# Patient Record
Sex: Female | Born: 1950 | Race: White | Hispanic: No | Marital: Married | Smoking: Never smoker
Health system: Southern US, Community
[De-identification: ages and names within clinical notes are randomized; demographics above are authoritative.]

## PROBLEM LIST (undated history)

## (undated) DIAGNOSIS — E119 Type 2 diabetes mellitus without complications: Secondary | ICD-10-CM

## (undated) DIAGNOSIS — J189 Pneumonia, unspecified organism: Secondary | ICD-10-CM

## (undated) DIAGNOSIS — R945 Abnormal results of liver function studies: Secondary | ICD-10-CM

## (undated) DIAGNOSIS — I10 Essential (primary) hypertension: Secondary | ICD-10-CM

## (undated) DIAGNOSIS — G459 Transient cerebral ischemic attack, unspecified: Secondary | ICD-10-CM

## (undated) DIAGNOSIS — R7989 Other specified abnormal findings of blood chemistry: Secondary | ICD-10-CM

## (undated) DIAGNOSIS — IMO0002 Reserved for concepts with insufficient information to code with codable children: Secondary | ICD-10-CM

## (undated) DIAGNOSIS — R51 Headache: Secondary | ICD-10-CM

## (undated) DIAGNOSIS — R4701 Aphasia: Secondary | ICD-10-CM

## (undated) DIAGNOSIS — R112 Nausea with vomiting, unspecified: Secondary | ICD-10-CM

## (undated) DIAGNOSIS — Z9889 Other specified postprocedural states: Secondary | ICD-10-CM

## (undated) DIAGNOSIS — M436 Torticollis: Secondary | ICD-10-CM

## (undated) DIAGNOSIS — R252 Cramp and spasm: Secondary | ICD-10-CM

## (undated) DIAGNOSIS — E6609 Other obesity due to excess calories: Secondary | ICD-10-CM

## (undated) DIAGNOSIS — E7800 Pure hypercholesterolemia, unspecified: Secondary | ICD-10-CM

## (undated) HISTORY — DX: Other specified abnormal findings of blood chemistry: R79.89

## (undated) HISTORY — DX: Other obesity due to excess calories: E66.09

## (undated) HISTORY — PX: ANKLE FRACTURE SURGERY: SHX122

## (undated) HISTORY — DX: Torticollis: M43.6

## (undated) HISTORY — PX: KNEE ARTHROSCOPY: SHX127

## (undated) HISTORY — DX: Reserved for concepts with insufficient information to code with codable children: IMO0002

## (undated) HISTORY — PX: BREAST BIOPSY: SHX20

## (undated) HISTORY — DX: Aphasia: R47.01

## (undated) HISTORY — PX: LAPAROSCOPY: SHX197

## (undated) HISTORY — DX: Essential (primary) hypertension: I10

## (undated) HISTORY — PX: TONSILLECTOMY: SUR1361

## (undated) HISTORY — PX: DILATION AND CURETTAGE OF UTERUS: SHX78

## (undated) HISTORY — DX: Cramp and spasm: R25.2

## (undated) HISTORY — PX: PILONIDAL CYST EXCISION: SHX744

## (undated) HISTORY — DX: Abnormal results of liver function studies: R94.5

## (undated) HISTORY — DX: Pure hypercholesterolemia, unspecified: E78.00

---

## 1998-04-08 ENCOUNTER — Other Ambulatory Visit: Admission: RE | Admit: 1998-04-08 | Discharge: 1998-04-08 | Payer: Self-pay | Admitting: Obstetrics and Gynecology

## 1999-02-06 HISTORY — PX: ANTERIOR CERVICAL DECOMP/DISCECTOMY FUSION: SHX1161

## 1999-02-23 ENCOUNTER — Encounter: Admission: RE | Admit: 1999-02-23 | Discharge: 1999-05-24 | Payer: Self-pay | Admitting: Gynecology

## 1999-04-10 ENCOUNTER — Other Ambulatory Visit: Admission: RE | Admit: 1999-04-10 | Discharge: 1999-04-10 | Payer: Self-pay | Admitting: Obstetrics and Gynecology

## 1999-10-25 ENCOUNTER — Encounter: Admission: RE | Admit: 1999-10-25 | Discharge: 1999-10-25 | Payer: Self-pay | Admitting: Orthopedic Surgery

## 1999-10-25 ENCOUNTER — Encounter: Payer: Self-pay | Admitting: Orthopedic Surgery

## 1999-11-17 ENCOUNTER — Encounter: Payer: Self-pay | Admitting: Neurosurgery

## 1999-11-17 ENCOUNTER — Ambulatory Visit (HOSPITAL_COMMUNITY): Admission: RE | Admit: 1999-11-17 | Discharge: 1999-11-18 | Payer: Self-pay | Admitting: Neurosurgery

## 1999-11-28 ENCOUNTER — Encounter: Payer: Self-pay | Admitting: Neurosurgery

## 1999-11-28 ENCOUNTER — Encounter: Admission: RE | Admit: 1999-11-28 | Discharge: 1999-11-28 | Payer: Self-pay | Admitting: Neurosurgery

## 2000-01-09 ENCOUNTER — Encounter: Admission: RE | Admit: 2000-01-09 | Discharge: 2000-01-09 | Payer: Self-pay | Admitting: Neurosurgery

## 2000-01-09 ENCOUNTER — Encounter: Payer: Self-pay | Admitting: Neurosurgery

## 2000-04-01 ENCOUNTER — Encounter: Admission: RE | Admit: 2000-04-01 | Discharge: 2000-04-01 | Payer: Self-pay | Admitting: Neurosurgery

## 2000-04-01 ENCOUNTER — Encounter: Payer: Self-pay | Admitting: Neurosurgery

## 2000-04-18 ENCOUNTER — Other Ambulatory Visit: Admission: RE | Admit: 2000-04-18 | Discharge: 2000-04-18 | Payer: Self-pay | Admitting: Obstetrics and Gynecology

## 2000-05-14 ENCOUNTER — Encounter: Payer: Self-pay | Admitting: Neurosurgery

## 2000-05-14 ENCOUNTER — Encounter: Admission: RE | Admit: 2000-05-14 | Discharge: 2000-05-14 | Payer: Self-pay | Admitting: Neurosurgery

## 2001-05-30 ENCOUNTER — Encounter: Payer: Self-pay | Admitting: Orthopedic Surgery

## 2001-05-30 ENCOUNTER — Encounter: Payer: Self-pay | Admitting: Emergency Medicine

## 2001-05-30 ENCOUNTER — Inpatient Hospital Stay (HOSPITAL_COMMUNITY): Admission: EM | Admit: 2001-05-30 | Discharge: 2001-05-31 | Payer: Self-pay | Admitting: Emergency Medicine

## 2001-12-22 ENCOUNTER — Other Ambulatory Visit: Admission: RE | Admit: 2001-12-22 | Discharge: 2001-12-22 | Payer: Self-pay | Admitting: *Deleted

## 2002-06-29 HISTORY — PX: CARDIOVASCULAR STRESS TEST: SHX262

## 2003-02-17 ENCOUNTER — Other Ambulatory Visit: Admission: RE | Admit: 2003-02-17 | Discharge: 2003-02-17 | Payer: Self-pay | Admitting: *Deleted

## 2004-03-02 ENCOUNTER — Other Ambulatory Visit: Admission: RE | Admit: 2004-03-02 | Discharge: 2004-03-02 | Payer: Self-pay | Admitting: *Deleted

## 2004-05-24 ENCOUNTER — Ambulatory Visit (HOSPITAL_COMMUNITY): Admission: RE | Admit: 2004-05-24 | Discharge: 2004-05-24 | Payer: Self-pay | Admitting: Gastroenterology

## 2004-08-28 ENCOUNTER — Encounter: Admission: RE | Admit: 2004-08-28 | Discharge: 2004-08-28 | Payer: Self-pay | Admitting: *Deleted

## 2005-02-05 HISTORY — PX: BREAST BIOPSY: SHX20

## 2005-04-24 ENCOUNTER — Other Ambulatory Visit: Admission: RE | Admit: 2005-04-24 | Discharge: 2005-04-24 | Payer: Self-pay | Admitting: Obstetrics & Gynecology

## 2005-07-17 ENCOUNTER — Encounter: Admission: RE | Admit: 2005-07-17 | Discharge: 2005-07-17 | Payer: Self-pay | Admitting: Obstetrics & Gynecology

## 2005-09-11 ENCOUNTER — Encounter: Admission: RE | Admit: 2005-09-11 | Discharge: 2005-09-11 | Payer: Self-pay | Admitting: Obstetrics & Gynecology

## 2005-09-21 ENCOUNTER — Encounter: Admission: RE | Admit: 2005-09-21 | Discharge: 2005-09-21 | Payer: Self-pay | Admitting: Obstetrics & Gynecology

## 2005-09-26 ENCOUNTER — Encounter: Admission: RE | Admit: 2005-09-26 | Discharge: 2005-09-26 | Payer: Self-pay | Admitting: Obstetrics & Gynecology

## 2005-09-26 ENCOUNTER — Encounter (INDEPENDENT_AMBULATORY_CARE_PROVIDER_SITE_OTHER): Payer: Self-pay | Admitting: Specialist

## 2006-04-01 ENCOUNTER — Encounter: Admission: RE | Admit: 2006-04-01 | Discharge: 2006-04-01 | Payer: Self-pay | Admitting: Obstetrics & Gynecology

## 2006-05-10 ENCOUNTER — Other Ambulatory Visit: Admission: RE | Admit: 2006-05-10 | Discharge: 2006-05-10 | Payer: Self-pay | Admitting: *Deleted

## 2006-11-07 ENCOUNTER — Encounter: Admission: RE | Admit: 2006-11-07 | Discharge: 2006-11-07 | Payer: Self-pay | Admitting: Obstetrics & Gynecology

## 2007-05-16 ENCOUNTER — Encounter: Admission: RE | Admit: 2007-05-16 | Discharge: 2007-05-16 | Payer: Self-pay | Admitting: Obstetrics & Gynecology

## 2007-05-26 ENCOUNTER — Other Ambulatory Visit: Admission: RE | Admit: 2007-05-26 | Discharge: 2007-05-26 | Payer: Self-pay | Admitting: Obstetrics and Gynecology

## 2007-12-18 ENCOUNTER — Encounter: Admission: RE | Admit: 2007-12-18 | Discharge: 2007-12-18 | Payer: Self-pay | Admitting: Obstetrics & Gynecology

## 2008-01-13 ENCOUNTER — Encounter: Admission: RE | Admit: 2008-01-13 | Discharge: 2008-01-13 | Payer: Self-pay | Admitting: Orthopedic Surgery

## 2008-06-08 ENCOUNTER — Other Ambulatory Visit: Admission: RE | Admit: 2008-06-08 | Discharge: 2008-06-08 | Payer: Self-pay | Admitting: Obstetrics & Gynecology

## 2008-12-22 ENCOUNTER — Encounter: Admission: RE | Admit: 2008-12-22 | Discharge: 2008-12-22 | Payer: Self-pay | Admitting: Obstetrics & Gynecology

## 2009-12-14 ENCOUNTER — Ambulatory Visit: Payer: Self-pay | Admitting: Cardiology

## 2009-12-16 ENCOUNTER — Ambulatory Visit: Payer: Self-pay | Admitting: Cardiology

## 2010-01-24 ENCOUNTER — Encounter
Admission: RE | Admit: 2010-01-24 | Discharge: 2010-01-24 | Payer: Self-pay | Source: Home / Self Care | Attending: Obstetrics & Gynecology | Admitting: Obstetrics & Gynecology

## 2010-02-26 ENCOUNTER — Encounter: Payer: Self-pay | Admitting: Obstetrics & Gynecology

## 2010-05-11 ENCOUNTER — Other Ambulatory Visit: Payer: Self-pay | Admitting: *Deleted

## 2010-05-11 DIAGNOSIS — G4700 Insomnia, unspecified: Secondary | ICD-10-CM

## 2010-05-11 MED ORDER — ZOLPIDEM TARTRATE 10 MG PO TABS: ORAL_TABLET | ORAL | Status: DC

## 2010-05-11 NOTE — Telephone Encounter (Signed)
Refilled meds per fax request. Faxed back 

## 2010-06-23 NOTE — H&P (Signed)
[place]  Patient:    Christina Wong, Christina Wong                       MRN: [id number] Adm. Date:  [id number] Disc. Date: [id number] Attending:  Coletta Memos                         History and Physical  ADMISSION DIAGNOSES:   Displaced disk, C6-7, left, spondylosis without myelopathy, left C7 radiculopathy.  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  She has had pain in the left upper extremity and neck for the last three to four months.  The pain did not subside since its onset.  It involved the entire left upper extremity. Numbness most noticeably in the index finger and also in the middle finger on the left side, none on the right side.  No problems on the right side of her neck.  She has had no bowel or bladder difficulties.  She has had physical therapy which included traction and ultrasound without pain relief.  She has taken Vicodin and Vioxx for this without relief.  PAST MEDICAL HISTORY:  Excellent.  She has had no previous operations.  ALLERGIES:  She does not have drug allergies.  MEDICATIONS:  Zyrtec, Nasonex, Vioxx and Vicodin.  FAMILY HISTORY:  Mother, age 40, is in good health and has heart problems. Father is deceased.  Diabetes, hypertension and cerebrovascular accidents are present in her family history.  SOCIAL HISTORY:  She does not smoke.  She does drink socially and does not use illicit drugs.  She is 5 feet 4 inches tall and weighs 190 pounds with a pulse of 92.  REVIEW OF SYSTEMS:  Positive for arm weakness.  She denies constitutional, eye, ears, nose, throat, mouth, cardiovascular, respiratory, gastrointestinal, genitourinary, skin, neurological, psychiatric, endocrine, hematologic and allergic problems.  PHYSICAL EXAMINATION:  NEUROLOGIC:  She is alert and oriented x4 and answers all questions appropriately.  Memory, language and attention span are normal.   She is well kempt and in no distress.  Cranial nerves II-XII are normal.  Normal strength in her right upper extremity and both lower extremities.  Weakness at the left triceps and wrist extensors at 4+/5.  Deep tendon reflexes are 3+ at the biceps, brachioradialis and right triceps, 1+ at the left triceps, 2+ at the knees and ankles.  No clonus and minimal Hoffman sign on the right side and none on the left.  Toes are downgoing in a plantar simulation.  She has a negative Romberg status.  She can tandem walk easily. Gait is normal.  Muscle tone, bulk and coordination are normal.  NECK:  No cervical masses or bruits are identified.  LUNGS:  Lung fields are clear bilaterally.  HEART:  Regular rate and rhythm, no murmurs or rubs.  Pulses are good at the wrists and feet bilaterally.  LABORATORY DATA:  MRI:  Shows a displaced disk on the left side of C6-7. She has some spondylitic changes associated with the disk.  It causes foraminal stenosis at the left C7 nerve root entrance.  The rest of the neck looks quite good.  No other herniated disks nor abnormalities in the spinal cord, nor is there any cord compression.  Christina Wong has tried conservative treatment without success.  She has given this problem also enough time without there being improvement.  My  recommendation therefore is for operative decompression via an anterior cervical discectomy and arthrodesis.  We discussed steroids and Christina Wong did try a steroid taper.  She did not gain significant improvement from that. She does have profound weakness and I think in that sense she should get an operation much sooner than later.  We discussed the risks and benefits of an anterior cervical discectomy and arthrodesis that included bleeding, infection, no pain relief, bowel or bladder dysfunction, paralysis, arm weakness, leg weakness, fusion failure, hardware failure, recurring laryngeal nerve injury which causes hoarseness.  She  understands and wishes to proceed. D:  11/17/99 TD:  11/17/99 Job: [id number]

## 2010-06-23 NOTE — Op Note (Signed)
[place]  Patient:    [name], [name]                       MRN: [id number] Adm. Date:  [id number] Disc. Date: [id number] Attending:  Coletta Memos                           Operative Report  PREOPERATIVE DIAGNOSIS:  Displaced disk, C6-7.  PREOPERATIVE DIAGNOSES: 1. Spondylosis with displaced disk at C6-7 on the left. 2. Left C7 radiculopathy.  SURGEON:  Kyle L. Franky Macho, M.D.  ASSISTANT:  Danae Orleans. Venetia Maxon, M.D.  PROCEDURES: 1. Anterior cervical diskectomy, C6-7. 2. Arthrodesis of C6-7 with 7 mm allograft and 18 mm CLSP Synthes plate with    three 4 x 14 mm self-drilling screws and one 4 x 4.5 mm 14 mm screw.  COMPLICATIONS:  None.  INDICATIONS:  Christina Wong is a ______ with left upper extremity pain.  An MRI showed a displaced disk at C6-7 on the left side.  She had already gone through cervical traction and physical therapy and conservative over the last three months.  It did not improve and therefore recommended and she agreed to undergo anterior cervical diskectomy and arthrodesis.  OPERATIVE NOTE:  Christina Wong was brought to the operating room, intubated, and placed under general anesthesia without difficulty.  Her neck was prepped and she was draped in a sterile fashion.  Infiltrated the proposed skin incision with 0.5% lidocaine and 1:200,000 strength epinephrine.  I made a skin incision was a #10 blade and took this down to the platysma.  I then dissected with Metzenbaum scissors both proximally and caudally above the platysma.  I opened the platysma in a horizontal fashion.  I then dissected underneath the platysma both rostrally and caudally.  I was able to retract the medial structures medially and the sternocleidomastoid laterally.  I felt the anterior cervical spine and then used hand-held retractors.  In addition to that, I removed soft tissue using a peanut sponge.  When it adequately exposed the anterior cervical spine, I  placed the needle in what I thought was C6-7. X-ray was quite difficult as she had a large neck and it was unclear where the needle was.  So I placed the needle at the level above.  When another x-ray was shown to be a C5-6, I therefore knew that this needed to go into where the original actually was placed.  I then opened the disk with a #15 blade and took out disk material at C6-7.  I initially grasped the fascia using pituitary rongeurs, curets, and Kerrison punches.  I placed a distracting pin at C6 and one at C7, distracted the disk space, brought the microscope into position, and continued with the procedure.  She had a significant amount of spondylitic disease present at C6-7 and I used the drill to remove bone spurs along with the Kerrison to remove bone spurs.  I removed a very thickened posterior longitudinal ligament after opening of the nerve with Kerrison punches.  I then decompressed the C7 nerve roots bilaterally in a generous fashion.  When I felt that there was adequate decompression, I then placed a 7 mm bone graft.  I placed a small Synthes plate initially using four 14 mm locking screws.  I took an x-ray, but there was a significant of oozing coming from the incision.  When I explored it again using hand-held  retractors, it appeared to be coming from the disk space.  At that point, I removed that plate and inspected the disk space.  There was no arterial bleeding, it was just venous ooze coming mainly from the bone.  I used a bit of bone wax and some Gelfoam.  In time, the bleeding stopped.  When I attempted to place the same plate back on, the inferior left-sided screw no longer had good purchase. I then had to use another plate.  I tried first a ______, but that also did not obtain good purchase.  I then used a different plate, the CLSP plate with self-drilling screws, 18 mm.  This plate was secured well.  I took an x-ray and it showed the plate to be in good position  as was the bone plug.  I irrigated the wound and then closed the wound in layered fashion using Vicryl sutures.  A sterile dressing was applied as were Steri-Strips for the skin. She tolerated the procedure well. DD:  11/17/99 TD:  11/19/99 Job: [id number]

## 2010-06-23 NOTE — Op Note (Signed)
[facility]  Patient:    Christina Wong, Christina Wong Visit Number: [id number] MRN: [id number]          Service Type: SUR Location: 4W [id number] 01 Attending Physician:  Cornell Barman Dictated by:   Lenard Galloway Chaney Malling, M.D. Proc. Date: 05/30/01 Admit Date:  05/30/2001 Discharge Date: 05/31/2001                             Operative Report  PREOPERATIVE DIAGNOSIS:  _______ medial malleolar fracture left ankle.  POSTOPERATIVE DIAGNOSIS:  _______ medial malleolar fracture left ankle.  OPERATION:  Complicated open reduction internal fixation primary medial malleolar fracture left ankle using a lateral six-hole sideplate; two cannulated medial malleolar screws and a large anteroposterior screw into the posterior tibial fragment.  SURGEON:  Lenard Galloway. Chaney Malling, M.D.  ANESTHESIA:  General.  DESCRIPTION OF PROCEDURE:  The patient was placed on the operating room table in supine position. _______ tourniquet about the left upper thigh. The entire left lower extremity was prepped with Duraprep and draped out in the usual manner. The leg was then wrapped up in Esmarch and tourniquet was elevated. Incision made on the medial malleolus and carried proximally. The medial malleolar fracture was seen and this was displaced. The ankle was extremely unstable. Multiple small fragments were seen about the medial malleolus and these were extracted. Bony fragments were seen in the ankle and these were removed. There was a huge posterior lip fracture which represented at least 30% to 40% of the joint. This was markedly displaced proximally. Posterior tibial tendon was identified, isolated, and stripped off the posterior malleolar fragment. A fragment could be elevated with traction on the ankle. The fragment could be brought down into an anatomic position. This was held in anatomic position and a guidepin was placed from anterior to posterior through the posterior lip  fragment. This was then drilled and appropriate length cannulated screw was passed over the fragment and tightened up. Once this was done, this stabilized the posterior fragment beautifully and there was an anatomic reduction of the roof of the distal talus. Again, the ankle was irrigated with copious amounts of antibiotic solution. Debris was removed. The medial malleolar fracture was then reduced anatomically. Two guidepins were placed across the fracture site and appropriate length cannulated screws were inserted. This was all done under radiographic control using the C-arm. The rent in the posterior tibial tendon sheath was then repaired. Attention was then turned to the lateral side of the ankle. _______ incision made starting above the fracture _______ the fibula and carried down to the tip of the fibula. Skin edges were retracted. Great care was taken to avoid injury to the superficial cutaneous nerves. The fracture was seen. This was reduced anatomically and held in position with a bone clamp. A six-hole sideplate was placed over the lateral side of the fibula. Drill hole was placed and appropriate length screws were applied. This gave excellent stabilization and immobilization to the fracture. This was also held in anatomic position. X-rays were taken once again and anatomic reduction of all three fractures was accomplished very nicely. The wound was irrigated with copious amounts of antibiotic solution again. Vicryl was used to close the subcutaneous tissue and stainless steel staples used to close the skin. Large bulky pressure dressing and a sugar tong splint was applied and the patient returned to recovery in excellent condition.  DRAINS:  None.  COMPLICATIONS:  None. Dictated by:  Rodney A. Chaney Malling, M.D. Attending Physician:  Cornell Barman DD:  05/30/01 TD:  05/31/01 Job: [id number]

## 2010-06-23 NOTE — Op Note (Signed)
NAME:  Christina Wong, Christina Wong                ACCOUNT NO.:  [id number]   MEDICAL RECORD NO.:  [id number]          PATIENT TYPE:  AMB   LOCATION:  ENDO                         FACILITY:  [facility]   PHYSICIAN:  Anselmo Rod, M.D.  DATE OF BIRTH:  05-09-1950   DATE OF PROCEDURE:  05/24/2004  DATE OF DISCHARGE:                                 OPERATIVE REPORT   PROCEDURE PERFORMED:  Screening colonoscopy.   ENDOSCOPIST:  Charna Elizabeth, M.D.   INSTRUMENT USED:  Olympus video colonoscope.   INDICATIONS FOR PROCEDURE:  A 60 year old white female undergoing screening  colonoscopy.  Rule out colonic polyps, masses, etc.   PREPROCEDURE PREPARATION:  Informed consent was procured from the patient.  The patient was fasted for eight hours prior to the procedure and prepped  with a bottle of magnesium citrate and a gallon of GoLYTELY the night prior  to the procedure.  The risks and benefits of the procedure including a 10%  miss rate of cancer and polyps was discussed with the patient as well.   PREPROCEDURE PHYSICAL:  The patient had stable vital signs.  Neck supple.  Chest clear to auscultation.  S1 and S2 regular.  Abdomen soft with normal  bowel sounds.   DESCRIPTION OF PROCEDURE:  The patient was placed in left lateral decubitus  position and sedated with 80 mg of Demerol and 7.5 mg of Versed in slow  incremental doses.  Once the patient was adequately sedated and maintained  on low flow oxygen and continuous cardiac monitoring, the Olympus video  colonoscope was advanced from the rectum to the cecum.  The appendicular  orifice and ileocecal valve were clearly visualized and photographed.  There  was some stool in the right colon.  Multiple washes were done. The terminal  ileum appeared healthy and without lesions.  Retroflexion in the rectum  showed small internal hemorrhoids.  No masses, polyps, erosions, ulcerations  or diverticula were seen.  The patient tolerated the procedure well without  complication.   IMPRESSION:  1.  Normal colonoscopy up to the terminal ileum except for small internal      hemorrhoids.  2.  No masses, polyps, or diverticula were seen.   RECOMMENDATIONS:  1.  Continue high fiber diet with liberal fluid intake.  2.  Repeat colonoscopy is recommended in the next five years unless the      patient develops any abnormal symptoms in the interim.  3.  Outpatient followup as need arises in the future.      JNM/MEDQ  D:  05/24/2004  T:  05/24/2004  Job:  [id number]   cc:   Andres Ege, M.D.  [address]  [place]  [place] [address]  Fax: [phone]   Elana Alm. Nicholos Johns, M.D.  [address]  [place]  [place] [address]  Fax: [phone]   Cassell Clement, M.D.  [address], [address]  [place]  [place] [address]  Fax: [phone]

## 2010-08-15 ENCOUNTER — Other Ambulatory Visit: Payer: Self-pay | Admitting: *Deleted

## 2010-08-15 ENCOUNTER — Encounter: Payer: Self-pay | Admitting: *Deleted

## 2010-08-15 DIAGNOSIS — E785 Hyperlipidemia, unspecified: Secondary | ICD-10-CM

## 2010-08-16 ENCOUNTER — Other Ambulatory Visit (INDEPENDENT_AMBULATORY_CARE_PROVIDER_SITE_OTHER): Payer: Self-pay | Admitting: *Deleted

## 2010-08-16 DIAGNOSIS — E785 Hyperlipidemia, unspecified: Secondary | ICD-10-CM

## 2010-08-16 LAB — BASIC METABOLIC PANEL
BUN: 17 mg/dL (ref 6–23)
CO2: 31 mEq/L (ref 19–32)
Calcium: 9.6 mg/dL (ref 8.4–10.5)
Chloride: 103 mEq/L (ref 96–112)
Creatinine, Ser: 0.7 mg/dL (ref 0.4–1.2)
GFR: 90.51 mL/min (ref >60.00)
Glucose, Bld: 115 mg/dL — ABNORMAL HIGH (ref 70–99)
Potassium: 3.5 mEq/L (ref 3.5–5.1)
Sodium: 140 mEq/L (ref 135–145)

## 2010-08-16 LAB — HEPATIC FUNCTION PANEL
ALT: 23 U/L (ref 0–35)
AST: 24 U/L (ref 0–37)
Albumin: 4.2 g/dL (ref 3.5–5.2)
Alkaline Phosphatase: 64 U/L (ref 39–117)
Bilirubin, Direct: 0.1 mg/dL (ref 0.0–0.3)
Total Bilirubin: 0.6 mg/dL (ref 0.3–1.2)
Total Protein: 6.5 g/dL (ref 6.0–8.3)

## 2010-08-16 LAB — LIPID PANEL
Cholesterol: 134 mg/dL (ref 0–200)
HDL: 50.6 mg/dL (ref >39.00)
LDL Cholesterol: 61 mg/dL (ref 0–99)
Total CHOL/HDL Ratio: 3
Triglycerides: 113 mg/dL (ref 0.0–149.0)
VLDL: 22.6 mg/dL (ref 0.0–40.0)

## 2010-08-17 ENCOUNTER — Encounter: Payer: Self-pay | Admitting: Cardiology

## 2010-08-21 ENCOUNTER — Other Ambulatory Visit: Payer: Self-pay | Admitting: *Deleted

## 2010-08-23 ENCOUNTER — Ambulatory Visit (INDEPENDENT_AMBULATORY_CARE_PROVIDER_SITE_OTHER): Payer: Commercial Managed Care - PPO | Admitting: Cardiology

## 2010-08-23 ENCOUNTER — Encounter: Payer: Self-pay | Admitting: Cardiology

## 2010-08-23 VITALS — BP 110/74 | HR 74 | Wt 211.0 lb

## 2010-08-23 DIAGNOSIS — E119 Type 2 diabetes mellitus without complications: Secondary | ICD-10-CM

## 2010-08-23 DIAGNOSIS — E785 Hyperlipidemia, unspecified: Secondary | ICD-10-CM

## 2010-08-23 DIAGNOSIS — IMO0001 Reserved for inherently not codable concepts without codable children: Secondary | ICD-10-CM | POA: Insufficient documentation

## 2010-08-23 DIAGNOSIS — I119 Hypertensive heart disease without heart failure: Secondary | ICD-10-CM | POA: Insufficient documentation

## 2010-08-23 DIAGNOSIS — E7800 Pure hypercholesterolemia, unspecified: Secondary | ICD-10-CM

## 2010-08-23 NOTE — Assessment & Plan Note (Signed)
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 Recently she's also been under a great deal of stress caring for her aged mother who has had open heart surgery in [facility]. The patient has had to spend the last several months in [place] basically staying at the hospital 24/7. The patient has not been having any chest pain or shortness of breath. She has not been able to get any regular exercise and her weight is up 10 pounds.

## 2010-08-23 NOTE — Assessment & Plan Note (Signed)
The patient has a long history of hypercholesterolemia. She has been on Crestor 10 mg daily. She's not having any problems with myalgias. Her lipids were reviewed today and are satisfactory

## 2010-08-23 NOTE — Assessment & Plan Note (Signed)
The patient has a history of adult-onset diabetes mellitus. Blood sugars run slightly above normal and her most recent hemoglobin A1c was 5.7. She is not having any episodes of hypoglycemia.

## 2010-08-23 NOTE — Progress Notes (Signed)
Christina Wong, Christina Wong [facility] / [facility] [address]   [address] [place]   Current Outpatient Prescriptions  Medication Sig Dispense Refill  . B Complex Vitamins (B COMPLEX PO) Take by mouth.        Marland Kitchen CALCIUM PO Take 600 mg by mouth 2 (two) times daily.        . Cholecalciferol (VITAMIN D PO) Take by mouth 2 (two) times daily.        . Ibuprofen (ADVIL PO) Take by mouth.        . rosuvastatin (CRESTOR) 10 MG tablet Take 10 mg by mouth daily.        . Saxagliptin-Metformin (KOMBIGLYZE XR) 06-998 MG TB24 Take by mouth daily.        . traMADol (ULTRAM) 50 MG tablet Take 50 mg by mouth every 6 (six) hours as needed.        . valsartan-hydrochlorothiazide (DIOVAN-HCT) 160-25 MG per tablet Take 1 tablet by mouth daily.        Marland Kitchen zolpidem (AMBIEN) 10 MG tablet 1/2 to 1 at bedtime as needed.  30 tablet  5  . Saxagliptin HCl (ONGLYZA PO) Take 1,000 mg by mouth.          Allergies  Allergen Reactions  . Sulfa Antibiotics     Patient Active Problem List  Diagnoses  . Diabetes mellitus  . Dyslipidemia  . Benign hypertensive heart disease without heart failure    History  Smoking status  . Never Smoker   Smokeless tobacco  . Not on file    History  Alcohol Use  . Yes    Wine Occ.    Family History  Problem Relation Age of Onset  . Diabetes Father     Review of Systems: The patient denies any heat or cold intolerance.  No weight gain or weight loss.  The patient denies headaches or blurry vision.  There is no cough or sputum production.  The patient denies dizziness.  There is no hematuria or hematochezia.  The patient denies any muscle aches or arthritis.  The patient denies any rash.  The patient denies frequent falling or instability.  There is no history of depression or anxiety.  All other systems were reviewed and are negative.   Physical Exam: Filed Vitals:   08/23/10 1552  BP: 110/74  Pulse: 74      Assessment /  Plan:

## 2010-08-23 NOTE — Progress Notes (Signed)
Christina Wong Date of Birth:  1950/07/04 [facility] Cardiology / [facility] [address]   [address] [place], [place]  [address] [phone]           Fax   [phone]  HPI: This pleasant 60 year old woman is seen for a scheduled six-month followup office visit. He has a history of essential hypertension, hypercholesterolemia, and mild diabetes. She has also had a long history of exogenous obesity. She does not have any history of ischemic heart disease. She had a normal nuclear stress test on 06/29/02 and her ejection fraction was 55%. She denies any history of chest pain or increased shortness of breath. She's not having any dizziness or syncope or palpitations. She has been under more recent stress for the past 3 months. Her aged mother has been a patient at [facility] and has had multiple complications following a redo aortic valve replacement. The patient is considering bringing her mother to [place] to a nursing home here after she stabilizes further there.  Current Outpatient Prescriptions  Medication Sig Dispense Refill  . B Complex Vitamins (B COMPLEX PO) Take by mouth.        Marland Kitchen CALCIUM PO Take 600 mg by mouth 2 (two) times daily.        . Cholecalciferol (VITAMIN D PO) Take by mouth 2 (two) times daily.        . Ibuprofen (ADVIL PO) Take by mouth.        . rosuvastatin (CRESTOR) 10 MG tablet Take 10 mg by mouth daily.        . Saxagliptin-Metformin (KOMBIGLYZE XR) 06-998 MG TB24 Take by mouth daily.        . traMADol (ULTRAM) 50 MG tablet Take 50 mg by mouth every 6 (six) hours as needed.        . valsartan-hydrochlorothiazide (DIOVAN-HCT) 160-25 MG per tablet Take 1 tablet by mouth daily.        Marland Kitchen zolpidem (AMBIEN) 10 MG tablet 1/2 to 1 at bedtime as needed.  30 tablet  5  . Saxagliptin HCl (ONGLYZA PO) Take 1,000 mg by mouth.          Allergies  Allergen Reactions  . Sulfa Antibiotics     Patient Active Problem List  Diagnoses  . Diabetes mellitus   . Dyslipidemia  . Benign hypertensive heart disease without heart failure    History  Smoking status  . Never Smoker   Smokeless tobacco  . Not on file    History  Alcohol Use  . Yes    Wine Occ.    Family History  Problem Relation Age of Onset  . Diabetes Father     Review of Systems: The patient denies any heat or cold intolerance.  No weight gain or weight loss.  The patient denies headaches or blurry vision.  There is no cough or sputum production.  The patient denies dizziness.  There is no hematuria or hematochezia.  The patient denies any muscle aches or arthritis.  The patient denies any rash.  The patient denies frequent falling or instability.  There is no history of depression or anxiety.  All other systems were reviewed and are negative.   Physical Exam: Filed Vitals:   08/23/10 1552  BP: 110/74  Pulse: 74   general appearance reveals a well-developed somewhat obese woman in no acute distress. Her weight is up 10 pounds since her last visit.Pupils equal and reactive.   Extraocular Movements are full.  There  is no scleral icterus.  The mouth and pharynx are normal.  The neck is supple.  The carotids reveal no bruits.  The jugular venous pressure is normal.  The thyroid is not enlarged.  There is no lymphadenopathy.  The chest is clear to percussion and auscultation. There are no rales or rhonchi. Expansion of the chest is symmetrical.  The precordium is quiet.  The first heart sound is normal.  The second heart sound is physiologically split.  There is no murmur gallop rub or click.  There is no abnormal lift or heave.  The abdomen is soft and nontender. Bowel sounds are normal. The liver and spleen are not enlarged. There Are no abdominal masses. There are no bruits.  The pedal pulses are good.  There is no phlebitis or edema.  There is no cyanosis or clubbing. Strength is normal and symmetrical in all extremities.  There is no lateralizing weakness.  There are  no sensory deficits.     the patient will continue on same medication and recheck in 6 months for office visit and fasting lab work

## 2010-08-25 ENCOUNTER — Other Ambulatory Visit: Payer: Self-pay | Admitting: Cardiology

## 2010-08-25 DIAGNOSIS — E785 Hyperlipidemia, unspecified: Secondary | ICD-10-CM

## 2010-08-25 DIAGNOSIS — I119 Hypertensive heart disease without heart failure: Secondary | ICD-10-CM

## 2010-08-25 NOTE — Telephone Encounter (Signed)
escribe request  

## 2011-03-09 ENCOUNTER — Other Ambulatory Visit: Payer: Self-pay | Admitting: Obstetrics & Gynecology

## 2011-03-09 DIAGNOSIS — Z1231 Encounter for screening mammogram for malignant neoplasm of breast: Secondary | ICD-10-CM

## 2011-03-26 ENCOUNTER — Other Ambulatory Visit (INDEPENDENT_AMBULATORY_CARE_PROVIDER_SITE_OTHER): Payer: Commercial Managed Care - PPO | Admitting: *Deleted

## 2011-03-26 DIAGNOSIS — E7800 Pure hypercholesterolemia, unspecified: Secondary | ICD-10-CM

## 2011-03-26 LAB — HEPATIC FUNCTION PANEL
ALT: 32 U/L (ref 0–35)
AST: 26 U/L (ref 0–37)
Albumin: 4.1 g/dL (ref 3.5–5.2)
Alkaline Phosphatase: 68 U/L (ref 39–117)
Bilirubin, Direct: 0.1 mg/dL (ref 0.0–0.3)
Total Bilirubin: 0.6 mg/dL (ref 0.3–1.2)
Total Protein: 6.6 g/dL (ref 6.0–8.3)

## 2011-03-26 LAB — BASIC METABOLIC PANEL
BUN: 14 mg/dL (ref 6–23)
CO2: 33 mEq/L — ABNORMAL HIGH (ref 19–32)
Calcium: 9.8 mg/dL (ref 8.4–10.5)
Chloride: 103 mEq/L (ref 96–112)
Creatinine, Ser: 0.7 mg/dL (ref 0.4–1.2)
GFR: 87.44 mL/min (ref >60.00)
Glucose, Bld: 121 mg/dL — ABNORMAL HIGH (ref 70–99)
Potassium: 3.1 mEq/L — ABNORMAL LOW (ref 3.5–5.1)
Sodium: 141 mEq/L (ref 135–145)

## 2011-03-26 LAB — LIPID PANEL
Cholesterol: 135 mg/dL (ref 0–200)
HDL: 55.1 mg/dL (ref >39.00)
LDL Cholesterol: 54 mg/dL (ref 0–99)
Total CHOL/HDL Ratio: 2
Triglycerides: 130 mg/dL (ref 0.0–149.0)
VLDL: 26 mg/dL (ref 0.0–40.0)

## 2011-03-26 NOTE — Progress Notes (Signed)
Quick Note:  Please make copy of labs for patient visit. ______ 

## 2011-03-29 ENCOUNTER — Ambulatory Visit: Payer: Commercial Managed Care - PPO | Admitting: Cardiology

## 2011-04-02 ENCOUNTER — Ambulatory Visit
Admission: RE | Admit: 2011-04-02 | Discharge: 2011-04-02 | Disposition: A | Discharge status: Home / Self Care | Payer: Commercial Managed Care - PPO | Source: Ambulatory Visit | Attending: Obstetrics & Gynecology | Admitting: Obstetrics & Gynecology

## 2011-04-02 DIAGNOSIS — Z1231 Encounter for screening mammogram for malignant neoplasm of breast: Secondary | ICD-10-CM

## 2011-04-08 ENCOUNTER — Other Ambulatory Visit: Payer: Self-pay | Admitting: Cardiology

## 2011-04-08 DIAGNOSIS — G4700 Insomnia, unspecified: Secondary | ICD-10-CM

## 2011-04-09 ENCOUNTER — Other Ambulatory Visit: Payer: Self-pay | Admitting: *Deleted

## 2011-04-13 ENCOUNTER — Encounter: Payer: Self-pay | Admitting: Cardiology

## 2011-04-13 ENCOUNTER — Ambulatory Visit (INDEPENDENT_AMBULATORY_CARE_PROVIDER_SITE_OTHER): Payer: Commercial Managed Care - PPO | Admitting: Cardiology

## 2011-04-13 VITALS — BP 120/78 | HR 78 | Ht 64.0 in | Wt 199.0 lb

## 2011-04-13 DIAGNOSIS — E119 Type 2 diabetes mellitus without complications: Secondary | ICD-10-CM

## 2011-04-13 DIAGNOSIS — E876 Hypokalemia: Secondary | ICD-10-CM

## 2011-04-13 DIAGNOSIS — E785 Hyperlipidemia, unspecified: Secondary | ICD-10-CM

## 2011-04-13 DIAGNOSIS — I119 Hypertensive heart disease without heart failure: Secondary | ICD-10-CM

## 2011-04-13 DIAGNOSIS — M545 Low back pain, unspecified: Secondary | ICD-10-CM

## 2011-04-13 DIAGNOSIS — E7800 Pure hypercholesterolemia, unspecified: Secondary | ICD-10-CM

## 2011-04-13 NOTE — Assessment & Plan Note (Signed)
The patient's recent fasting lab work is satisfactory in terms of her lipid levels.  However she has been having chronic lower back and upper leg aching discomfort which she thinks may be from the Crestor.  We will leave her Crestor off until next visit.  He may take several months to see definite improvement.  Her weight and have her return in a four-month visit we will plan to recheck her fasting lipid panel nasal metabolic panel and hepatic function panel.  The meantime she will continue to work hard with diet and exercise as a means to control lipids

## 2011-04-13 NOTE — Progress Notes (Signed)
[name] Date of Birth:  29-Sep-1950 [facility] [address] [place], [place]  [address] [phone]  Fax   [phone]  HPI: This pleasant 61 year old woman is seen for a scheduled followup office visit.  She missed her last appointment because she was very sick with a flulike illness.  She was sick for a full 14 days.  There was an upper respiratory infection with cough and severe myalgias and sounds like it could have been influenza even though she did take the flu shot.  She is finally feeling better.  She has a past history of hypercholesterolemia essential hypertension and diabetes.  Since last visit she has lost 12 pounds.  She is getting more exercise now.  She and her husband have been doing some ballroom dancing for exercise.  Current Outpatient Prescriptions  Medication Sig Dispense Refill  . B Complex Vitamins (B COMPLEX PO) Take by mouth.        Marland Kitchen CALCIUM PO Take 600 mg by mouth daily.       . Cholecalciferol (VITAMIN D PO) Take by mouth 2 (two) times daily.        Marland Kitchen DIOVAN HCT 160-25 MG per tablet TAKE 1 TABLET ONCE DAILY.  30 each  11  . Ibuprofen (ADVIL PO) Take by mouth.        . Saxagliptin-Metformin (KOMBIGLYZE XR) 06-998 MG TB24 Take by mouth daily.        . traMADol (ULTRAM) 50 MG tablet Take 50 mg by mouth every 6 (six) hours as needed.        . zolpidem (AMBIEN) 10 MG tablet Take one half tablet at bedtime as needed for insomnia  30 each  5    Allergies  Allergen Reactions  . Sulfa Antibiotics     Patient Active Problem List  Diagnoses  . Diabetes mellitus  . Dyslipidemia  . Benign hypertensive heart disease without heart failure    History  Smoking status  . Never Smoker   Smokeless tobacco  . Not on file    History  Alcohol Use  . Yes    Wine Occ.    Family History  Problem Relation Age of Onset  . Diabetes Father     Review of Systems: The patient denies any heat or cold intolerance.  No weight gain or  weight loss.  The patient denies headaches or blurry vision.  There is no cough or sputum production.  The patient denies dizziness.  There is no hematuria or hematochezia.  The patient denies any muscle aches or arthritis.  The patient denies any rash.  The patient denies frequent falling or instability.  There is no history of depression or anxiety.  All other systems were reviewed and are negative.   Physical Exam: Filed Vitals:   04/13/11 1026  BP: 120/78  Pulse: 78   the general appearance reveals a well-developed well-nourished woman in no distress.The head and neck exam reveals pupils equal and reactive.  Extraocular movements are full.  There is no scleral icterus.  The mouth and pharynx are normal.  The neck is supple.  The carotids reveal no bruits.  The jugular venous pressure is normal.  The  thyroid is not enlarged.  There is no lymphadenopathy.  The chest is clear to percussion and auscultation.  There are no rales or rhonchi.  Expansion of the chest is symmetrical.  The precordium is quiet.  The first heart sound is normal.  The second heart sound  is physiologically split.  There is no murmur gallop rub or click.  There is no abnormal lift or heave.  The abdomen is soft and nontender.  The bowel sounds are normal.  The liver and spleen are not enlarged.  There are no abdominal masses.  There are no abdominal bruits.  Extremities reveal good pedal pulses.  There is no phlebitis or edema.  There is no cyanosis or clubbing.  Strength is normal and symmetrical in all extremities.  There is no lateralizing weakness.  There are no sensory deficits.  The skin is warm and dry.  There is no rash.      Assessment / Plan: Continue same medication except hold Crestor and see if low back pain and leg pain improve.  Recheck in 4 months for followup office visit and fasting lipid panel.  Because of her hypokalemia we will have her return in 2 weeks for a followup basal metabolic panel.  Supplement her  diet with high potassium foods and vegetables.

## 2011-04-13 NOTE — Assessment & Plan Note (Signed)
Patient has a history of essential hypertension and is on Diovan HCT.  Her recent labs show significant hypokalemia with a potassium of 3.1.  She denies any GI blood losses.  The blood work was obtained just that she was starting to get sick with her influenza-like illness.  We will have her supplement her diet with extra fruit and plan to recheck a followup basal metabolic panel in 2 weeks.  If her potassium is still low she may need a supplemental potassium pill.

## 2011-04-13 NOTE — Patient Instructions (Addendum)
Will hold your Crestor for now Your physician wants you to follow-up in: 4 months You will receive a reminder letter in the mail two months in advance. If you don't receive a letter, please call our office to schedule the follow-up appointment. Will recheck labs on 04/27/11 anytime after 9:00 am

## 2011-04-13 NOTE — Assessment & Plan Note (Signed)
The patient has not been having any hypoglycemic episodes.  She reports that her most recent hemoglobin A1c was 5.6.  Her sugar is followed by her primary care provider

## 2011-04-27 ENCOUNTER — Other Ambulatory Visit: Payer: Commercial Managed Care - PPO

## 2011-08-14 ENCOUNTER — Encounter: Payer: Self-pay | Admitting: Cardiology

## 2011-08-28 ENCOUNTER — Ambulatory Visit (INDEPENDENT_AMBULATORY_CARE_PROVIDER_SITE_OTHER): Payer: Commercial Managed Care - PPO | Admitting: *Deleted

## 2011-08-28 DIAGNOSIS — E876 Hypokalemia: Secondary | ICD-10-CM

## 2011-08-28 DIAGNOSIS — I119 Hypertensive heart disease without heart failure: Secondary | ICD-10-CM

## 2011-08-28 LAB — HEPATIC FUNCTION PANEL
ALT: 23 U/L (ref 0–35)
AST: 20 U/L (ref 0–37)
Albumin: 4.1 g/dL (ref 3.5–5.2)
Alkaline Phosphatase: 53 U/L (ref 39–117)
Bilirubin, Direct: 0 mg/dL (ref 0.0–0.3)
Total Bilirubin: 0.8 mg/dL (ref 0.3–1.2)
Total Protein: 6.8 g/dL (ref 6.0–8.3)

## 2011-08-28 LAB — BASIC METABOLIC PANEL
BUN: 22 mg/dL (ref 6–23)
CO2: 30 mEq/L (ref 19–32)
Calcium: 9.8 mg/dL (ref 8.4–10.5)
Chloride: 102 mEq/L (ref 96–112)
Creatinine, Ser: 0.7 mg/dL (ref 0.4–1.2)
GFR: 93.27 mL/min (ref >60.00)
Glucose, Bld: 115 mg/dL — ABNORMAL HIGH (ref 70–99)
Potassium: 3.4 mEq/L — ABNORMAL LOW (ref 3.5–5.1)
Sodium: 139 mEq/L (ref 135–145)

## 2011-08-28 LAB — CBC WITH DIFFERENTIAL/PLATELET
Basophils Absolute: 0 10*3/uL (ref 0.0–0.1)
Basophils Relative: 0.5 % (ref 0.0–3.0)
Eosinophils Absolute: 0.2 10*3/uL (ref 0.0–0.7)
Eosinophils Relative: 3.3 % (ref 0.0–5.0)
HCT: 41 % (ref 36.0–46.0)
Hemoglobin: 13.7 g/dL (ref 12.0–15.0)
Lymphocytes Relative: 18.8 % (ref 12.0–46.0)
Lymphs Abs: 1.3 10*3/uL (ref 0.7–4.0)
MCHC: 33.5 g/dL (ref 30.0–36.0)
MCV: 88 fl (ref 78.0–100.0)
Monocytes Absolute: 0.5 10*3/uL (ref 0.1–1.0)
Monocytes Relative: 7.2 % (ref 3.0–12.0)
Neutro Abs: 4.7 10*3/uL (ref 1.4–7.7)
Neutrophils Relative %: 70.2 % (ref 43.0–77.0)
Platelets: 152 10*3/uL (ref 150.0–400.0)
RBC: 4.65 Mil/uL (ref 3.87–5.11)
RDW: 13.3 % (ref 11.5–14.6)
WBC: 6.7 10*3/uL (ref 4.5–10.5)

## 2011-08-28 LAB — LIPID PANEL
Cholesterol: 230 mg/dL — ABNORMAL HIGH (ref 0–200)
HDL: 55.1 mg/dL (ref >39.00)
Total CHOL/HDL Ratio: 4
Triglycerides: 115 mg/dL (ref 0.0–149.0)
VLDL: 23 mg/dL (ref 0.0–40.0)

## 2011-08-28 LAB — LDL CHOLESTEROL, DIRECT: Direct LDL: 158.8 mg/dL

## 2011-08-28 NOTE — Progress Notes (Signed)
Quick Note:  Please make copy of labs for patient visit. ______ 

## 2011-08-30 ENCOUNTER — Ambulatory Visit (INDEPENDENT_AMBULATORY_CARE_PROVIDER_SITE_OTHER): Payer: Commercial Managed Care - PPO | Admitting: Cardiology

## 2011-08-30 ENCOUNTER — Encounter: Payer: Self-pay | Admitting: Cardiology

## 2011-08-30 VITALS — BP 118/78 | HR 76 | Ht 64.0 in | Wt 197.0 lb

## 2011-08-30 DIAGNOSIS — I119 Hypertensive heart disease without heart failure: Secondary | ICD-10-CM

## 2011-08-30 DIAGNOSIS — E785 Hyperlipidemia, unspecified: Secondary | ICD-10-CM

## 2011-08-30 MED ORDER — POTASSIUM CHLORIDE CRYS ER 20 MEQ PO TBCR: 20.0000 meq | EXTENDED_RELEASE_TABLET | Freq: Every day | ORAL | Status: DC

## 2011-08-30 MED ORDER — ROSUVASTATIN CALCIUM 10 MG PO TABS: 10.0000 mg | ORAL_TABLET | Freq: Every day | ORAL | Status: DC

## 2011-08-30 MED ORDER — VALSARTAN-HYDROCHLOROTHIAZIDE 160-25 MG PO TABS: 1.0000 | ORAL_TABLET | Freq: Every day | ORAL | Status: DC

## 2011-08-30 NOTE — Patient Instructions (Signed)
Have sent your labs to Dr. Steffanie Dunn Crestor 10 mg daily  Start Kdur 20 meq daily  Your physician recommends that you schedule a follow-up appointment in: 4 months with fasting labs (lp/bmet/hfp)

## 2011-08-30 NOTE — Assessment & Plan Note (Signed)
At this point we will restart Crestor 10 mg 1 daily.  She had a good response last time to this low dosage.

## 2011-08-30 NOTE — Assessment & Plan Note (Signed)
Blood pressure has been well controlled on present medication.  She does have persistent mild hypokalemia from the thiazide component and we will add K Dur 20 mEq one daily to her regimen.

## 2011-08-30 NOTE — Progress Notes (Signed)
[name] Date of Birth:  1950/04/02 [facility] [address] [place], [place]  [address] [phone]  Fax   [phone]  HPI: This pleasant 61 year old woman is seen for a 4 month followup office visit.  He has a past history of hypercholesterolemia, and exogenous obesity, and essential hypertension.  She also has mild diabetes mellitus.  Since last visit she has been doing well.  She has not been experiencing any chest pain.  She's had no palpitations.  She has occasional brief waves of dizziness which last only one or 2 seconds and do not sound significant.  At her last visit she was complaining of some myalgias and we were concerned that it might be related to her Crestor.  We stopped her Crestor 4 months ago.  We rechecked her labs now and her LDL is unacceptably high.  In retrospect she does not think that her myalgias were related to the Crestor since they have not really changed over 4 months.  Current Outpatient Prescriptions  Medication Sig Dispense Refill  . B Complex Vitamins (B COMPLEX PO) Take by mouth.        Marland Kitchen CALCIUM PO Take 600 mg by mouth daily.       . Cholecalciferol (VITAMIN D PO) Take by mouth 2 (two) times daily.        . Ibuprofen (ADVIL PO) Take by mouth.        . Saxagliptin-Metformin (KOMBIGLYZE XR) 06-998 MG TB24 Take by mouth daily.        . traMADol (ULTRAM) 50 MG tablet Take 50 mg by mouth every 6 (six) hours as needed.        . valsartan-hydrochlorothiazide (DIOVAN HCT) 160-25 MG per tablet Take 1 tablet by mouth daily.  90 tablet  3  . zolpidem (AMBIEN) 10 MG tablet Take one half tablet at bedtime as needed for insomnia  30 each  5  . DISCONTD: DIOVAN HCT 160-25 MG per tablet TAKE 1 TABLET ONCE DAILY.  30 each  11  . BEPREVE 1.5 % SOLN as directed.      . clindamycin (CLEOCIN T) 1 % external solution as directed.      . nystatin-triamcinolone (MYCOLOG II) cream as directed.      . potassium chloride SA (K-DUR,KLOR-CON) 20 MEQ  tablet Take 1 tablet (20 mEq total) by mouth daily.  90 tablet  3  . rosuvastatin (CRESTOR) 10 MG tablet Take 1 tablet (10 mg total) by mouth daily.  90 tablet  3  . DISCONTD: Saxagliptin HCl (ONGLYZA PO) Take 1,000 mg by mouth.          Allergies  Allergen Reactions  . Sulfa Antibiotics     Patient Active Problem List  Diagnosis  . Diabetes mellitus  . Dyslipidemia  . Benign hypertensive heart disease without heart failure    History  Smoking status  . Never Smoker   Smokeless tobacco  . Not on file    History  Alcohol Use  . Yes    Wine Occ.    Family History  Problem Relation Age of Onset  . Diabetes Father     Review of Systems: The patient denies any heat or cold intolerance.  No weight gain or weight loss.  The patient denies headaches or blurry vision.  There is no cough or sputum production.  The patient denies dizziness.  There is no hematuria or hematochezia.  The patient denies any muscle aches or arthritis.  The patient  denies any rash.  The patient denies frequent falling or instability.  There is no history of depression or anxiety.  All other systems were reviewed and are negative.   Physical Exam: Filed Vitals:   08/30/11 1537  BP: 118/78  Pulse: 76   the general appearance reveals a well-developed well-nourished woman in no distress.The head and neck exam reveals pupils equal and reactive.  Extraocular movements are full.  There is no scleral icterus.  The mouth and pharynx are normal.  The neck is supple.  The carotids reveal no bruits.  The jugular venous pressure is normal.  The  thyroid is not enlarged.  There is no lymphadenopathy.  The chest is clear to percussion and auscultation.  There are no rales or rhonchi.  Expansion of the chest is symmetrical.  The precordium is quiet.  The first heart sound is normal.  The second heart sound is physiologically split.  There is no murmur gallop rub or click.  There is no abnormal lift or heave.  The abdomen  is soft and nontender.  The bowel sounds are normal.  The liver and spleen are not enlarged.  There are no abdominal masses.  There are no abdominal bruits.  Extremities reveal good pedal pulses.  There is no phlebitis or edema.  There is no cyanosis or clubbing.  Strength is normal and symmetrical in all extremities.  There is no lateralizing weakness.  There are no sensory deficits.  The skin is warm and dry.  There is no rash.     Assessment / Plan: We're restarting her Crestor.  She will continue Diovan HCT and we are adding potassium supplementation.  She needs to continue to lose weight.  Her weight is down 2 pounds since last visit.  He and her husband are taking dance lessons and are getting exercise through dance lessons she will recheck in 4 months for followup office visit lipid panel hepatic function panel and basal metabolic panel

## 2011-09-25 ENCOUNTER — Telehealth: Payer: Self-pay | Admitting: Cardiology

## 2011-09-25 DIAGNOSIS — E7800 Pure hypercholesterolemia, unspecified: Secondary | ICD-10-CM

## 2011-09-25 NOTE — Telephone Encounter (Signed)
Pt calling re being taken off crestor by dr Patty Sermons, didn't do well off of it and he put her back on, she had some left so was taking those, is out now but insurance needs auth before giving her ok on refill, pls call when done [phone] uses [place]

## 2011-09-26 NOTE — Telephone Encounter (Signed)
Left message working on PA samples at front  Waiting on call back to see if she has tried and failed any other medications for cholesterol

## 2011-09-27 NOTE — Telephone Encounter (Signed)
Insurance requires PA for Crestor and she does not think she has tried and failed anything else (insurance requires).  Will forward to  Dr. Patty Sermons for review

## 2011-10-01 NOTE — Telephone Encounter (Signed)
After she uses up samples of Crestor switch to generic lipitor 40 mg daily.

## 2011-10-04 MED ORDER — ATORVASTATIN CALCIUM 40 MG PO TABS: 40.0000 mg | ORAL_TABLET | Freq: Every day | ORAL | Status: DC

## 2011-10-04 NOTE — Telephone Encounter (Signed)
Left message to call back  

## 2011-10-04 NOTE — Telephone Encounter (Signed)
Patient returning nurse call she can be reached at [phone]

## 2011-10-04 NOTE — Telephone Encounter (Signed)
Advised patient of medication change.

## 2011-12-04 ENCOUNTER — Other Ambulatory Visit (INDEPENDENT_AMBULATORY_CARE_PROVIDER_SITE_OTHER): Payer: Commercial Managed Care - PPO

## 2011-12-04 DIAGNOSIS — E785 Hyperlipidemia, unspecified: Secondary | ICD-10-CM

## 2011-12-04 LAB — LIPID PANEL
Cholesterol: 150 mg/dL (ref 0–200)
HDL: 45.5 mg/dL (ref >39.00)
LDL Cholesterol: 78 mg/dL (ref 0–99)
Total CHOL/HDL Ratio: 3
Triglycerides: 132 mg/dL (ref 0.0–149.0)
VLDL: 26.4 mg/dL (ref 0.0–40.0)

## 2011-12-04 LAB — BASIC METABOLIC PANEL
BUN: 18 mg/dL (ref 6–23)
CO2: 30 mEq/L (ref 19–32)
Calcium: 9.5 mg/dL (ref 8.4–10.5)
Chloride: 102 mEq/L (ref 96–112)
Creatinine, Ser: 0.7 mg/dL (ref 0.4–1.2)
GFR: 90.12 mL/min (ref >60.00)
Glucose, Bld: 106 mg/dL — ABNORMAL HIGH (ref 70–99)
Potassium: 3.6 mEq/L (ref 3.5–5.1)
Sodium: 140 mEq/L (ref 135–145)

## 2011-12-04 LAB — HEPATIC FUNCTION PANEL
ALT: 21 U/L (ref 0–35)
AST: 16 U/L (ref 0–37)
Albumin: 3.7 g/dL (ref 3.5–5.2)
Alkaline Phosphatase: 70 U/L (ref 39–117)
Bilirubin, Direct: 0 mg/dL (ref 0.0–0.3)
Total Bilirubin: 0.4 mg/dL (ref 0.3–1.2)
Total Protein: 6.5 g/dL (ref 6.0–8.3)

## 2011-12-04 NOTE — Progress Notes (Signed)
Quick Note:  Please make copy of labs for patient visit. ______ 

## 2011-12-07 ENCOUNTER — Ambulatory Visit (INDEPENDENT_AMBULATORY_CARE_PROVIDER_SITE_OTHER): Payer: Commercial Managed Care - PPO | Admitting: Cardiology

## 2011-12-07 ENCOUNTER — Encounter: Payer: Self-pay | Admitting: Cardiology

## 2011-12-07 VITALS — BP 128/69 | HR 87 | Resp 18 | Ht 64.0 in | Wt 197.1 lb

## 2011-12-07 DIAGNOSIS — I119 Hypertensive heart disease without heart failure: Secondary | ICD-10-CM

## 2011-12-07 MED ORDER — VALSARTAN-HYDROCHLOROTHIAZIDE 160-12.5 MG PO TABS: 1.0000 | ORAL_TABLET | Freq: Every day | ORAL | Status: DC

## 2011-12-07 NOTE — Patient Instructions (Addendum)
Change your Diovan HCT to 160/12.5 mg daily, Rx sent to [facility]  Your physician recommends that you schedule a follow-up appointment in: 4 months with fasting labs (lp/bmet/hfp)

## 2011-12-07 NOTE — Progress Notes (Signed)
[name] Date of Birth:  1950-07-13 [facility] [address] [place], [place]  [address] [phone]  Fax   [phone]  HPI: This pleasant 61 year old woman is seen for a 4 month followup office visit. He has a past history of hypercholesterolemia, and exogenous obesity, and essential hypertension. She also has mild diabetes mellitus. Since last visit she has been doing well. She has not been experiencing any chest pain. She's had no palpitations. She has occasional brief waves of dizziness which last only one or 2 seconds and do not sound significant. At previous  visit she was complaining of some myalgias and we were concerned that it might be related to her Crestor. We stopped her Crestor 4 months .Marland Kitchen We rechecked her labs off Crestor and they were unacceptably high LDL.  She is now on Lipitor 40 mg daily and tolerating it without side effects.  Current Outpatient Prescriptions  Medication Sig Dispense Refill  . atorvastatin (LIPITOR) 40 MG tablet Take 1 tablet (40 mg total) by mouth daily.  30 tablet  5  . B Complex Vitamins (B COMPLEX PO) Take by mouth.        Marland Kitchen BEPREVE 1.5 % SOLN as directed.      Marland Kitchen CALCIUM PO Take 600 mg by mouth daily.       . Cholecalciferol (VITAMIN D PO) Take by mouth 2 (two) times daily.        . clindamycin (CLEOCIN T) 1 % external solution as directed.      . Ibuprofen (ADVIL PO) Take by mouth.        . nystatin-triamcinolone (MYCOLOG II) cream as directed.      . potassium chloride SA (K-DUR,KLOR-CON) 20 MEQ tablet Take 1 tablet (20 mEq total) by mouth daily.  90 tablet  3  . Saxagliptin-Metformin (KOMBIGLYZE XR) 06-998 MG TB24 Take by mouth daily.        . traMADol (ULTRAM) 50 MG tablet Take 50 mg by mouth every 6 (six) hours as needed.        . valsartan-hydrochlorothiazide (DIOVAN HCT) 160-25 MG per tablet Take 1 tablet by mouth daily.  90 tablet  3  . zolpidem (AMBIEN) 10 MG tablet Take one half tablet at bedtime as needed  for insomnia  30 each  5  . DISCONTD: Saxagliptin HCl (ONGLYZA PO) Take 1,000 mg by mouth.          Allergies  Allergen Reactions  . Sulfa Antibiotics     Patient Active Problem List  Diagnosis  . Diabetes mellitus  . Dyslipidemia  . Benign hypertensive heart disease without heart failure    History  Smoking status  . Never Smoker   Smokeless tobacco  . Not on file    History  Alcohol Use  . Yes    Wine Occ.    Family History  Problem Relation Age of Onset  . Diabetes Father     Review of Systems: The patient denies any heat or cold intolerance.  No weight gain or weight loss.  The patient denies headaches or blurry vision.  There is no cough or sputum production.  The patient denies dizziness.  There is no hematuria or hematochezia.  The patient denies any muscle aches or arthritis.  The patient denies any rash.  The patient denies frequent falling or instability.  There is no history of depression or anxiety.  All other systems were reviewed and are negative.   Physical Exam: The general appearance  reveals a well-developed well-nourished woman in no distress.The head and neck exam reveals pupils equal and reactive.  Extraocular movements are full.  There is no scleral icterus.  The mouth and pharynx are normal.  The neck is supple.  The carotids reveal no bruits.  The jugular venous pressure is normal.  The  thyroid is not enlarged.  There is no lymphadenopathy.  The chest is clear to percussion and auscultation.  There are no rales or rhonchi.  Expansion of the chest is symmetrical.  The precordium is quiet.  The first heart sound is normal.  The second heart sound is physiologically split.  There is no murmur gallop rub or click.  There is no abnormal lift or heave.  The abdomen is soft and nontender.  The bowel sounds are normal.  The liver and spleen are not enlarged.  There are no abdominal masses.  There are no abdominal bruits.  Extremities reveal good pedal pulses.   There is no phlebitis or edema.  There is no cyanosis or clubbing.  Strength is normal and symmetrical in all extremities.  There is no lateralizing weakness.  There are no sensory deficits.  The skin is warm and dry.  There is no rash.      Assessment / Plan:  Continue on same medication except decrease valsartan HCT to 160/12.5 one daily.  This will help her potassium to be higher.  For the time being she will need to continue with potassium 20 mEq daily.  I offered her the choice to take 2 of the smaller tablets but she prefers to continue with one larger tablet. Recheck in 4 months for followup office visit lipid panel hepatic function panel and basal metabolic panel. Continue to exercise with walking and with ballroom dancing and continue careful diet.  Her weight is the same today as last visit.

## 2012-03-08 HISTORY — PX: KNEE ARTHROSCOPY WITH MENISCAL REPAIR: SHX5653

## 2012-04-07 ENCOUNTER — Ambulatory Visit: Payer: Commercial Managed Care - PPO | Admitting: Cardiology

## 2012-04-07 ENCOUNTER — Other Ambulatory Visit: Payer: Commercial Managed Care - PPO

## 2012-04-10 ENCOUNTER — Other Ambulatory Visit (INDEPENDENT_AMBULATORY_CARE_PROVIDER_SITE_OTHER): Payer: Commercial Managed Care - PPO

## 2012-04-10 DIAGNOSIS — I119 Hypertensive heart disease without heart failure: Secondary | ICD-10-CM

## 2012-04-10 LAB — LIPID PANEL
Cholesterol: 124 mg/dL (ref 0–200)
HDL: 49.7 mg/dL (ref >39.00)
LDL Cholesterol: 57 mg/dL (ref 0–99)
Total CHOL/HDL Ratio: 2
Triglycerides: 89 mg/dL (ref 0.0–149.0)
VLDL: 17.8 mg/dL (ref 0.0–40.0)

## 2012-04-10 LAB — HEPATIC FUNCTION PANEL
ALT: 28 U/L (ref 0–35)
AST: 20 U/L (ref 0–37)
Albumin: 3.6 g/dL (ref 3.5–5.2)
Alkaline Phosphatase: 66 U/L (ref 39–117)
Bilirubin, Direct: 0.1 mg/dL (ref 0.0–0.3)
Total Bilirubin: 0.6 mg/dL (ref 0.3–1.2)
Total Protein: 6.2 g/dL (ref 6.0–8.3)

## 2012-04-10 LAB — BASIC METABOLIC PANEL
BUN: 11 mg/dL (ref 6–23)
CO2: 28 mEq/L (ref 19–32)
Calcium: 9.3 mg/dL (ref 8.4–10.5)
Chloride: 102 mEq/L (ref 96–112)
Creatinine, Ser: 0.7 mg/dL (ref 0.4–1.2)
GFR: 88.56 mL/min (ref >60.00)
Glucose, Bld: 97 mg/dL (ref 70–99)
Potassium: 3.4 mEq/L — ABNORMAL LOW (ref 3.5–5.1)
Sodium: 139 mEq/L (ref 135–145)

## 2012-04-10 NOTE — Progress Notes (Signed)
Quick Note:  Please make copy of labs for patient visit. ______ 

## 2012-04-11 ENCOUNTER — Ambulatory Visit: Payer: Commercial Managed Care - PPO | Admitting: Cardiology

## 2012-04-29 ENCOUNTER — Ambulatory Visit (INDEPENDENT_AMBULATORY_CARE_PROVIDER_SITE_OTHER): Payer: Commercial Managed Care - PPO | Admitting: Cardiology

## 2012-04-29 ENCOUNTER — Encounter: Payer: Self-pay | Admitting: Cardiology

## 2012-04-29 VITALS — BP 132/72 | HR 73 | Ht 64.0 in | Wt 196.0 lb

## 2012-04-29 DIAGNOSIS — E7800 Pure hypercholesterolemia, unspecified: Secondary | ICD-10-CM

## 2012-04-29 DIAGNOSIS — I119 Hypertensive heart disease without heart failure: Secondary | ICD-10-CM

## 2012-04-29 DIAGNOSIS — E785 Hyperlipidemia, unspecified: Secondary | ICD-10-CM

## 2012-04-29 DIAGNOSIS — E876 Hypokalemia: Secondary | ICD-10-CM

## 2012-04-29 MED ORDER — POTASSIUM CHLORIDE CRYS ER 20 MEQ PO TBCR: 20.0000 meq | EXTENDED_RELEASE_TABLET | Freq: Two times a day (BID) | ORAL | Status: DC

## 2012-04-29 NOTE — Progress Notes (Signed)
[name] Date of Birth:  01-04-1951 [facility] [address] [place], [place]  [address] [phone]  Fax   [phone]  HPI: This pleasant 62 year old woman is seen for a 4 month followup office visit. She has a past history of hypercholesterolemia, and exogenous obesity, and essential hypertension. She also has mild diabetes mellitus. Since last visit she has been doing well. She has not been experiencing any chest pain. She's had no palpitations. She has occasional brief waves of dizziness which last only one or 2 seconds and do not sound significant. At previous visit she was complaining of some myalgias and we were concerned that it might be related to her Crestor. We stopped her Crestor 4 months .Marland Kitchen We rechecked her labs off Crestor and they were unacceptably high LDL. She is now on Lipitor 40 mg daily and tolerating it without side effects.  Her blood work on Lipitor is satisfactory.  Since last visit she had successful knee surgery by Dr. Terrilee Croak for a torn meniscus tear.  Current Outpatient Prescriptions  Medication Sig Dispense Refill  . atorvastatin (LIPITOR) 40 MG tablet Take 1 tablet (40 mg total) by mouth daily.  30 tablet  5  . B Complex Vitamins (B COMPLEX PO) Take by mouth.        Marland Kitchen BEPREVE 1.5 % SOLN as needed.       Marland Kitchen CALCIUM PO Take 600 mg by mouth daily.       . Cholecalciferol (VITAMIN D PO) Take by mouth 2 (two) times daily.        . clindamycin (CLEOCIN T) 1 % external solution as directed.      . Ibuprofen (ADVIL PO) Take by mouth.        . nystatin-triamcinolone (MYCOLOG II) cream as directed.      . potassium chloride SA (K-DUR,KLOR-CON) 20 MEQ tablet Take 1 tablet (20 mEq total) by mouth 2 (two) times daily.  60 tablet  11  . Saxagliptin-Metformin (KOMBIGLYZE XR) 06-998 MG TB24 Take by mouth daily.        . traMADol (ULTRAM) 50 MG tablet Take 50 mg by mouth every 6 (six) hours as needed.        . valsartan-hydrochlorothiazide (DIOVAN  HCT) 160-12.5 MG per tablet Take 1 tablet by mouth daily.  30 tablet  11  . zolpidem (AMBIEN) 10 MG tablet Take one half tablet at bedtime as needed for insomnia  30 each  5  . [DISCONTINUED] Saxagliptin HCl (ONGLYZA PO) Take 1,000 mg by mouth.         No current facility-administered medications for this visit.    Allergies  Allergen Reactions  . Sulfa Antibiotics     Patient Active Problem List  Diagnosis  . Diabetes mellitus  . Dyslipidemia  . Benign hypertensive heart disease without heart failure    History  Smoking status  . Never Smoker   Smokeless tobacco  . Not on file    History  Alcohol Use  . Yes    Comment: Wine Occ.    Family History  Problem Relation Age of Onset  . Diabetes Father     Review of Systems: The patient denies any heat or cold intolerance.  No weight gain or weight loss.  The patient denies headaches or blurry vision.  There is no cough or sputum production.  The patient denies dizziness.  There is no hematuria or hematochezia.  The patient denies any muscle aches or arthritis.  The  patient denies any rash.  The patient denies frequent falling or instability.  There is no history of depression or anxiety.  All other systems were reviewed and are negative.   Physical Exam: Filed Vitals:   04/29/12 0839  BP: 132/72  Pulse: 73   the general appearance reveals a slightly obese woman in no acute distress.The head and neck exam reveals pupils equal and reactive.  Extraocular movements are full.  There is no scleral icterus.  The mouth and pharynx are normal.  The neck is supple.  The carotids reveal no bruits.  The jugular venous pressure is normal.  The  thyroid is not enlarged.  There is no lymphadenopathy.  The chest is clear to percussion and auscultation.  There are no rales or rhonchi.  Expansion of the chest is symmetrical.  The precordium is quiet.  The first heart sound is normal.  The second heart sound is physiologically split.  There is  no murmur gallop rub or click.  There is no abnormal lift or heave.  The abdomen is soft and nontender.  The bowel sounds are normal.  The liver and spleen are not enlarged.  There are no abdominal masses.  There are no abdominal bruits.  Extremities reveal good pedal pulses.  There is no phlebitis or edema.  There is no cyanosis or clubbing.  Strength is normal and symmetrical in all extremities.  There is no lateralizing weakness.  There are no sensory deficits.  The skin is warm and dry.  There is no rash.      Assessment / Plan: Continue same medication except increase potassium up to 20 mEq twice a day. Recheck in 4 months for followup office visit lipid panel hepatic function panel and basal metabolic panel.

## 2012-04-29 NOTE — Assessment & Plan Note (Signed)
We reviewed the patient's lipids which are excellent.  Continue same dose of Lipitor.  She is not having any problems with myalgias at this point.  He is getting more exercise and she had her knee surgery and she was doing some ballroom dancing as well.

## 2012-04-29 NOTE — Assessment & Plan Note (Signed)
The blood pressure is remaining stable on current therapy.  At her last visit we reduced the dose of her HCTZ because of leg cramps from 25 mg down to 12.5 mg daily.  She is still having leg cramps in her most recent serum potassium is still low at 3.4.  She has been taking one 20 mEq potassium daily and we will increase this to twice a day to help with leg cramps and hypokalemia

## 2012-04-29 NOTE — Assessment & Plan Note (Signed)
The patient has not been having any hypoglycemic symptoms 

## 2012-04-29 NOTE — Patient Instructions (Addendum)
INCREASE YOUR KDUR (POTASSIUM) TO 20 MEQ TWICE A DAY  Your physician wants you to follow-up in: 4 months with fasting labs (lp/bmet/hfp)  You will receive a reminder letter in the mail two months in advance. If you don't receive a letter, please call our office to schedule the follow-up appointment.

## 2012-05-14 ENCOUNTER — Other Ambulatory Visit: Payer: Self-pay | Admitting: *Deleted

## 2012-05-14 ENCOUNTER — Telehealth: Payer: Self-pay | Admitting: Obstetrics & Gynecology

## 2012-05-14 ENCOUNTER — Other Ambulatory Visit: Payer: Self-pay | Admitting: Orthopedic Surgery

## 2012-05-14 DIAGNOSIS — N951 Menopausal and female climacteric states: Secondary | ICD-10-CM

## 2012-05-14 DIAGNOSIS — E7800 Pure hypercholesterolemia, unspecified: Secondary | ICD-10-CM

## 2012-05-14 MED ORDER — ATORVASTATIN CALCIUM 40 MG PO TABS: 40.0000 mg | ORAL_TABLET | Freq: Every day | ORAL | Status: DC

## 2012-05-14 NOTE — Telephone Encounter (Signed)
Yes. Okay to send order.  Dx: PMP 627.2

## 2012-05-14 NOTE — Telephone Encounter (Signed)
Pt [facility] fax: [phone]

## 2012-05-14 NOTE — Telephone Encounter (Signed)
Dr. Hyacinth Meeker,   Pt's last BMD 2007. OK to send order?  AA

## 2012-06-04 ENCOUNTER — Telehealth: Payer: Self-pay | Admitting: Cardiology

## 2012-06-04 DIAGNOSIS — E876 Hypokalemia: Secondary | ICD-10-CM

## 2012-06-04 MED ORDER — POTASSIUM CHLORIDE CRYS ER 20 MEQ PO TBCR: 20.0000 meq | EXTENDED_RELEASE_TABLET | Freq: Two times a day (BID) | ORAL | Status: DC

## 2012-06-04 MED ORDER — LOSARTAN POTASSIUM 50 MG PO TABS: 50.0000 mg | ORAL_TABLET | Freq: Every day | ORAL | Status: DC

## 2012-06-04 NOTE — Telephone Encounter (Signed)
Patient having cramps in her legs, feet,  and now her hands. Cramps so bad in her legs that it keeps her up at night. This all started after she started taking her Diovan/HCT. At last labs her K+ was increased to twice a day and no help with cramping. She is out of town on a [employer] trip and needs to be able to function and is having a hard time secondary to lack of sleep. Will forward to  Dr. Patty Sermons for review

## 2012-06-04 NOTE — Telephone Encounter (Signed)
New Prob     Pt is experiencing cramping her leg that is keeping her up at night. Very concerned and would like to speak to nurse.

## 2012-06-04 NOTE — Telephone Encounter (Signed)
Stop diovan HCT and just switch to losartan 50 mg daily.  Increase KDur to 20 meq TID until cramps subside then go back to BID.  Check BMET in several weeks.

## 2012-06-04 NOTE — Telephone Encounter (Signed)
Advised patient. She will call next week to let me know how she is doing and will schedule follow up BMET

## 2012-06-11 ENCOUNTER — Other Ambulatory Visit: Payer: Self-pay | Admitting: Obstetrics & Gynecology

## 2012-06-11 DIAGNOSIS — Z1231 Encounter for screening mammogram for malignant neoplasm of breast: Secondary | ICD-10-CM

## 2012-06-16 ENCOUNTER — Telehealth: Payer: Self-pay | Admitting: Cardiology

## 2012-06-16 DIAGNOSIS — E876 Hypokalemia: Secondary | ICD-10-CM

## 2012-06-16 NOTE — Telephone Encounter (Signed)
New problem    Was told  To call the office back when she return out of town.    Offer an appt on  7/2 .patient stated that too far off.

## 2012-06-16 NOTE — Telephone Encounter (Signed)
Left message to call back  

## 2012-06-16 NOTE — Telephone Encounter (Signed)
Patient having tightness in her ankles and hands, swelling since stopping HCT (was in her Diovan). Discussed with  Dr. Patty Sermons and patient needs bmet tomorrow and then will decide what to do with diuretics

## 2012-06-16 NOTE — Telephone Encounter (Signed)
Follow up ° ° °Pt returning your call °

## 2012-06-17 ENCOUNTER — Other Ambulatory Visit (INDEPENDENT_AMBULATORY_CARE_PROVIDER_SITE_OTHER): Payer: Commercial Managed Care - PPO

## 2012-06-17 DIAGNOSIS — E876 Hypokalemia: Secondary | ICD-10-CM

## 2012-06-18 LAB — BASIC METABOLIC PANEL
BUN: 15 mg/dL (ref 6–23)
CO2: 26 mEq/L (ref 19–32)
Calcium: 9.6 mg/dL (ref 8.4–10.5)
Chloride: 106 mEq/L (ref 96–112)
Creatinine, Ser: 0.8 mg/dL (ref 0.4–1.2)
GFR: 81.82 mL/min (ref >60.00)
Glucose, Bld: 137 mg/dL — ABNORMAL HIGH (ref 70–99)
Potassium: 3.9 mEq/L (ref 3.5–5.1)
Sodium: 138 mEq/L (ref 135–145)

## 2012-06-19 NOTE — Telephone Encounter (Signed)
Advised patient, she will call back with update  Notes Recorded by Cassell Clement, MD on 06/18/2012 at 12:19 PM Her electrolytes are fine and should not be causing leg cramps. Potassium is adequate at 3.9. Her leg cramps may be coming from her Lipitor. I would like her to stop Lipitor for 2 weeks and then restart it at a much lower dose of just 10 mg daily and see if this helps the cramps

## 2012-06-25 ENCOUNTER — Other Ambulatory Visit: Payer: Commercial Managed Care - PPO

## 2012-07-01 ENCOUNTER — Telehealth: Payer: Self-pay | Admitting: Cardiology

## 2012-07-01 MED ORDER — ROSUVASTATIN CALCIUM 20 MG PO TABS: 20.0000 mg | ORAL_TABLET | Freq: Every day | ORAL | Status: DC

## 2012-07-01 MED ORDER — FUROSEMIDE 20 MG PO TABS: ORAL_TABLET | ORAL | Status: DC

## 2012-07-01 MED ORDER — ROSUVASTATIN CALCIUM 5 MG PO TABS: 5.0000 mg | ORAL_TABLET | Freq: Every day | ORAL | Status: DC

## 2012-07-01 NOTE — Telephone Encounter (Signed)
1) Patient states cramps in her legs are much better since stopping Lipitor, knows she should be taking something.   2) Is without diuretic now and has some swelling from time to time. States she is concerned she may need it.   Will forward to  Dr. Patty Sermons for review

## 2012-07-01 NOTE — Telephone Encounter (Signed)
New problem   Pt was told to check in 2wk to speak to nurse. Pt is leaving tomorrow morning 7am for 2wks. Please call pt

## 2012-07-01 NOTE — Telephone Encounter (Signed)
For her cholesterol we will use Crestor 5 mg 1 daily. for intermittent peripheral edema she may use furosemide 20 mg every other day when necessary

## 2012-07-01 NOTE — Telephone Encounter (Signed)
Advised patient  Sent Crestor 20 mg to pharmacy initially, resent correct dose of Crestor 5 mg daily. Called and spoke with Herbert Seta at [facility] that 5 mg correct, d/c 20 mg Rx.

## 2012-07-21 ENCOUNTER — Ambulatory Visit
Admission: RE | Admit: 2012-07-21 | Discharge: 2012-07-21 | Disposition: A | Discharge status: Home / Self Care | Payer: Commercial Managed Care - PPO | Source: Ambulatory Visit | Attending: Obstetrics & Gynecology | Admitting: Obstetrics & Gynecology

## 2012-07-21 DIAGNOSIS — Z1231 Encounter for screening mammogram for malignant neoplasm of breast: Secondary | ICD-10-CM

## 2012-07-21 DIAGNOSIS — N951 Menopausal and female climacteric states: Secondary | ICD-10-CM

## 2012-08-25 ENCOUNTER — Other Ambulatory Visit: Payer: Commercial Managed Care - PPO

## 2012-08-28 ENCOUNTER — Other Ambulatory Visit (INDEPENDENT_AMBULATORY_CARE_PROVIDER_SITE_OTHER): Payer: Commercial Managed Care - PPO

## 2012-08-28 ENCOUNTER — Ambulatory Visit (INDEPENDENT_AMBULATORY_CARE_PROVIDER_SITE_OTHER): Payer: Commercial Managed Care - PPO | Admitting: Cardiology

## 2012-08-28 ENCOUNTER — Encounter: Payer: Self-pay | Admitting: Cardiology

## 2012-08-28 VITALS — BP 122/74 | HR 76 | Ht 63.0 in | Wt 201.8 lb

## 2012-08-28 DIAGNOSIS — IMO0001 Reserved for inherently not codable concepts without codable children: Secondary | ICD-10-CM

## 2012-08-28 DIAGNOSIS — E7800 Pure hypercholesterolemia, unspecified: Secondary | ICD-10-CM

## 2012-08-28 DIAGNOSIS — E785 Hyperlipidemia, unspecified: Secondary | ICD-10-CM

## 2012-08-28 DIAGNOSIS — I119 Hypertensive heart disease without heart failure: Secondary | ICD-10-CM

## 2012-08-28 DIAGNOSIS — G4700 Insomnia, unspecified: Secondary | ICD-10-CM

## 2012-08-28 LAB — BASIC METABOLIC PANEL
BUN: 12 mg/dL (ref 6–23)
CO2: 27 mEq/L (ref 19–32)
Calcium: 9.8 mg/dL (ref 8.4–10.5)
Chloride: 105 mEq/L (ref 96–112)
Creatinine, Ser: 0.7 mg/dL (ref 0.4–1.2)
GFR: 84.32 mL/min (ref >60.00)
Glucose, Bld: 102 mg/dL — ABNORMAL HIGH (ref 70–99)
Potassium: 3.7 mEq/L (ref 3.5–5.1)
Sodium: 139 mEq/L (ref 135–145)

## 2012-08-28 LAB — HEPATIC FUNCTION PANEL
ALT: 20 U/L (ref 0–35)
AST: 16 U/L (ref 0–37)
Albumin: 4 g/dL (ref 3.5–5.2)
Alkaline Phosphatase: 65 U/L (ref 39–117)
Bilirubin, Direct: 0 mg/dL (ref 0.0–0.3)
Total Bilirubin: 0.6 mg/dL (ref 0.3–1.2)
Total Protein: 6.4 g/dL (ref 6.0–8.3)

## 2012-08-28 LAB — LIPID PANEL
Cholesterol: 161 mg/dL (ref 0–200)
HDL: 55.9 mg/dL (ref >39.00)
LDL Cholesterol: 79 mg/dL (ref 0–99)
Total CHOL/HDL Ratio: 3
Triglycerides: 133 mg/dL (ref 0.0–149.0)
VLDL: 26.6 mg/dL (ref 0.0–40.0)

## 2012-08-28 MED ORDER — ZOLPIDEM TARTRATE 10 MG PO TABS: ORAL_TABLET | ORAL | Status: DC

## 2012-08-28 NOTE — Patient Instructions (Addendum)
Your physician recommends that you continue on your current medications as directed. Please refer to the Current Medication list given to you today.  Your physician wants you to follow-up in: 4 months with fasting labs (lp/bmet/hfp) You will receive a reminder letter in the mail two months in advance. If you don't receive a letter, please call our office to schedule the follow-up appointment.  

## 2012-08-28 NOTE — Assessment & Plan Note (Signed)
Her diabetes is followed closely by Dr. [name].  Most recent A1c was 5.6.

## 2012-08-28 NOTE — Assessment & Plan Note (Signed)
Patient has a history of dyslipidemia.  She is trying to watch her diet carefully.  He is diabetic and she states that her recent A1c was excellent at 5.6.  She is avoiding carbohydrates.  He has been exercising at [employer] with a trainer.  She does water aerobics as well as working out with a Psychologist, educational on other machines.  Regarding her weight we discussed whether she would like to go back to see a nutritionist again but she did not think that that was worthwhile last time.  The patient sleeps poorly.  She does not go to bed until after midnight and she frequently has trouble falling asleep.  She does not think that she has a problem with sleep apnea or snoring.

## 2012-08-28 NOTE — Assessment & Plan Note (Signed)
Blood pressures remaining stable on current medication 

## 2012-08-28 NOTE — Progress Notes (Signed)
Christina Wong Date of Birth:  02-03-1951 [facility] [address] [place], [place]  [address] [phone]  Fax   [phone]  HPI: HPI:  This pleasant 62 year old woman is seen for a 4 month followup office visit. She has a past history of hypercholesterolemia, and exogenous obesity, and essential hypertension. She also has mild diabetes mellitus. Since last visit she has been doing well. She has not been experiencing any chest pain. She's had no palpitations.  She is discouraged about her weight.  Her weight is up 5 pounds since last visit.  Current Outpatient Prescriptions  Medication Sig Dispense Refill  . B Complex Vitamins (B COMPLEX PO) Take by mouth.        Marland Kitchen BEPREVE 1.5 % SOLN as needed.       Marland Kitchen CALCIUM PO Take 600 mg by mouth daily.       . Cholecalciferol (VITAMIN D PO) Take by mouth 2 (two) times daily.        . clindamycin (CLEOCIN T) 1 % external solution as directed.      . furosemide (LASIX) 20 MG tablet Every other day as needed  30 tablet  3  . Ibuprofen (ADVIL PO) Take by mouth.        . losartan (COZAAR) 50 MG tablet Take 1 tablet (50 mg total) by mouth daily.  30 tablet  5  . nystatin-triamcinolone (MYCOLOG II) cream as directed.      . potassium chloride SA (K-DUR,KLOR-CON) 20 MEQ tablet Take 1 tablet (20 mEq total) by mouth 2 (two) times daily.  60 tablet  11  . rosuvastatin (CRESTOR) 5 MG tablet Take 1 tablet (5 mg total) by mouth daily.  30 tablet  5  . Saxagliptin-Metformin (KOMBIGLYZE XR) 06-998 MG TB24 Take by mouth daily.        . traMADol (ULTRAM) 50 MG tablet Take 50 mg by mouth every 6 (six) hours as needed.        . zolpidem (AMBIEN) 10 MG tablet Take one half tablet at bedtime as needed for insomnia  30 each  5  . [DISCONTINUED] Saxagliptin HCl (ONGLYZA PO) Take 1,000 mg by mouth.         No current facility-administered medications for this visit.    Allergies  Allergen Reactions  . Lipitor (Atorvastatin)     Leg cramps    . Sulfa Antibiotics     Patient Active Problem List   Diagnosis Date Noted  . Diabetes mellitus 08/23/2010  . Dyslipidemia 08/23/2010  . Benign hypertensive heart disease without heart failure 08/23/2010    History  Smoking status  . Never Smoker   Smokeless tobacco  . Not on file    History  Alcohol Use  . Yes    Comment: Wine Occ.    Family History  Problem Relation Age of Onset  . Diabetes Father     Review of Systems: The patient denies any heat or cold intolerance.  No weight gain or weight loss.  The patient denies headaches or blurry vision.  There is no cough or sputum production.  The patient denies dizziness.  There is no hematuria or hematochezia.  The patient denies any muscle aches or arthritis.  The patient denies any rash.  The patient denies frequent falling or instability.  There is no history of depression or anxiety.  All other systems were reviewed and are negative.   Physical Exam: Filed Vitals:   08/28/12 0913  BP: 122/74  Pulse: 76   the general appearance reveals a overweight woman in no distress.  Weight up 5 pounds since last visit.The head and neck exam reveals pupils equal and reactive.  Extraocular movements are full.  There is no scleral icterus.  The mouth and pharynx are normal.  The neck is supple.  The carotids reveal no bruits.  The jugular venous pressure is normal.  The  thyroid is not enlarged.  There is no lymphadenopathy.  The chest is clear to percussion and auscultation.  There are no rales or rhonchi.  Expansion of the chest is symmetrical.  The precordium is quiet.  The first heart sound is normal.  The second heart sound is physiologically split.  There is no murmur gallop rub or click.  There is no abnormal lift or heave.  The abdomen is soft and nontender.  The bowel sounds are normal.  The liver and spleen are not enlarged.  There are no abdominal masses.  There are no abdominal bruits.  Extremities reveal good pedal pulses.   There is no phlebitis or edema.  There is no cyanosis or clubbing.  Strength is normal and symmetrical in all extremities.  There is no lateralizing weakness.  There are no sensory deficits.  The skin is warm and dry.  There is no rash.      Assessment / Plan: Continue same medication.  She will speak with her PCP about her sleep problems.  She might benefit from referral to a sleep center.  Continue to work harder on calorie restriction and weight loss.  Stay on same medication.  Blood work today pending.  Recheck in 4 months for office visit and fasting lab work.

## 2012-08-28 NOTE — Progress Notes (Signed)
Quick Note:  Please report to patient. The recent labs are stable. Continue same medication and careful diet. ______ 

## 2012-09-16 ENCOUNTER — Telehealth: Payer: Self-pay

## 2012-09-16 NOTE — Telephone Encounter (Signed)
Lmtcb//kn 

## 2012-09-16 NOTE — Telephone Encounter (Signed)
Returning a call to Kelly °

## 2012-09-18 NOTE — Telephone Encounter (Signed)
lmtcb re:BMD results.

## 2012-09-30 NOTE — Telephone Encounter (Signed)
Patient notified of BMD results. Will continue her calcium and vitamin D.

## 2012-10-02 ENCOUNTER — Ambulatory Visit: Payer: Self-pay | Admitting: Obstetrics & Gynecology

## 2012-10-16 ENCOUNTER — Ambulatory Visit (INDEPENDENT_AMBULATORY_CARE_PROVIDER_SITE_OTHER): Payer: Commercial Managed Care - PPO | Admitting: Obstetrics & Gynecology

## 2012-10-16 ENCOUNTER — Encounter: Payer: Self-pay | Admitting: Obstetrics & Gynecology

## 2012-10-16 VITALS — BP 124/72 | HR 68 | Resp 12 | Ht 63.5 in | Wt 205.0 lb

## 2012-10-16 DIAGNOSIS — Z01419 Encounter for gynecological examination (general) (routine) without abnormal findings: Secondary | ICD-10-CM

## 2012-10-16 DIAGNOSIS — Z0000 Encounter for general adult medical examination without abnormal findings: Secondary | ICD-10-CM

## 2012-10-16 DIAGNOSIS — R21 Rash and other nonspecific skin eruption: Secondary | ICD-10-CM

## 2012-10-16 LAB — POCT URINALYSIS DIPSTICK
Bilirubin, UA: NEGATIVE
Blood, UA: NEGATIVE
Glucose, UA: NEGATIVE
Ketones, UA: NEGATIVE
Leukocytes, UA: NEGATIVE
Nitrite, UA: NEGATIVE
Protein, UA: NEGATIVE
Urobilinogen, UA: NEGATIVE
pH, UA: 5

## 2012-10-16 NOTE — Patient Instructions (Signed)

## 2012-10-16 NOTE — Progress Notes (Signed)
62 y.o. G1P1 MarriedCaucasianF here for annual exam.  Doing well.  Son and daughter in law moved to [place].  She is expecting in the end of November.   Last Hb A1c was 5.7.  Feeling like she is "under attack" spiritually.  Joined [employer] and starting working out.  Broke her foot wearing a "rediculous" pair of shoes. Having a skin issue--rash.  Seeing Dr. Fatima Blank, dermatology, in [place].  Has been on steroids several times.  Last issue is her back pain.  She is seeing an ortho in [place].  Wants somebody local.    Patient's last menstrual period was 02/05/2006.          Sexually active: yes  The current method of family planning is none.    Exercising: yes  variety Smoker:  no  Health Maintenance: Pap:  08/23/11 WNL/negative HR HPV History of abnormal Pap:  no MMG:  07/21/12 normal Colonoscopy:  2006 BMD:   07/21/12, normal TDaP:  5/10 Screening Labs: PCP, Hb today: PCP, Urine today: negative   reports that she has never smoked. She has never used smokeless tobacco. She reports that she drinks about 2.5 ounces of alcohol per week. She reports that she does not use illicit drugs.  Past Medical History  Diagnosis Date  . Hypertension     hx of essential- on meds  . Hypercholesterolemia   . Diabetes mellitus     mild  . Exogenous obesity     She has had a problem with  . Elevated liver function tests     One of her studies are slightly elevated which may represent fatty liver from her Diabetes and her obesity or may represent an adverse effect from the Crestor.  . Infertility   . Leg cramps     in last 6 months    Past Surgical History  Procedure Laterality Date  . Cardiovascular stress test  06-29-2002    EF is normal at 55%  . Ankle surgery    . Cervical disc surgery      6&7  . Dilation and curettage of uterus    . Laparoscopy      multiple s/p infertility  . Pilonidal cyst excision    . Knee arthroscopy with meniscal repair      right knee    Current Outpatient  Prescriptions  Medication Sig Dispense Refill  . B Complex Vitamins (B COMPLEX PO) Take by mouth.        Marland Kitchen CALCIUM PO Take 600 mg by mouth daily.       . Cholecalciferol (VITAMIN D PO) Take by mouth 2 (two) times daily.        . Ibuprofen (ADVIL PO) Take by mouth.        . losartan (COZAAR) 50 MG tablet Take 1 tablet (50 mg total) by mouth daily.  30 tablet  5  . potassium chloride SA (K-DUR,KLOR-CON) 20 MEQ tablet Take 1 tablet (20 mEq total) by mouth 2 (two) times daily.  60 tablet  11  . PREDNISONE, PAK, PO Take by mouth.      Marland Kitchen PRESCRIPTION MEDICATION Doxycycline for acne-strength unknown      . rosuvastatin (CRESTOR) 5 MG tablet Take 1 tablet (5 mg total) by mouth daily.  30 tablet  5  . Saxagliptin-Metformin (KOMBIGLYZE XR) 06-998 MG TB24 Take by mouth daily.        . traMADol (ULTRAM) 50 MG tablet Take 50 mg by mouth every 6 (six) hours as needed.        Marland Kitchen  zolpidem (AMBIEN) 10 MG tablet Take one half tablet at bedtime as needed for insomnia  30 tablet  5  . BEPREVE 1.5 % SOLN as needed.       . clindamycin (CLEOCIN T) 1 % external solution as directed.      . nystatin-triamcinolone (MYCOLOG II) cream as directed.      . [DISCONTINUED] Saxagliptin HCl (ONGLYZA PO) Take 1,000 mg by mouth.         No current facility-administered medications for this visit.    Family History  Problem Relation Age of Onset  . Diabetes Father   . Hypertension Father   . Hypertension Paternal Grandmother   . Heart Problems Mother     aortic valve replacement  . CVA Father   . CVA Paternal Grandmother     ROS:  Pertinent items are noted in HPI.  Otherwise, a comprehensive ROS was negative.  Exam:   BP 124/72  Pulse 68  Resp 12  Ht 5' 3.5" (1.613 m)  Wt 205 lb (92.987 kg)  BMI 35.74 kg/m2  LMP 02/05/2006  Weight change: @WEIGHTCHANGE @ Height:   Height: 5' 3.5" (161.3 cm)  Ht Readings from Last 3 Encounters:  10/16/12 5' 3.5" (1.613 m)  08/28/12 5\' 3"  (1.6 m)  04/29/12 5\' 4"  (1.626 m)     General appearance: alert, cooperative and appears stated age Head: Normocephalic, without obvious abnormality, atraumatic Neck: no adenopathy, supple, symmetrical, trachea midline and thyroid normal to inspection and palpation Lungs: clear to auscultation bilaterally Breasts: normal appearance, no masses or tenderness Heart: regular rate and rhythm Abdomen: soft, non-tender; bowel sounds normal; no masses,  no organomegaly Extremities: extremities normal, atraumatic, no cyanosis or edema Skin: Skin color, texture, turgor normal. No rashes or lesions Lymph nodes: Cervical, supraclavicular, and axillary nodes normal. No abnormal inguinal nodes palpated Neurologic: Grossly normal   Pelvic: External genitalia:  no lesions              Urethra:  normal appearing urethra with no masses, tenderness or lesions              Bartholins and Skenes: normal                 Vagina: normal appearing vagina with normal color and discharge, no lesions              Cervix: no lesions              Pap taken: no Bimanual Exam:  Uterus:  normal size, contour, position, consistency, mobility, non-tender              Adnexa: normal adnexa and no mass, fullness, tenderness               Rectovaginal: Confirms               Anus:  normal sphincter tone, no lesions  A:  Well Woman with normal exam PMP, no HRT DM, hypertension, elevated lipids  P:   Mammogram yearly. pap smear with neg HR HPV 7/13.  No pap today. Labs all with Dr. Nicholos Johns and Dr. Patty Sermons. return annually or prn  An After Visit Summary was printed and given to the patient.

## 2012-12-15 ENCOUNTER — Other Ambulatory Visit: Payer: Self-pay | Admitting: Cardiology

## 2013-01-12 ENCOUNTER — Other Ambulatory Visit: Payer: Self-pay | Admitting: Cardiology

## 2013-02-25 ENCOUNTER — Other Ambulatory Visit: Payer: Self-pay | Admitting: Orthopedic Surgery

## 2013-02-25 DIAGNOSIS — M25562 Pain in left knee: Secondary | ICD-10-CM

## 2013-03-09 ENCOUNTER — Ambulatory Visit
Admission: RE | Admit: 2013-03-09 | Discharge: 2013-03-09 | Disposition: A | Discharge status: Home / Self Care | Payer: Commercial Managed Care - PPO | Source: Ambulatory Visit | Attending: Orthopedic Surgery | Admitting: Orthopedic Surgery

## 2013-03-09 DIAGNOSIS — M25562 Pain in left knee: Secondary | ICD-10-CM

## 2013-06-25 ENCOUNTER — Other Ambulatory Visit: Payer: Self-pay | Admitting: *Deleted

## 2013-06-25 DIAGNOSIS — E876 Hypokalemia: Secondary | ICD-10-CM

## 2013-06-25 MED ORDER — POTASSIUM CHLORIDE CRYS ER 20 MEQ PO TBCR: 20.0000 meq | EXTENDED_RELEASE_TABLET | Freq: Two times a day (BID) | ORAL | Status: DC

## 2013-07-11 DIAGNOSIS — R4701 Aphasia: Secondary | ICD-10-CM

## 2013-07-11 HISTORY — DX: Aphasia: R47.01

## 2013-08-04 ENCOUNTER — Other Ambulatory Visit: Payer: Self-pay

## 2013-08-04 DIAGNOSIS — Z1231 Encounter for screening mammogram for malignant neoplasm of breast: Secondary | ICD-10-CM

## 2013-08-06 ENCOUNTER — Other Ambulatory Visit: Payer: Self-pay | Admitting: Cardiology

## 2013-08-18 ENCOUNTER — Ambulatory Visit
Admission: RE | Admit: 2013-08-18 | Discharge: 2013-08-18 | Disposition: A | Discharge status: Home / Self Care | Payer: Commercial Managed Care - PPO | Source: Ambulatory Visit

## 2013-08-18 DIAGNOSIS — Z1231 Encounter for screening mammogram for malignant neoplasm of breast: Secondary | ICD-10-CM

## 2013-09-01 ENCOUNTER — Encounter (HOSPITAL_COMMUNITY): Payer: Self-pay | Admitting: Emergency Medicine

## 2013-09-01 ENCOUNTER — Emergency Department (HOSPITAL_COMMUNITY): Payer: Commercial Managed Care - PPO

## 2013-09-01 ENCOUNTER — Observation Stay (HOSPITAL_COMMUNITY)
Admission: EM | Admit: 2013-09-01 | Discharge: 2013-09-02 | Disposition: A | Discharge status: Home / Self Care | Payer: Commercial Managed Care - PPO | Attending: Internal Medicine | Admitting: Internal Medicine

## 2013-09-01 DIAGNOSIS — G453 Amaurosis fugax: Secondary | ICD-10-CM

## 2013-09-01 DIAGNOSIS — G459 Transient cerebral ischemic attack, unspecified: Secondary | ICD-10-CM | POA: Diagnosis present

## 2013-09-01 DIAGNOSIS — IMO0001 Reserved for inherently not codable concepts without codable children: Secondary | ICD-10-CM | POA: Diagnosis present

## 2013-09-01 DIAGNOSIS — R519 Headache, unspecified: Secondary | ICD-10-CM

## 2013-09-01 DIAGNOSIS — Z79899 Other long term (current) drug therapy: Secondary | ICD-10-CM | POA: Diagnosis not present

## 2013-09-01 DIAGNOSIS — E669 Obesity, unspecified: Secondary | ICD-10-CM | POA: Diagnosis not present

## 2013-09-01 DIAGNOSIS — E785 Hyperlipidemia, unspecified: Secondary | ICD-10-CM | POA: Diagnosis present

## 2013-09-01 DIAGNOSIS — H3400 Transient retinal artery occlusion, unspecified eye: Secondary | ICD-10-CM | POA: Diagnosis not present

## 2013-09-01 DIAGNOSIS — E1165 Type 2 diabetes mellitus with hyperglycemia: Secondary | ICD-10-CM

## 2013-09-01 DIAGNOSIS — I119 Hypertensive heart disease without heart failure: Secondary | ICD-10-CM | POA: Diagnosis present

## 2013-09-01 DIAGNOSIS — E119 Type 2 diabetes mellitus without complications: Secondary | ICD-10-CM | POA: Insufficient documentation

## 2013-09-01 DIAGNOSIS — I10 Essential (primary) hypertension: Secondary | ICD-10-CM | POA: Diagnosis not present

## 2013-09-01 DIAGNOSIS — R51 Headache: Secondary | ICD-10-CM | POA: Insufficient documentation

## 2013-09-01 DIAGNOSIS — E7800 Pure hypercholesterolemia, unspecified: Secondary | ICD-10-CM | POA: Diagnosis not present

## 2013-09-01 DIAGNOSIS — N979 Female infertility, unspecified: Secondary | ICD-10-CM | POA: Insufficient documentation

## 2013-09-01 DIAGNOSIS — R4701 Aphasia: Secondary | ICD-10-CM

## 2013-09-01 HISTORY — DX: Headache: R51

## 2013-09-01 HISTORY — DX: Transient cerebral ischemic attack, unspecified: G45.9

## 2013-09-01 HISTORY — DX: Type 2 diabetes mellitus without complications: E11.9

## 2013-09-01 HISTORY — DX: Pneumonia, unspecified organism: J18.9

## 2013-09-01 LAB — COMPREHENSIVE METABOLIC PANEL
ALT: 19 U/L (ref 0–35)
AST: 18 U/L (ref 0–37)
Albumin: 4 g/dL (ref 3.5–5.2)
Alkaline Phosphatase: 78 U/L (ref 39–117)
Anion gap: 16 — ABNORMAL HIGH (ref 5–15)
BUN: 12 mg/dL (ref 6–23)
CO2: 22 mEq/L (ref 19–32)
Calcium: 9.5 mg/dL (ref 8.4–10.5)
Chloride: 103 mEq/L (ref 96–112)
Creatinine, Ser: 0.65 mg/dL (ref 0.50–1.10)
GFR calc Af Amer: >90 mL/min (ref >90)
GFR calc non Af Amer: >90 mL/min (ref >90)
Glucose, Bld: 172 mg/dL — ABNORMAL HIGH (ref 70–99)
Potassium: 3.7 mEq/L (ref 3.7–5.3)
Sodium: 141 mEq/L (ref 137–147)
Total Bilirubin: 0.3 mg/dL (ref 0.3–1.2)
Total Protein: 6.8 g/dL (ref 6.0–8.3)

## 2013-09-01 LAB — I-STAT CHEM 8, ED
BUN: 11 mg/dL (ref 6–23)
Calcium, Ion: 1.2 mmol/L (ref 1.13–1.30)
Chloride: 109 mEq/L (ref 96–112)
Creatinine, Ser: 0.8 mg/dL (ref 0.50–1.10)
Glucose, Bld: 181 mg/dL — ABNORMAL HIGH (ref 70–99)
HCT: 40 % (ref 36.0–46.0)
Hemoglobin: 13.6 g/dL (ref 12.0–15.0)
Potassium: 3.5 mEq/L — ABNORMAL LOW (ref 3.7–5.3)
Sodium: 139 mEq/L (ref 137–147)
TCO2: 21 mmol/L (ref 0–100)

## 2013-09-01 LAB — URINALYSIS, ROUTINE W REFLEX MICROSCOPIC
Bilirubin Urine: NEGATIVE
Glucose, UA: NEGATIVE mg/dL
Hgb urine dipstick: NEGATIVE
Ketones, ur: NEGATIVE mg/dL
Nitrite: NEGATIVE
Protein, ur: NEGATIVE mg/dL
Specific Gravity, Urine: 1.013 (ref 1.005–1.030)
Urobilinogen, UA: 0.2 mg/dL (ref 0.0–1.0)
pH: 6.5 (ref 5.0–8.0)

## 2013-09-01 LAB — APTT: aPTT: 29 seconds (ref 24–37)

## 2013-09-01 LAB — DIFFERENTIAL
Basophils Absolute: 0 10*3/uL (ref 0.0–0.1)
Basophils Relative: 0 % (ref 0–1)
Eosinophils Absolute: 0.3 10*3/uL (ref 0.0–0.7)
Eosinophils Relative: 5 % (ref 0–5)
Lymphocytes Relative: 25 % (ref 12–46)
Lymphs Abs: 1.7 10*3/uL (ref 0.7–4.0)
Monocytes Absolute: 0.4 10*3/uL (ref 0.1–1.0)
Monocytes Relative: 5 % (ref 3–12)
Neutro Abs: 4.3 10*3/uL (ref 1.7–7.7)
Neutrophils Relative %: 65 % (ref 43–77)

## 2013-09-01 LAB — URINE MICROSCOPIC-ADD ON

## 2013-09-01 LAB — CBC
HCT: 40 % (ref 36.0–46.0)
Hemoglobin: 13.4 g/dL (ref 12.0–15.0)
MCH: 29.3 pg (ref 26.0–34.0)
MCHC: 33.5 g/dL (ref 30.0–36.0)
MCV: 87.3 fL (ref 78.0–100.0)
Platelets: 172 10*3/uL (ref 150–400)
RBC: 4.58 MIL/uL (ref 3.87–5.11)
RDW: 13 % (ref 11.5–15.5)
WBC: 6.7 10*3/uL (ref 4.0–10.5)

## 2013-09-01 LAB — I-STAT TROPONIN, ED: Troponin i, poc: 0 ng/mL (ref 0.00–0.08)

## 2013-09-01 LAB — PROTIME-INR
INR: 1.01 (ref 0.00–1.49)
Prothrombin Time: 13.3 seconds (ref 11.6–15.2)

## 2013-09-01 LAB — CBG MONITORING, ED: Glucose-Capillary: 165 mg/dL — ABNORMAL HIGH (ref 70–99)

## 2013-09-01 MED ORDER — METOCLOPRAMIDE HCL 5 MG/ML IJ SOLN
10.0000 mg | Freq: Once | INTRAMUSCULAR | Status: AC
  Administered 2013-09-01: 10 mg via INTRAVENOUS
  Filled 2013-09-01: qty 2

## 2013-09-01 MED ORDER — SODIUM CHLORIDE 0.9 % IV BOLUS (SEPSIS)
1000.0000 mL | Freq: Once | INTRAVENOUS | Status: AC
  Administered 2013-09-01: 1000 mL via INTRAVENOUS

## 2013-09-01 MED ORDER — DIPHENHYDRAMINE HCL 50 MG/ML IJ SOLN
25.0000 mg | Freq: Once | INTRAMUSCULAR | Status: AC
  Administered 2013-09-01: 25 mg via INTRAVENOUS
  Filled 2013-09-01: qty 1

## 2013-09-01 NOTE — ED Notes (Signed)
Pt. reports sudden onset headache , brief confusion and expressive aphasia while eating dinner this evening ( 7 pm ) . Alert and oriented at arrival , ambulatory , speech clear/no facial asymmetry , no arm drift /respirations unlabored.

## 2013-09-01 NOTE — ED Provider Notes (Signed)
CSN: [id number]     Arrival date & time 09/01/13  1953 History   First MD Initiated Contact with Patient 09/01/13 2050     Chief Complaint  Patient presents with  . Headache  . Aphasia     (Consider location/radiation/quality/duration/timing/severity/associated sxs/prior Treatment) HPI Patient reports she has never had headaches however the past 6 months she's been getting frequent headaches. She has seen her optometrist who has evaluated her eyes and states she does not have glaucoma. She's been having headaches off and on the past 4 days. She reports tonight they went to dinner and she noted that her right lateral visual field was blurred and then seemed to be gone. That lasted about 5-10 minutes. She then felt hot and felt like something was wrong. She felt like she couldn't think of how she wanted to express herself. Her husband states her speech was clear and made sense. He states it was not slurred however she felt like she could not communicate. They then walked a block to their vehicle and he states she did not stumble. She relates she's had a headache all afternoon. It is in the frontal and behind her eyes. It also seems to go into her neck. She states if she presses on a spot in the back of her neck or over her forehead the headache improves. It also improves if she takes Advil. She states the headache is not throbbing. She describes it as dull. She has minimal photophobia and denies sound sensitivity. She states she has numbness and tingling of bilateral index fingers. She states her current pain as a 5-6/10, at its worst it was a 5-6/10. She states at the restaurant she had some pressure in her upper chest which she felt was from anxiety.  Family history father died at age 76 from a stroke, paternal grandmother died of a stroke in her 64s.  PCP Dr Nicholos Johns  Past Medical History  Diagnosis Date  . Hypertension     hx of essential- on meds  . Hypercholesterolemia   . Diabetes mellitus      mild  . Exogenous obesity     She has had a problem with  . Elevated liver function tests     One of her studies are slightly elevated which may represent fatty liver from her Diabetes and her obesity or may represent an adverse effect from the Crestor.  . Infertility   . Leg cramps     in last 6 months   Past Surgical History  Procedure Laterality Date  . Cardiovascular stress test  06-29-2002    EF is normal at 55%  . Ankle surgery    . Cervical disc surgery  2001    c spine fusion  . Dilation and curettage of uterus    . Laparoscopy      multiple s/p infertility  . Pilonidal cyst excision    . Knee arthroscopy with meniscal repair  2/14    right knee   Family History  Problem Relation Age of Onset  . Diabetes Father   . Hypertension Father   . Hypertension Paternal Grandmother   . Heart Problems Mother     aortic valve replacement  . CVA Father   . CVA Paternal Grandmother    History  Substance Use Topics  . Smoking status: Never Smoker   . Smokeless tobacco: Never Used  . Alcohol Use: 2.5 oz/week    5 drink(s) per week     Comment: Wine  Lives with spouse employed   OB History   Grav Para Term Preterm Abortions TAB SAB Ect Mult Living   1 1        1      Obstetric Comments   And one adopted child     Review of Systems  All other systems reviewed and are negative.     Allergies  Lipitor; Onion; Bactrim; and Sulfa antibiotics  Home Medications   Prior to Admission medications   Medication Sig Start Date End Date Taking? Authorizing Provider  B Complex Vitamins (B COMPLEX PO) Take 1 tablet by mouth daily.    Yes Historical Provider, MD  BIOTIN PO Take 1 tablet by mouth daily.   Yes Historical Provider, MD  CALCIUM PO Take 1,000 mg by mouth daily.    Yes Historical Provider, MD  cetirizine (ZYRTEC) 10 MG tablet Take 10 mg by mouth daily.   Yes Historical Provider, MD  Cholecalciferol (VITAMIN D PO) Take 1 tablet by mouth daily.    Yes  Historical Provider, MD  ibuprofen (ADVIL,MOTRIN) 200 MG tablet Take 400 mg by mouth every 6 (six) hours as needed for moderate pain.   Yes Historical Provider, MD  losartan (COZAAR) 50 MG tablet Take 50 mg by mouth daily.   Yes Historical Provider, MD  Multiple Vitamins-Minerals (AIRBORNE) CHEW Chew 3 each by mouth daily as needed (for cold).   Yes Historical Provider, MD  OVER THE COUNTER MEDICATION Apply 1 application topically daily as needed (for ear itching).   Yes Historical Provider, MD  potassium chloride SA (K-DUR,KLOR-CON) 20 MEQ tablet Take 20 mEq by mouth 2 (two) times daily.   Yes Historical Provider, MD  rosuvastatin (CRESTOR) 5 MG tablet Take 5 mg by mouth every morning.   Yes Historical Provider, MD  Saxagliptin-Metformin (KOMBIGLYZE XR) 06-998 MG TB24 Take 1 tablet by mouth daily.    Yes Historical Provider, MD  zolpidem (AMBIEN) 10 MG tablet Take 10 mg by mouth at bedtime.   Yes Historical Provider, MD   BP 131/70  Pulse 87  Temp(Src) 98.1 F (36.7 C) (Oral)  Resp 15  Wt 207 lb 5 oz (94.036 kg)  SpO2 99%  LMP 02/05/2006  Vital signs normal   Physical Exam  Nursing note and vitals reviewed. Constitutional: She is oriented to person, place, and time. She appears well-developed and well-nourished.  Non-toxic appearance. She does not appear ill. No distress.  HENT:  Head: Normocephalic and atraumatic.  Right Ear: External ear normal.  Left Ear: External ear normal.  Nose: Nose normal. No mucosal edema or rhinorrhea.  Mouth/Throat: Oropharynx is clear and moist and mucous membranes are normal. No dental abscesses or uvula swelling.  Eyes: Conjunctivae and EOM are normal. Pupils are equal, round, and reactive to light.  Neck: Normal range of motion and full passive range of motion without pain. Neck supple.  Cardiovascular: Normal rate, regular rhythm and normal heart sounds.  Exam reveals no gallop and no friction rub.   No murmur heard. Pulmonary/Chest: Effort normal  and breath sounds normal. No respiratory distress. She has no wheezes. She has no rhonchi. She has no rales. She exhibits no tenderness and no crepitus.  Abdominal: Soft. Normal appearance and bowel sounds are normal. She exhibits no distension. There is no tenderness. There is no rebound and no guarding.  Musculoskeletal: Normal range of motion. She exhibits no edema and no tenderness.  Moves all extremities well.   Neurological: She is alert and oriented to person, place,  and time. She has normal strength. No cranial nerve deficit.  Cranial nerves II through XII intact, there is no facial asymmetry, there is no pronator drift, there is no motor weakness noted.  Skin: Skin is warm, dry and intact. No rash noted. No erythema. No pallor.  Psychiatric: She has a normal mood and affect. Her speech is normal and behavior is normal. Her mood appears not anxious.    ED Course  Procedures (including critical care time)  Medications  sodium chloride 0.9 % bolus 1,000 mL (1,000 mLs Intravenous New Bag/Given 09/01/13 2258)  metoCLOPramide (REGLAN) injection 10 mg (10 mg Intravenous Given 09/01/13 2250)  diphenhydrAMINE (BENADRYL) injection 25 mg (25 mg Intravenous Given 09/01/13 2251)   Patient given Reglan and Benadryl for her complaint of headache. We discussed her symptoms were suggestive of a TIA. Patient is agreeable for admission for further evaluation.  21:52 Dr Pincus LargeKrikpatrick, neurology, will see patient.   22:10 Dr Amada JupiterKirkpatrick has seen patient, agrees with admission for TIA evaluation   23:46 Dr Mort SawyersH Jenkins, admit to obs, tele, team 10  Labs Review Results for orders placed during the hospital encounter of 09/01/13  [facility]      Result Value Ref Range   Prothrombin Time 13.3  11.6 - 15.2 seconds   INR 1.01  0.00 - 1.49  APTT      Result Value Ref Range   aPTT 29  24 - 37 seconds  CBC      Result Value Ref Range   WBC 6.7  4.0 - 10.5 K/uL   RBC 4.58  3.87 - 5.11 MIL/uL   Hemoglobin  13.4  12.0 - 15.0 g/dL   HCT 09.840.0  11.936.0 - 14.746.0 %   MCV 87.3  78.0 - 100.0 fL   MCH 29.3  26.0 - 34.0 pg   MCHC 33.5  30.0 - 36.0 g/dL   RDW 82.913.0  56.211.5 - 13.015.5 %   Platelets 172  150 - 400 K/uL  DIFFERENTIAL      Result Value Ref Range   Neutrophils Relative % 65  43 - 77 %   Neutro Abs 4.3  1.7 - 7.7 K/uL   Lymphocytes Relative 25  12 - 46 %   Lymphs Abs 1.7  0.7 - 4.0 K/uL   Monocytes Relative 5  3 - 12 %   Monocytes Absolute 0.4  0.1 - 1.0 K/uL   Eosinophils Relative 5  0 - 5 %   Eosinophils Absolute 0.3  0.0 - 0.7 K/uL   Basophils Relative 0  0 - 1 %   Basophils Absolute 0.0  0.0 - 0.1 K/uL  COMPREHENSIVE METABOLIC PANEL      Result Value Ref Range   Sodium 141  137 - 147 mEq/L   Potassium 3.7  3.7 - 5.3 mEq/L   Chloride 103  96 - 112 mEq/L   CO2 22  19 - 32 mEq/L   Glucose, Bld 172 (*) 70 - 99 mg/dL   BUN 12  6 - 23 mg/dL   Creatinine, Ser 8.650.65  0.50 - 1.10 mg/dL   Calcium 9.5  8.4 - 78.410.5 mg/dL   Total Protein 6.8  6.0 - 8.3 g/dL   Albumin 4.0  3.5 - 5.2 g/dL   AST 18  0 - 37 U/L   ALT 19  0 - 35 U/L   Alkaline Phosphatase 78  39 - 117 U/L   Total Bilirubin 0.3  0.3 - 1.2 mg/dL   GFR calc  non Af Amer >90  >90 mL/min   GFR calc Af Amer >90  >90 mL/min   Anion gap 16 (*) 5 - 15  URINALYSIS, ROUTINE W REFLEX MICROSCOPIC      Result Value Ref Range   Color, Urine YELLOW  YELLOW   APPearance CLEAR  CLEAR   Specific Gravity, Urine 1.013  1.005 - 1.030   pH 6.5  5.0 - 8.0   Glucose, UA NEGATIVE  NEGATIVE mg/dL   Hgb urine dipstick NEGATIVE  NEGATIVE   Bilirubin Urine NEGATIVE  NEGATIVE   Ketones, ur NEGATIVE  NEGATIVE mg/dL   Protein, ur NEGATIVE  NEGATIVE mg/dL   Urobilinogen, UA 0.2  0.0 - 1.0 mg/dL   Nitrite NEGATIVE  NEGATIVE   Leukocytes, UA SMALL (*) NEGATIVE  URINE MICROSCOPIC-ADD ON      Result Value Ref Range   Squamous Epithelial / LPF RARE  RARE   WBC, UA 3-6  <3 WBC/hpf   Bacteria, UA RARE  RARE  CBG MONITORING, ED      Result Value Ref Range    Glucose-Capillary 165 (*) 70 - 99 mg/dL   Comment 1 Notify RN    I-STAT CHEM 8, ED      Result Value Ref Range   Sodium 139  137 - 147 mEq/L   Potassium 3.5 (*) 3.7 - 5.3 mEq/L   Chloride 109  96 - 112 mEq/L   BUN 11  6 - 23 mg/dL   Creatinine, Ser 1.61  0.50 - 1.10 mg/dL   Glucose, Bld 096 (*) 70 - 99 mg/dL   Calcium, Ion 0.45  4.09 - 1.30 mmol/L   TCO2 21  0 - 100 mmol/L   Hemoglobin 13.6  12.0 - 15.0 g/dL   HCT 81.1  91.4 - 78.2 %  I-STAT TROPOININ, ED      Result Value Ref Range   Troponin i, poc 0.00  0.00 - 0.08 ng/mL   Comment 3            Laboratory interpretation all normal except mild hypokalemia, hyperglycemia    Imaging Review Ct Head (brain) Wo Contrast  09/01/2013   CLINICAL DATA:  Headache, aphasia.  EXAM: CT HEAD WITHOUT CONTRAST  TECHNIQUE: Contiguous axial images were obtained from the base of the skull through the vertex without intravenous contrast.  COMPARISON:  None.  FINDINGS: There is no acute intracranial hemorrhage or infarct. No mass lesion or midline shift. Gray-white matter differentiation is well maintained. Ventricles are normal in size without evidence of hydrocephalus. CSF containing spaces are within normal limits. No extra-axial fluid collection.  The calvarium is intact.  Orbital soft tissues are within normal limits.  The paranasal sinuses and mastoid air cells are well pneumatized and free of fluid.  Scalp soft tissues are unremarkable.  IMPRESSION: No acute intracranial abnormality.   Electronically Signed   By: Rise Mu M.D.   On: 09/01/2013 20:44     EKG Interpretation   Date/Time:  Tuesday September 01 2013 23:09:51 EDT Ventricular Rate:  87 PR Interval:  147 QRS Duration: 96 QT Interval:  382 QTC Calculation: 459 R Axis:   47 Text Interpretation:  Sinus rhythm Normal ECG No significant change since  last tracing  25 Apri 2003 Confirmed by Manisha Cancel  MD-I, Belle Charlie ([id number]) on  09/01/2013 11:19:35 PM      MDM   Final diagnoses:   Amaurosis fugax  Expressive aphasia  Headache, unspecified headache type  Transient cerebral ischemia, unspecified transient cerebral  ischemia type     Plan admission  Devoria Albe, MD, Franz Dell, MD 09/01/13 [phone]

## 2013-09-02 ENCOUNTER — Encounter (HOSPITAL_COMMUNITY): Payer: Self-pay | Admitting: General Practice

## 2013-09-02 ENCOUNTER — Observation Stay (HOSPITAL_COMMUNITY): Payer: Commercial Managed Care - PPO

## 2013-09-02 DIAGNOSIS — E785 Hyperlipidemia, unspecified: Secondary | ICD-10-CM

## 2013-09-02 DIAGNOSIS — I519 Heart disease, unspecified: Secondary | ICD-10-CM

## 2013-09-02 DIAGNOSIS — I119 Hypertensive heart disease without heart failure: Secondary | ICD-10-CM

## 2013-09-02 DIAGNOSIS — E1165 Type 2 diabetes mellitus with hyperglycemia: Secondary | ICD-10-CM

## 2013-09-02 DIAGNOSIS — G459 Transient cerebral ischemic attack, unspecified: Secondary | ICD-10-CM

## 2013-09-02 DIAGNOSIS — IMO0001 Reserved for inherently not codable concepts without codable children: Secondary | ICD-10-CM

## 2013-09-02 DIAGNOSIS — H3400 Transient retinal artery occlusion, unspecified eye: Secondary | ICD-10-CM

## 2013-09-02 DIAGNOSIS — R51 Headache: Secondary | ICD-10-CM

## 2013-09-02 LAB — RAPID URINE DRUG SCREEN, HOSP PERFORMED
Amphetamines: NOT DETECTED
Barbiturates: NOT DETECTED
Benzodiazepines: NOT DETECTED
Cocaine: NOT DETECTED
Opiates: NOT DETECTED
Tetrahydrocannabinol: NOT DETECTED

## 2013-09-02 LAB — LIPID PANEL
Cholesterol: 145 mg/dL (ref 0–200)
HDL: 55 mg/dL (ref >39)
LDL Cholesterol: 74 mg/dL (ref 0–99)
Total CHOL/HDL Ratio: 2.6 RATIO
Triglycerides: 81 mg/dL (ref <150)
VLDL: 16 mg/dL (ref 0–40)

## 2013-09-02 LAB — HEMOGLOBIN A1C
Hgb A1c MFr Bld: 6.1 % — ABNORMAL HIGH (ref <5.7)
Mean Plasma Glucose: 128 mg/dL — ABNORMAL HIGH (ref <117)

## 2013-09-02 LAB — GLUCOSE, CAPILLARY
Glucose-Capillary: 120 mg/dL — ABNORMAL HIGH (ref 70–99)
Glucose-Capillary: 182 mg/dL — ABNORMAL HIGH (ref 70–99)

## 2013-09-02 LAB — SEDIMENTATION RATE: Sed Rate: 4 mm/hr (ref 0–22)

## 2013-09-02 MED ORDER — ACETAMINOPHEN 325 MG PO TABS: 650.0000 mg | ORAL_TABLET | ORAL | Status: DC | PRN

## 2013-09-02 MED ORDER — STUDY - INVESTIGATIONAL DRUG SIMPLE RECORD: 90.0000 mg | Freq: Two times a day (BID) | Status: DC

## 2013-09-02 MED ORDER — ENOXAPARIN SODIUM 40 MG/0.4ML ~~LOC~~ SOLN
40.0000 mg | Freq: Every day | SUBCUTANEOUS | Status: DC
  Administered 2013-09-02: 40 mg via SUBCUTANEOUS
  Filled 2013-09-02: qty 0.4

## 2013-09-02 MED ORDER — STUDY - INVESTIGATIONAL DRUG SIMPLE RECORD: 100.0000 mg | Freq: Every day | Status: DC

## 2013-09-02 MED ORDER — LOSARTAN POTASSIUM 50 MG PO TABS
50.0000 mg | ORAL_TABLET | Freq: Every day | ORAL | Status: DC
Start: 2013-09-02 — End: 2013-09-02
  Administered 2013-09-02: 50 mg via ORAL
  Filled 2013-09-02: qty 1

## 2013-09-02 MED ORDER — ASPIRIN 325 MG PO TABS
325.0000 mg | ORAL_TABLET | Freq: Every day | ORAL | Status: DC
  Administered 2013-09-02: 325 mg via ORAL
  Filled 2013-09-02: qty 1

## 2013-09-02 MED ORDER — STROKE: EARLY STAGES OF RECOVERY BOOK
Freq: Once | Status: DC
  Filled 2013-09-02: qty 1

## 2013-09-02 MED ORDER — TRAMADOL HCL 50 MG PO TABS: 50.0000 mg | ORAL_TABLET | Freq: Three times a day (TID) | ORAL | Status: DC | PRN

## 2013-09-02 MED ORDER — ZOLPIDEM TARTRATE 5 MG PO TABS
5.0000 mg | ORAL_TABLET | Freq: Every day | ORAL | Status: DC
  Administered 2013-09-02: 5 mg via ORAL
  Filled 2013-09-02: qty 1

## 2013-09-02 MED ORDER — INSULIN ASPART 100 UNIT/ML ~~LOC~~ SOLN: 0.0000 [IU] | Freq: Every day | SUBCUTANEOUS | Status: DC

## 2013-09-02 MED ORDER — POTASSIUM CHLORIDE CRYS ER 20 MEQ PO TBCR
20.0000 meq | EXTENDED_RELEASE_TABLET | Freq: Two times a day (BID) | ORAL | Status: DC
  Administered 2013-09-02 (×2): 20 meq via ORAL
  Filled 2013-09-02 (×3): qty 1

## 2013-09-02 MED ORDER — LORAZEPAM 2 MG/ML IJ SOLN
1.0000 mg | Freq: Once | INTRAMUSCULAR | Status: AC
  Administered 2013-09-02: 1 mg via INTRAVENOUS
  Filled 2013-09-02: qty 1

## 2013-09-02 MED ORDER — INSULIN ASPART 100 UNIT/ML ~~LOC~~ SOLN: 0.0000 [IU] | Freq: Three times a day (TID) | SUBCUTANEOUS | Status: DC

## 2013-09-02 MED ORDER — ROSUVASTATIN CALCIUM 5 MG PO TABS
5.0000 mg | ORAL_TABLET | Freq: Every day | ORAL | Status: DC
  Filled 2013-09-02 (×2): qty 1

## 2013-09-02 MED ORDER — VITAMIN D3 25 MCG (1000 UNIT) PO TABS
1000.0000 [IU] | ORAL_TABLET | Freq: Every day | ORAL | Status: DC
  Administered 2013-09-02: 1000 [IU] via ORAL
  Filled 2013-09-02: qty 1

## 2013-09-02 MED ORDER — STUDY - INVESTIGATIONAL DRUG SIMPLE RECORD
180.0000 mg | Freq: Once | Status: DC
  Filled 2013-09-02: qty 180

## 2013-09-02 MED ORDER — ASPIRIN EC 325 MG PO TBEC: 325.0000 mg | DELAYED_RELEASE_TABLET | Freq: Every day | ORAL | Status: DC

## 2013-09-02 MED ORDER — STUDY - INVESTIGATIONAL DRUG SIMPLE RECORD
300.0000 mg | Freq: Once | Status: DC
  Filled 2013-09-02: qty 300

## 2013-09-02 NOTE — Progress Notes (Signed)
Utilization Review Completed.Christina Wong T7/29/2015  

## 2013-09-02 NOTE — Progress Notes (Signed)
CSW (Clinical Child psychotherapistocial Worker) informed by MD that pt will need letter for airline stating pt cannot make flight on Saturday. CSW provided pt with letter. At this time, pt has no further hospital social work needs. Please consult if new needs arise.  Jilliana Burkes, LCSWA [phone]

## 2013-09-02 NOTE — Consult Note (Signed)
Neurology Consultation Reason for Consult: Transient right vision difficulty and difficulty speaking Referring Physician: Tomi Bamberger, I  CC: Transient right visual difficulty and difficulty speaking  History is obtained from: Patient  HPI: Christina Wong is a 63 y.o. female with a history of hypertension, high cholesterol, diabetes who presents with transient difficulty seeing started while she was at dinner. She states that she was looking at words and could only see half of the word, nothing in the see the right half. She then noticed that she was having trouble speaking. This lasted for somewhere around 10-20 minutes. It is now completely resolved.  Of note, she has been having headaches for the past few months the been getting more frequent, over the past week has had a headache everyday. It is left periorbital, not associated with photophobia or phonophobia, not associated with nausea and vomiting, non-throbbing in character. She denies jaw claudication.   LKW: 7 PM tpa given?: no, resolve symptoms    ROS: A 14 point ROS was performed and is negative except as noted in the HPI.   Past Medical History  Diagnosis Date  . Hypertension     hx of essential- on meds  . Hypercholesterolemia   . Diabetes mellitus     mild  . Exogenous obesity     She has had a problem with  . Elevated liver function tests     One of her studies are slightly elevated which may represent fatty liver from her Diabetes and her obesity or may represent an adverse effect from the Crestor.  . Infertility   . Leg cramps     in last 6 months    Family History: No history of migraine  Social History: Tob: Denies  Exam: Current vital signs: BP 115/64  Pulse 88  Temp(Src) 97.9 F (36.6 C) (Oral)  Resp 15  Wt 94.036 kg (207 lb 5 oz)  SpO2 98%  LMP 02/05/2006 Vital signs in last 24 hours: Temp:  [97.9 F (36.6 C)-98.1 F (36.7 C)] 97.9 F (36.6 C) (07/28 2211) Pulse Rate:  [86-102] 88 (07/28  2330) Resp:  [12-18] 15 (07/28 2330) BP: (115-158)/(64-86) 115/64 mmHg (07/28 2330) SpO2:  [97 %-99 %] 98 % (07/28 2330) Weight:  [94.036 kg (207 lb 5 oz)] 94.036 kg (207 lb 5 oz) (07/28 2004)  General: In bed, NAD CV: Regular rate and rhythm Mental Status: Patient is awake, alert, oriented to person, place, month, year, and situation. Immediate and remote memory are intact. Patient is able to give a clear and coherent history. No signs of aphasia or neglect Cranial Nerves: II: Visual Fields are full. Pupils are equal, round, and reactive to light.  Discs are difficult to visualize. III,IV, VI: EOMI without ptosis or diploplia.  V: Facial sensation is symmetric to temperature VII: Facial movement is symmetric.  VIII: hearing is intact to voice X: Uvula elevates symmetrically XI: Shoulder shrug is symmetric. XII: tongue is midline without atrophy or fasciculations.  Motor: Tone is normal. Bulk is normal. 5/5 strength was present in all four extremities.  Sensory: Sensation is symmetric to light touch and temperature in the arms and legs. Deep Tendon Reflexes: 2+ and symmetric in the biceps and patellae.  Cerebellar: FNF intact bilaterally Gait: Not tested secondary to multiple monitors in ED setting.        I have reviewed labs in epic and the results pertinent to this consultation are: Mildly low K.  I have reviewed the images obtained: CT head-unremarkable  Impression: 63 year old female with transient right vision difficulty as well as difficulty speaking concerning for TIA in a patient with multiple stroke risk factors. I would favor admission for risk stratification and modification. Without a history of migraines and with new headaches in addition to a TIA, I do think that checking a sedimentation rate to evaluate for possible temporal arteritis would be prudent. If elevated, would start prednisone.  Recommendations: 1. HgbA1c, fasting lipid panel 2. MRI, MRA  of  the brain without contrast 3. Frequent neuro checks 4. Echocardiogram 5. Carotid dopplers 6. Prophylactic therapy-Antiplatelet med: Aspirin - dose 345m PO or 3024mPR 7. Risk factor modification 8. Telemetry monitoring 9. PT consult, OT consult, Speech consult 10. ESR   McRoland RackMD [facility] [phone] 7pm- 7am, please page neurology [place] call [place]

## 2013-09-02 NOTE — Progress Notes (Signed)
  Echocardiogram 2D Echocardiogram has been performed.  [name], Christina Wong 09/02/2013, 11:39 AM

## 2013-09-02 NOTE — Progress Notes (Signed)
Bilateral carotid artery duplex completed:  1-39% ICA stenosis.  Vertebral artery flow is antegrade.     

## 2013-09-02 NOTE — H&P (Signed)
[facility] Admission History and Physical       Christina Wong:[id number] DOB: Feb 08, 1950 DOA: 09/01/2013  Referring physician:  EDP PCP: Christina Patella, MD  Specialists:   Chief Complaint:  Headache, Confusion, Loss of Vision  HPI: Christina Wong is a 63 y.o. female with a history of HTN, Hyperlipidemia and DM2 who presents to the ED with complaints of sudden onset of confusion and difficulty with word finding.   She reports also having headaches off and on for the past few days, but this afternoon the headaches was worse.    She reports not being able to see out of her right eye.   Overall, her symptoms lasted 45 minutes.  She denied having any weakness or fatigue associated with her symptoms.     Review of Systems:  Constitutional: No Weight Loss, No Weight Gain, Night Sweats, Fevers, Chills, Dizziness, Fatigue, or Generalized Weakness HEENT: +Headaches, Difficulty Swallowing,Tooth/Dental Problems,Sore Throat,  No Sneezing, Rhinitis, Ear Ache, Nasal Congestion, or Post Nasal Drip,  Cardio-vascular:  No Chest pain, Orthopnea, PND, Edema in Lower Extremities, Anasarca, Dizziness, Palpitations  Resp: No Dyspnea, No DOE, No Productive Cough, No Non-Productive Cough, No Hemoptysis, No Wheezing.    GI: No Heartburn, Indigestion, Abdominal Pain, Nausea, Vomiting, Diarrhea, Hematemesis, Hematochezia, Melena, Change in Bowel Habits,  Loss of Appetite  GU: No Dysuria, Change in Color of Urine, No Urgency or Frequency, No Flank pain.  Musculoskeletal: No Joint Pain or Swelling, No Decreased Range of Motion, No Back Pain.  Neurologic: No Syncope, No Seizures, Muscle Weakness, Paresthesia, +Vision Disturbance or Loss, No Diplopia, No Vertigo, +Difficulty Speaking, No Difficulty Walking,  Skin: No Rash or Lesions. Psych: No Change in Mood or Affect, No Depression or Anxiety, No Memory loss, +Confusion, or Hallucinations   Past Medical History  Diagnosis Date  . Hypertension      hx of essential- on meds  . Hypercholesterolemia   . Diabetes mellitus     mild  . Exogenous obesity     She has had a problem with  . Elevated liver function tests     One of her studies are slightly elevated which may represent fatty liver from her Diabetes and her obesity or may represent an adverse effect from the Crestor.  . Infertility   . Leg cramps     in last 6 months      Past Surgical History  Procedure Laterality Date  . Cardiovascular stress test  06-29-2002    EF is normal at 55%  . Ankle surgery    . Cervical disc surgery  2001    c spine fusion  . Dilation and curettage of uterus    . Laparoscopy      multiple s/p infertility  . Pilonidal cyst excision    . Knee arthroscopy with meniscal repair  2/14    right knee       Prior to Admission medications   Medication Sig Start Date End Date Taking? Authorizing Provider  B Complex Vitamins (B COMPLEX PO) Take 1 tablet by mouth daily.    Yes Historical Provider, MD  BIOTIN PO Take 1 tablet by mouth daily.   Yes Historical Provider, MD  CALCIUM PO Take 1,000 mg by mouth daily.    Yes Historical Provider, MD  cetirizine (ZYRTEC) 10 MG tablet Take 10 mg by mouth daily.   Yes Historical Provider, MD  Cholecalciferol (VITAMIN D PO) Take 1 tablet by mouth daily.    Yes Historical Provider,  MD  ibuprofen (ADVIL,MOTRIN) 200 MG tablet Take 400 mg by mouth every 6 (six) hours as needed for moderate pain.   Yes Historical Provider, MD  losartan (COZAAR) 50 MG tablet Take 50 mg by mouth daily.   Yes Historical Provider, MD  Multiple Vitamins-Minerals (AIRBORNE) CHEW Chew 3 each by mouth daily as needed (for cold).   Yes Historical Provider, MD  OVER THE COUNTER MEDICATION Apply 1 application topically daily as needed (for ear itching).   Yes Historical Provider, MD  potassium chloride SA (K-DUR,KLOR-CON) 20 MEQ tablet Take 20 mEq by mouth 2 (two) times daily.   Yes Historical Provider, MD  rosuvastatin (CRESTOR) 5 MG  tablet Take 5 mg by mouth every morning.   Yes Historical Provider, MD  Saxagliptin-Metformin (KOMBIGLYZE XR) 06-998 MG TB24 Take 1 tablet by mouth daily.    Yes Historical Provider, MD  zolpidem (AMBIEN) 10 MG tablet Take 10 mg by mouth at bedtime.   Yes Historical Provider, MD    Allergies  Allergen Reactions  . Lipitor [Atorvastatin] Other (See Comments)    Leg cramps  . Onion Nausea And Vomiting  . Bactrim [Sulfamethoxazole-Trimethoprim] Other (See Comments)    Muscle issues  . Sulfa Antibiotics Other (See Comments)    unknown   Social History:  reports that she has never smoked. She has never used smokeless tobacco. She reports that she drinks about 2.5 ounces of alcohol per week. She reports that she does not use illicit drugs.     Family History  Problem Relation Age of Onset  . Diabetes Father   . Hypertension Father   . Hypertension Paternal Grandmother   . Heart Problems Mother     aortic valve replacement  . CVA Father   . CVA Paternal Grandmother        Physical Exam:  GEN:  Pleasant Obese 63 y.o. Caucasian female examined and in no acute distress; cooperative with exam Filed Vitals:   09/01/13 2300 09/01/13 2330 09/02/13 0000 09/02/13 0015  BP: 139/67 115/64 126/67   Pulse: 86 88 84   Temp:      TempSrc:      Resp: 17 15 23 20   Weight:      SpO2: 98% 98% 97%    Blood pressure 126/67, pulse 84, temperature 97.9 F (36.6 C), temperature source Oral, resp. rate 20, weight 94.036 kg (207 lb 5 oz), last menstrual period 02/05/2006, SpO2 97.00%. PSYCH: SHe is alert and oriented x4; does not appear anxious does not appear depressed; affect is normal HEENT: Normocephalic and Atraumatic, Mucous membranes pink; PERRLA; EOM intact; Fundi:  Benign;  No scleral icterus, Nares: Patent, Oropharynx: Clear, Fair Dentition, Neck:  FROM, No Cervical Lymphadenopathy nor Thyromegaly or Carotid Bruit; No JVD; Breasts:: Not examined CHEST WALL: No tenderness CHEST: Normal  respiration, clear to auscultation bilaterally HEART: Regular rate and rhythm; no murmurs rubs or gallops BACK: No kyphosis or scoliosis; No CVA tenderness ABDOMEN: Positive Bowel Sounds, Obese, Soft Non-Tender; No Masses, No Organomegaly, No Pannus; No Intertriginous candida. Rectal Exam: Not done EXTREMITIES: No Cyanosis, Clubbing, or Edema; No Ulcerations. Genitalia: not examined PULSES: 2+ and symmetric SKIN: Normal hydration no rash or ulceration CNS:  Mental Status:  Alert, Oriented, Thought Content Appropriate. Speech Fluent without evidence of Aphasia. Able to follow 3 step commands without difficulty.  In No obvious pain.   Cranial Nerves:  II: Discs flat bilaterally; Visual fields Intact, Pupils equal and reactive.    III,IV, VI: Extra-ocular motions intact bilaterally  V,VII: smile symmetric, facial light touch sensation normal bilaterally    VIII: hearing intact bilaterally    IX,X: gag reflex present    XI: bilateral shoulder shrug    XII: midline tongue extension   Motor:  Right:  Upper extremity 5/5     Left:  Upper extremity 5/5     Right:  Lower extremity 5/5    Left:  Lower extremity 5/5     Tone and Bulk:  normal tone throughout; no atrophy noted   Sensory:  Pinprick and light touch intact throughout, bilaterally   Deep Tendon Reflexes: 2+ and symmetric throughout   Plantars/ Babinski:  Right:  normal Left:  normal    Cerebellar:  Finger to nose with or without difficulty.   Gait: deferred   Vascular: pulses palpable throughout    Labs on Admission:  Basic Metabolic Panel:  Recent Labs Lab 09/01/13 2014 09/01/13 2033  NA 141 139  K 3.7 3.5*  CL 103 109  CO2 22  --   GLUCOSE 172* 181*  BUN 12 11  CREATININE 0.65 0.80  CALCIUM 9.5  --    Liver Function Tests:  Recent Labs Lab 09/01/13 2014  AST 18  ALT 19  ALKPHOS 78  BILITOT 0.3  PROT 6.8  ALBUMIN 4.0   No results found for this basename: LIPASE, AMYLASE,  in the last 168 hours No  results found for this basename: AMMONIA,  in the last 168 hours CBC:  Recent Labs Lab 09/01/13 2014 09/01/13 2033  WBC 6.7  --   NEUTROABS 4.3  --   HGB 13.4 13.6  HCT 40.0 40.0  MCV 87.3  --   PLT 172  --    Cardiac Enzymes: No results found for this basename: CKTOTAL, CKMB, CKMBINDEX, TROPONINI,  in the last 168 hours  BNP (last 3 results) No results found for this basename: PROBNP,  in the last 8760 hours CBG:  Recent Labs Lab 09/01/13 2021  GLUCAP 165*    Radiological Exams on Admission: Ct Head (brain) Wo Contrast  09/01/2013   CLINICAL DATA:  Headache, aphasia.  EXAM: CT HEAD WITHOUT CONTRAST  TECHNIQUE: Contiguous axial images were obtained from the base of the skull through the vertex without intravenous contrast.  COMPARISON:  None.  FINDINGS: There is no acute intracranial hemorrhage or infarct. No mass lesion or midline shift. Gray-white matter differentiation is well maintained. Ventricles are normal in size without evidence of hydrocephalus. CSF containing spaces are within normal limits. No extra-axial fluid collection.  The calvarium is intact.  Orbital soft tissues are within normal limits.  The paranasal sinuses and mastoid air cells are well pneumatized and free of fluid.  Scalp soft tissues are unremarkable.  IMPRESSION: No acute intracranial abnormality.   Electronically Signed   By: Rise Mu M.D.   On: 09/01/2013 20:44     EKG: Independently reviewed.  Normal sinus Rhythm Rate  87, No Acute S-T Cahnges   Assessment/Plan:  63 y.o. female with  Principal Problem:   1.   TIA (transient ischemic attack)   TIA Workup ordered, MRI/MRA of Brain, Carotid US, and 2D Echo ,    Neuro Checks, and Neuro Consulted and following.   Active Problems:   2.   Type II or unspecified type diabetes mellitus without mention of complication, uncontrolled   On Kombiglyze, which is being held due to Metformin content and IV contrast    Studies which have been  ordered  3.    Dyslipidemia    Continue Crestor Rx, check Fasting Lipids.        4.    Benign hypertensive heart disease without heart failure   Continue Losartan RX, Monitor BPs, and For S/Sxs of fluid overload.        5.    DVT Prophylaxis   Lovenox ordered.          Code Status:  FULL CODE  Family Communication:  Husband at Bedside   Disposition Plan:       Time spent:  60 M[place]utes  Ron Parker [facility] Pager [phone]   If 7AM -7PM Please Contact the Day Rounding Team MD for [facility]  If 7PM-7AM, Please Contact night-coverage  [email or web address] Password [facility] 09/02/2013, 12:52 AM

## 2013-09-02 NOTE — Progress Notes (Signed)
Stroke Team Progress Note  HISTORY Christina Wong is a 63 y.o. female with a history of hypertension, high cholesterol, diabetes who presents with transient difficulty seeing started while she was at dinner 09/01/2013 at 7p. She states that she was looking at words and could only see half of the word, noting she could not see the right half. She then noticed that she was having trouble speaking. This lasted for somewhere around 10-20 minutes. It is now completely resolved. Of note, she has been having headaches for the past few months the been getting more frequent, over the past week has had a headache everyday. It is left periorbital, not associated with photophobia or phonophobia, not associated with nausea and vomiting, non-throbbing in character. She denies jaw claudication.  Patient was not administered TPA secondary to  resolved symptoms. She was admitted for further evaluation and treatment.  SUBJECTIVE Her husband Christina Wong and son Christina Wong are at the bedside.  Overall she feels her condition is completely resolved. She is concerned about her headaches prior to admission. On further history taking the headaches seem like muscle tension headaches as these seem  to be reproducible and relieved by local pressure at the back of the head. She denies any history of scalp tenderness, jaw claudication or loss of vision except for the episode yesterday. She had 2 episodes of TIA yesterday. The first one she lost peripheral vision in the right eye for a few minutes and 20 minutes later she had episode of speech difficulty with word finding as well as confusion and disorientation. This lasted for 45 minutes and improved by the time she reached the hospital  .  OBJECTIVE Most recent Vital Signs: Filed Vitals:   09/02/13 0200 09/02/13 0400 09/02/13 0739 09/02/13 1207  BP: 140/74 117/69 124/67 137/86  Pulse: 87 69 66 70  Temp:  98.1 F (36.7 C) 98.2 F (36.8 C) 97.8 F (36.6 C)  TempSrc:  Oral Oral Oral  Resp:  _0 Weight:      SpO2: 98% 97% 97% 98%   CBG (last 3)   Recent Labs  09/01/13 2021 09/02/13 1206  GLUCAP 165* 120*    IV Fluid Intake:     MEDICATIONS  .  stroke: mapping our early stages of recovery book   Does not apply Once  . aspirin  325 mg Oral Daily  . cholecalciferol  1,000 Units Oral Daily  . enoxaparin (LOVENOX) injection  40 mg Subcutaneous Daily  . insulin aspart  0-5 Units Subcutaneous QHS  . insulin aspart  0-9 Units Subcutaneous TID WC  . losartan  50 mg Oral Daily  . potassium chloride SA  20 mEq Oral BID  . rosuvastatin  5 mg Oral QHS  . zolpidem  5 mg Oral QHS   PRN:  acetaminophen  Diet:  Carb Control thin liquids Activity:  As tolerated DVT Prophylaxis:  Lovenox 40 mg sq daily   CLINICALLY SIGNIFICANT STUDIES Basic Metabolic Panel:   Recent Labs Lab 09/01/13 2014 09/01/13 2033  NA 141 139  K 3.7 3.5*  CL 103 109  CO2 22  --   GLUCOSE 172* 181*  BUN 12 11  CREATININE 0.65 0.80  CALCIUM 9.5  --    Liver Function Tests:   Recent Labs Lab 09/01/13 2014  AST 18  ALT 19  ALKPHOS 78  BILITOT 0.3  PROT 6.8  ALBUMIN 4.0   CBC:   Recent Labs Lab 09/01/13 2014 09/01/13 2033  WBC  6.7  --   NEUTROABS 4.3  --   HGB 13.4 13.6  HCT 40.0 40.0  MCV 87.3  --   PLT 172  --    Coagulation:   Recent Labs Lab 09/01/13 2014  LABPROT 13.3  INR 1.01   Cardiac Enzymes: No results found for this basename: CKTOTAL, CKMB, CKMBINDEX, TROPONINI,  in the last 168 hours Urinalysis:   Recent Labs Lab 09/01/13 2028  COLORURINE YELLOW  LABSPEC 1.013  PHURINE 6.5  GLUCOSEU NEGATIVE  HGBUR NEGATIVE  BILIRUBINUR NEGATIVE  KETONESUR NEGATIVE  PROTEINUR NEGATIVE  UROBILINOGEN 0.2  NITRITE NEGATIVE  LEUKOCYTESUR SMALL*   Lipid Panel    Component Value Date/Time   CHOL 145 09/02/2013 0448   TRIG 81 09/02/2013 0448   HDL 55 09/02/2013 0448   CHOLHDL 2.6 09/02/2013 0448   VLDL 16 09/02/2013 0448   LDLCALC 74 09/02/2013 0448    HgbA1C  No results found for this basename: HGBA1C    Urine Drug Screen:     Component Value Date/Time   LABOPIA NONE DETECTED 09/02/2013 0808   COCAINSCRNUR NONE DETECTED 09/02/2013 0808   LABBENZ NONE DETECTED 09/02/2013 0808   AMPHETMU NONE DETECTED 09/02/2013 0808   THCU NONE DETECTED 09/02/2013 0808   LABBARB NONE DETECTED 09/02/2013 0808    Alcohol Level: No results found for this basename: ETH,  in the last 168 hours  CT of the brain  09/01/2013    No acute intracranial abnormality.    MRI of the brain  09/02/2013    No acute intracranial abnormality  MRA of the brain  09/02/2013   Mild chronic microvascular ischemic changes in the white matter, typical for age.  Negative MRA head    Carotid Doppler  No evidence of hemodynamically significant internal carotid artery stenosis. Vertebral artery flow is antegrade.   2D Echocardiogram  EF 45-50% with no source of embolus.   EKG  normal sinus rhythm. For complete results please see formal report.   Therapy Recommendations    Physical Exam  ABCD score 4   ASSESSMENT [name] is a 63 y.o. female presenting with Transient right vision difficulty and difficulty speaking > 10 mings. Did not receive IV t-PA secondary to resolved symptoms. Imaging negative for acute infarct. Dx: left brain TIA. On no anticoagulants prior to admission. Now on aspirin 325 mg orally every day for secondary stroke prevention. Patient with no resultant neuro deficits. Stroke work up underway.  Hypertension, cozaar continued in hospital, BP 115-137/64-86 past 24h Hyperlipidemia, LDL 76,  crestor 5 mg daily continued in hospital, goal < 70 for diabetics Diabetes, HgbA1c pending, goal < 7.0 Hx migraines after the birth of her child years ago. Has not had migraine since, however with ongoing headaches the past few months that sounds like muscle tension. ESR normal  Given new onset stroke symptoms that occurred yesterday, the patient is at risk for  neurologic progression to stroke. The 90-day risk of stroke after a transient ischemic attack has been estimated to be approximately 10 percent, with one half of strokes occurring within the first two days of the attack.  Hospital day # 1  TREATMENT/PLAN  Continue aspirin 325 mg orally every day for secondary stroke prevention. Patient interested in considering SOCRATES research study for secondary stroke prevention. Dr. Tilden Dome spoke with patient and family. [facility] will follow up. Socrates is a comparison of 90-day treatment with ticagrelor vs aspirin for the prevention of major vascular events in patients  with acute ischaemic stroke or TIA.  F/u HgbA1c No therapy needs   Burnetta Sabin, MSN, RN, ANVP-BC, ANP-BC, GNP-BC [facility] Pager: [phone] 09/02/2013 1:31 PM I have personally examined this patient, reviewed notes, independently viewed imaging studies, participated in medical decision making and plan of care. I have made any additions or clarifications directly to the above note. Agree with note above. I think the patient had a posterior dislocation TIA and has a high ABCD 2 score of 4 which puts her at high risk for recurrent stroke and TIA. This risk is 10-15% over the next one year with most of the risk being up front. I spent 35 minutes time with the patient and her husband discussing her stroke risk, personally reviewed imaging studies and lab results, discussed stroke risk stratification and answered questions. The patient may consider possible participation in the Socrates trial for stroke prevention if interested. She plans to go to [place] state coming Saturday for a week and I counseled her against doing so due to risk for recurrent stroke and TIAs  Antony Contras, MD Medical Director [place] Pager: [phone] 09/02/2013 5:04 PM  SIGNED    To contact Stroke Continuity provider, please refer to [email or web address] After  hours, contact General Neurology

## 2013-09-02 NOTE — Discharge Summary (Signed)
Physician Discharge Summary  Patient ID: Christina Wong MRN: [id number] DOB/AGE: [birth year]Noel Wong 63 Wong.o.  Admit date: 09/01/2013 Discharge date: 09/02/2013  Primary Care Physician:  Lolita PatellaEADE,ROBERT ALEXANDER, MD  Discharge Diagnoses:    . TIA (transient ischemic attack) . Type II or unspecified type diabetes mellitus without mention of complication, uncontrolled . Dyslipidemia . Benign hypertensive heart disease without heart failure  Consults: Neurology, Dr. Pearlean BrownieSethi   Recommendations for Outpatient Follow-up:  Please note that patient is currently enrolled in the Socrates trial for TIA  Her HbA1c is 6.1. Please follow CBGs and adjust her medications.  Allergies:   Allergies  Allergen Reactions  . Lipitor [Atorvastatin] Other (See Comments)    Leg cramps  . Onion Nausea And Vomiting  . Bactrim [Sulfamethoxazole-Trimethoprim] Other (See Comments)    Muscle issues  . Sulfa Antibiotics Other (See Comments)    unknown     Discharge Medications:   Medication List         AIRBORNE Chew  Chew 3 each by mouth daily as needed (for cold).     B COMPLEX PO  Take 1 tablet by mouth daily.     BIOTIN PO  Take 1 tablet by mouth daily.     CALCIUM PO  Take 1,000 mg by mouth daily.     cetirizine 10 MG tablet  Commonly known as:  ZYRTEC  Take 10 mg by mouth daily.     ibuprofen 200 MG tablet  Commonly known as:  ADVIL,MOTRIN  Take 400 mg by mouth every 6 (six) hours as needed for moderate pain.     KOMBIGLYZE XR 06-998 MG Tb24  Generic drug:  Saxagliptin-Metformin  Take 1 tablet by mouth daily.     losartan 50 MG tablet  Commonly known as:  COZAAR  Take 50 mg by mouth daily.     OVER THE COUNTER MEDICATION  Apply 1 application topically daily as needed (for ear itching).     potassium chloride SA 20 MEQ tablet  Commonly known as:  K-DUR,KLOR-CON  Take 20 mEq by mouth 2 (two) times daily.     research study medication  Take 100 mg by mouth daily.  Start taking on:   09/03/2013     research study medication  Take 90 mg by mouth 2 (two) times daily.  Start taking on:  09/03/2013     rosuvastatin 5 MG tablet  Commonly known as:  CRESTOR  Take 5 mg by mouth every morning.     VITAMIN D PO  Take 1 tablet by mouth daily.     zolpidem 10 MG tablet  Commonly known as:  AMBIEN  Take 10 mg by mouth at bedtime.         Brief H and P: For complete details please refer to admission H and P, but in brief Christina Wong is a 63 Wong.o. female with a history of HTN, Hyperlipidemia and DM2 who presented to the ED with complaints of sudden onset of confusion and difficulty with word finding. She reported also having headaches off and on for the past few days, but this afternoon the headaches was worse. She reports not being able to see out of her right eye. Overall, her symptoms lasted 45 minutes. She denied having any weakness or fatigue associated with her symptoms   Hospital Course:     TIA (transient ischemic attack); The patient was admitted for TIA workup. Her symptoms had improved. Neurology was consulted. Her status being hypertension, hyperlipidemia and diabetes,  on aspirin 81 mg prior to admission. MRI of the brain showed no acute infarct, hemorrhage or any mass effect. MRA was negative for any stenosis, occlusion or thrombus. 2-D echo showed EF of 45-50% with grade 1 diastolic dysfunction, no regional wall motion abnormality. Carotid Dopplers showed 1-39% ICA stenosis. Lipid panel showed cholesterol 145, LDL 74. Patient was seen by neurology/stroke service and recommended Socrates trial. Patient agreed to be enrolled in the trial.     Type II or unspecified type diabetes mellitus without mention of complication, uncontrolled - Hemoglobin A1c is 6.1. Her glycemic control inpatient was somewhat uncontrolled and was placed on sliding scale insulin.    Dyslipidemia -Continue Crestor     Benign hypertensive heart disease without heart failure - Currently  stable, EF 45-50%, continue Cozaar.  Day of Discharge BP 133/65  Pulse 75  Temp(Src) 97.5 F (36.4 C) (Oral)  Resp 17  Wt 92.851 kg (204 lb 11.2 oz)  SpO2 97%  LMP 02/05/2006  Physical Exam: General: Alert and awake oriented x3 not in any acute distress. HEENT: anicteric sclera, pupils reactive to light and accommodation CVS: S1-S2 clear no murmur rubs or gallops Chest: clear to auscultation bilaterally, no wheezing rales or rhonchi Abdomen: soft nontender, nondistended, normal bowel sounds Extremities: no cyanosis, clubbing or edema noted bilaterally Neuro: Cranial nerves II-XII intact, no focal neurological deficits   The results of significant diagnostics from this hospitalization (including imaging, microbiology, ancillary and laboratory) are listed below for reference.    LAB RESULTS: Basic Metabolic Panel:  Recent Labs Lab 09/01/13 2014 09/01/13 2033  NA 141 139  K 3.7 3.5*  CL 103 109  CO2 22  --   GLUCOSE 172* 181*  BUN 12 11  CREATININE 0.65 0.80  CALCIUM 9.5  --    Liver Function Tests:  Recent Labs Lab 09/01/13 2014  AST 18  ALT 19  ALKPHOS 78  BILITOT 0.3  PROT 6.8  ALBUMIN 4.0   No results found for this basename: LIPASE, AMYLASE,  in the last 168 hours No results found for this basename: AMMONIA,  in the last 168 hours CBC:  Recent Labs Lab 09/01/13 2014 09/01/13 2033  WBC 6.7  --   NEUTROABS 4.3  --   HGB 13.4 13.6  HCT 40.0 40.0  MCV 87.3  --   PLT 172  --    Cardiac Enzymes: No results found for this basename: CKTOTAL, CKMB, CKMBINDEX, TROPONINI,  in the last 168 hours BNP: No components found with this basename: POCBNP,  CBG:  Recent Labs Lab 09/01/13 2021 09/02/13 1206  GLUCAP 165* 120*    Significant Diagnostic Studies:  Ct Head (brain) Wo Contrast  09/01/2013   CLINICAL DATA:  Headache, aphasia.  EXAM: CT HEAD WITHOUT CONTRAST  TECHNIQUE: Contiguous axial images were obtained from the base of the skull through the  vertex without intravenous contrast.  COMPARISON:  None.  FINDINGS: There is no acute intracranial hemorrhage or infarct. No mass lesion or midline shift. Gray-white matter differentiation is well maintained. Ventricles are normal in size without evidence of hydrocephalus. CSF containing spaces are within normal limits. No extra-axial fluid collection.  The calvarium is intact.  Orbital soft tissues are within normal limits.  The paranasal sinuses and mastoid air cells are well pneumatized and free of fluid.  Scalp soft tissues are unremarkable.  IMPRESSION: No acute intracranial abnormality.   Electronically Signed   By: Rise Mu M.D.   On: 09/01/2013 20:44   Mri  Brain Without Contrast  09/02/2013   CLINICAL DATA:  TIA  EXAM: MRI HEAD WITHOUT CONTRAST  MRA HEAD WITHOUT CONTRAST  TECHNIQUE: Multiplanar, multiecho pulse sequences of the brain and surrounding structures were obtained without intravenous contrast. Angiographic images of the head were obtained using MRA technique without contrast.  COMPARISON:  CT head 09/01/2013  FINDINGS: MRI HEAD FINDINGS  Ventricle size is normal. Cerebral volume is normal. Pituitary normal in size.  Minimal chronic white matter changes are present consistent with microvascular ischemia. Basal ganglia and brainstem are normal.  Negative for acute infarct.  Negative for hemorrhage or mass.  Paranasal sinuses are clear.  MRA HEAD FINDINGS  Left vertebral artery dominant. Small right vertebral artery contributes to the basilar. Right PICA patent. Left PICA not visualized. AICA, superior cerebellar, and posterior cerebral arteries are patent bilaterally and appear normal. Left posterior communicating artery is patent.  Cervical internal carotid artery and cavernous carotid artery widely patent bilaterally. Anterior and middle cerebral arteries are patent and appear normal.  IMPRESSION: No acute intracranial abnormality. Mild chronic microvascular ischemic changes in the  white matter, typical for age.  Negative MRA head   Electronically Signed   By: Marlan Palau M.D.   On: 09/02/2013 11:06   Mr Maxine Glenn Head/brain Wo Cm  09/02/2013   CLINICAL DATA:  TIA  EXAM: MRI HEAD WITHOUT CONTRAST  MRA HEAD WITHOUT CONTRAST  TECHNIQUE: Multiplanar, multiecho pulse sequences of the brain and surrounding structures were obtained without intravenous contrast. Angiographic images of the head were obtained using MRA technique without contrast.  COMPARISON:  CT head 09/01/2013  FINDINGS: MRI HEAD FINDINGS  Ventricle size is normal. Cerebral volume is normal. Pituitary normal in size.  Minimal chronic white matter changes are present consistent with microvascular ischemia. Basal ganglia and brainstem are normal.  Negative for acute infarct.  Negative for hemorrhage or mass.  Paranasal sinuses are clear.  MRA HEAD FINDINGS  Left vertebral artery dominant. Small right vertebral artery contributes to the basilar. Right PICA patent. Left PICA not visualized. AICA, superior cerebellar, and posterior cerebral arteries are patent bilaterally and appear normal. Left posterior communicating artery is patent.  Cervical internal carotid artery and cavernous carotid artery widely patent bilaterally. Anterior and middle cerebral arteries are patent and appear normal.  IMPRESSION: No acute intracranial abnormality. Mild chronic microvascular ischemic changes in the white matter, typical for age.  Negative MRA head   Electronically Signed   By: Marlan Palau M.D.   On: 09/02/2013 11:06    2D ECHO: Study Conclusions  - Left ventricle: The cavity size was normal. Systolic function was mildly reduced. The estimated ejection fraction was in the range of 45% to 50%. Wall motion was normal; there were no regional wall motion abnormalities. Doppler parameters are consistent with abnormal left ventricular relaxation (grade 1 diastolic dysfunction). - Atrial septum: No defect or patent foramen ovale was  identified.  Impressions:  - No cardiac source of emboli was indentified.    Disposition and Follow-up: Discharge Instructions   Diet Carb Modified    Complete by:  As directed      Increase activity slowly    Complete by:  As directed             DISPOSITION: Home  DIET: Heart healthy diet     DISCHARGE FOLLOW-UP Follow-up Information   Follow up with Lolita Patella, MD. Schedule an appointment as soon as possible for a visit in 2 weeks. (for hospital follow-up)  Specialty:  Family Medicine   Contact information:   [phone] [address] [place] [address] [phone]       Follow up with SETHI,PRAMOD, MD. Schedule an appointment as soon as possible for a visit in 10 days. (for hospital follow-up, TIA follow-up)    Specialties:  Neurology, Radiology   Contact information:   [address] [place] [address] [phone]       Time spent on Discharge: 40 mins  Signed:   RAI,RIPUDEEP M.D. [facility] 09/02/2013, 4:37 PM Pager: [phone]   **Disclaimer: This note was dictated with voice recognition software. Similar sounding words can inadvertently be transcribed and this note may contain transcription errors which may not have been corrected upon publication of note.**

## 2013-11-05 ENCOUNTER — Ambulatory Visit: Payer: Commercial Managed Care - PPO | Admitting: Obstetrics & Gynecology

## 2013-11-18 ENCOUNTER — Ambulatory Visit (INDEPENDENT_AMBULATORY_CARE_PROVIDER_SITE_OTHER): Payer: Commercial Managed Care - PPO | Admitting: Obstetrics & Gynecology

## 2013-11-18 ENCOUNTER — Encounter: Payer: Self-pay | Admitting: Obstetrics & Gynecology

## 2013-11-18 VITALS — BP 102/62 | HR 64 | Resp 16 | Ht 63.25 in | Wt 195.4 lb

## 2013-11-18 DIAGNOSIS — Z124 Encounter for screening for malignant neoplasm of cervix: Secondary | ICD-10-CM

## 2013-11-18 DIAGNOSIS — Z01419 Encounter for gynecological examination (general) (routine) without abnormal findings: Secondary | ICD-10-CM

## 2013-11-18 MED ORDER — HYDROCORTISONE ACETATE 25 MG RE SUPP: 25.0000 mg | Freq: Two times a day (BID) | RECTAL | Status: DC

## 2013-11-18 MED ORDER — HYDROCORTISONE 2.5 % RE CREA: 1.0000 "application " | TOPICAL_CREAM | Freq: Two times a day (BID) | RECTAL | Status: DC

## 2013-11-18 NOTE — Progress Notes (Signed)
63 y.o. G1P1 MarriedCaucasianF here for annual exam.  Has episode of visual field defect while eating at a restaurant.  Then had some aphasia during the meal.  On a clinical trial of new anti-platelet medication vs aspirin.  Will find out 11/30/13.  Just got her flu shot.  No vaginal bleeding.    Pt has two other questions for me.  Has worse odor on right side--axilla--with sweating.  Also when has hemorrhoid issues, she really has intense itching.  Not having any issues now but would like some suggestions.  Saw Dr. Nicholos Johnseade today.  Lipids done.  Hb A1c was 735.217.    5610 month old grandson, Kinnie Scalesitus, now in [place] with her son and duaghter-in-law  Patient's last menstrual period was 02/05/2006.          Sexually active: Yes.    The current method of family planning is post menopausal status.    Exercising: Yes.    walking, water walk, and weights Smoker:  no  Health Maintenance: Pap:  08/23/11 WNL/negative HR HPV History of abnormal Pap:  no MMG:  08/18/13 3D-normal Colonoscopy:  2006-repeat in 10 years BMD:   07/21/12-normal, 0.2/-0.2 TDaP:  5/10 Screening Labs: PCP, Hb today: PCP, Urine today: PCP   reports that she has never smoked. She has never used smokeless tobacco. She reports that she drinks about 1.8 ounces of alcohol per week. She reports that she does not use illicit drugs.  Past Medical History  Diagnosis Date  . Hypertension     hx of essential- on meds  . Hypercholesterolemia   . Exogenous obesity     She has had a problem with  . Elevated liver function tests     One of her studies are slightly elevated which may represent fatty liver from her Diabetes and her obesity or may represent an adverse effect from the Crestor.  . Infertility   . Leg cramps     in last 6 months  . TIA (transient ischemic attack) 09/01/2013    "that's what they think"  . Walking pneumonia ~ 1972  . Type II diabetes mellitus     mild  . Headache(784.0)     "daily to weekly lately" (09/02/2013)     Past Surgical History  Procedure Laterality Date  . Cardiovascular stress test  06-29-2002    EF is normal at 55%  . Ankle fracture surgery Left ~ 2007  . Anterior cervical decomp/discectomy fusion  2001  . Dilation and curettage of uterus    . Laparoscopy      multiple s/p infertility  . Pilonidal cyst excision    . Knee arthroscopy with meniscal repair Right 2/14  . Tonsillectomy  1960's  . Knee arthroscopy Left 1980's    Current Outpatient Prescriptions  Medication Sig Dispense Refill  . B Complex Vitamins (B COMPLEX PO) Take 1 tablet by mouth daily.       Marland Kitchen. BIOTIN PO Take 1 tablet by mouth daily.      Marland Kitchen. CALCIUM PO Take 1,000 mg by mouth daily.       . cetirizine (ZYRTEC) 10 MG tablet Take 10 mg by mouth daily.      . Cholecalciferol (VITAMIN D PO) Take 1 tablet by mouth daily.       Marland Kitchen. ibuprofen (ADVIL,MOTRIN) 200 MG tablet Take 400 mg by mouth every 6 (six) hours as needed for moderate pain.      Marland Kitchen. losartan (COZAAR) 50 MG tablet Take 50 mg by mouth  daily.      Marland Kitchen OVER THE COUNTER MEDICATION Apply 1 application topically daily as needed (for ear itching).      . potassium chloride SA (K-DUR,KLOR-CON) 20 MEQ tablet Take 20 mEq by mouth 2 (two) times daily.      . research study medication Take 100 mg by mouth daily.      . research study medication Take 90 mg by mouth 2 (two) times daily.      . rosuvastatin (CRESTOR) 5 MG tablet Take 5 mg by mouth every morning.      . Saxagliptin-Metformin (KOMBIGLYZE XR) 06-998 MG TB24 Take 1 tablet by mouth daily.       . traMADol (ULTRAM) 50 MG tablet Take 1 tablet (50 mg total) by mouth every 8 (eight) hours as needed.  30 tablet  0  . zolpidem (AMBIEN) 10 MG tablet Take 10 mg by mouth at bedtime.      . Multiple Vitamins-Minerals (AIRBORNE) CHEW Chew 3 each by mouth daily as needed (for cold).      . [DISCONTINUED] Saxagliptin HCl (ONGLYZA PO) Take 1,000 mg by mouth.         No current facility-administered medications for this visit.     Family History  Problem Relation Age of Onset  . Diabetes Father   . Hypertension Father   . Hypertension Paternal Grandmother   . Heart Problems Mother     aortic valve replacement  . CVA Father   . CVA Paternal Grandmother     ROS:  Pertinent items are noted in HPI.  Otherwise, a comprehensive ROS was negative.  Exam:   BP 102/62  Pulse 64  Resp 16  Ht 5' 3.25" (1.607 m)  Wt 195 lb 6.4 oz (88.633 kg)  BMI 34.32 kg/m2  LMP 02/05/2006  Weight change:  -10#   Height: 5' 3.25" (160.7 cm)  Ht Readings from Last 3 Encounters:  11/18/13 5' 3.25" (1.607 m)  10/16/12 5' 3.5" (1.613 m)  08/28/12 5\' 3"  (1.6 m)    General appearance: alert, cooperative and appears stated age Head: Normocephalic, without obvious abnormality, atraumatic Neck: no adenopathy, supple, symmetrical, trachea midline and thyroid normal to inspection and palpation Lungs: clear to auscultation bilaterally Breasts: normal appearance, no masses or tenderness Heart: regular rate and rhythm Abdomen: soft, non-tender; bowel sounds normal; no masses,  no organomegaly Extremities: extremities normal, atraumatic, no cyanosis or edema Skin: Skin color, texture, turgor normal. No rashes or lesions Lymph nodes: Cervical, supraclavicular, and axillary nodes normal. No abnormal inguinal nodes palpated Neurologic: Grossly normal   Pelvic: External genitalia:  no lesions              Urethra:  normal appearing urethra with no masses, tenderness or lesions              Bartholins and Skenes: normal                 Vagina: normal appearing vagina with normal color and discharge, no lesions              Cervix: no lesions              Pap taken: Yes.   Bimanual Exam:  Uterus:  normal size, contour, position, consistency, mobility, non-tender              Adnexa: normal adnexa and no mass, fullness, tenderness               Rectovaginal: Confirms  Anus:  normal sphincter tone, no lesions  A:  Well  Woman with normal exam  PMP, no HRT  DM, hypertension, elevated lipids  TIA with visual defect earlier this year Hemorrhoid  P: Mammogram yearly.  pap smear with neg HR HPV 7/13.  Pap today. Labs all with Dr. Nicholos Johnseade and Dr. Patty SermonsBrackbill.  Hydrocortisone suppositories and hydrocortisone 2.5% ointment up to twice daily as needed.  Rx to pharmacy to hold for pt when needs. return annually or prn  An After Visit Summary was printed and given to the patient.

## 2013-11-19 LAB — IPS PAP TEST WITH REFLEX TO HPV

## 2013-11-30 ENCOUNTER — Telehealth: Payer: Self-pay | Admitting: Neurology

## 2013-11-30 ENCOUNTER — Encounter (INDEPENDENT_AMBULATORY_CARE_PROVIDER_SITE_OTHER): Payer: Self-pay | Admitting: Neurology

## 2013-11-30 DIAGNOSIS — Z0289 Encounter for other administrative examinations: Secondary | ICD-10-CM

## 2013-11-30 NOTE — Telephone Encounter (Signed)
I spoke to the patient to reschedule her appointment from 03/09/13 at 9:00h to 03/09/13 at 8:45h.

## 2013-12-02 ENCOUNTER — Ambulatory Visit (INDEPENDENT_AMBULATORY_CARE_PROVIDER_SITE_OTHER): Payer: Commercial Managed Care - PPO

## 2013-12-02 ENCOUNTER — Ambulatory Visit (INDEPENDENT_AMBULATORY_CARE_PROVIDER_SITE_OTHER): Payer: Commercial Managed Care - PPO | Admitting: Podiatry

## 2013-12-02 ENCOUNTER — Encounter: Payer: Self-pay | Admitting: Podiatry

## 2013-12-02 DIAGNOSIS — M2042 Other hammer toe(s) (acquired), left foot: Secondary | ICD-10-CM

## 2013-12-02 DIAGNOSIS — M84374A Stress fracture, right foot, initial encounter for fracture: Secondary | ICD-10-CM

## 2013-12-02 DIAGNOSIS — G5762 Lesion of plantar nerve, left lower limb: Secondary | ICD-10-CM

## 2013-12-02 DIAGNOSIS — M2010 Hallux valgus (acquired), unspecified foot: Secondary | ICD-10-CM

## 2013-12-02 NOTE — Progress Notes (Signed)
   Subjective:    Patient ID: Christina Wong, female    DOB: 04/10/1950, 63 y.o.   MRN: [id number]  HPI Comments: "I have a lot of problems with these feet"  Patient c/o 1st MPJ bilateral and 2nd toe left for several years. Red and swells. Trouble with shoes. Thinks may have broke something forefoot right.      Review of Systems  Musculoskeletal: Positive for arthralgias and back pain.  Allergic/Immunologic: Positive for food allergies.       Objective:   Physical Exam        Assessment & Plan:

## 2013-12-02 NOTE — Progress Notes (Signed)
Subjective:     Patient ID: Christina Wong, female   DOB: 03/26/1950, 63 y.o.   MRN: [id number]  Foot Pain  Toe Pain    patient presents stating I have had a painful bunion on my left foot for a long time and have an elevation of my second toe left it's painful and numbness and shooting pains third toe left. Also states that for the last couple weeks she has developed pain on top of her right foot with inflammation and swelling within the midfoot.   Review of Systems  All other systems reviewed and are negative.      Objective:   Physical Exam  Nursing note and vitals reviewed. Constitutional: She is oriented to person, place, and time.  Cardiovascular: Intact distal pulses.   Musculoskeletal: Normal range of motion.  Neurological: She is oriented to person, place, and time.  Skin: Skin is warm.   neurovascular status found to be intact with muscle strength adequate and range of motion subtalar midtarsal joint within normal limits. Patient has a large hyperostosis medial aspect first metatarsal head left over right with deviation of the hallux against the second toe and elevated second toe with redness on top second toe left foot. Patient does have sharp pain with a palpable patent mass third interspace left when I squeeze the metatarsals together. 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 We discussed correction the left consisting of [place] bunionectomy digital fusion digit 2 left and neurectomy third interspace. She will either opt to have this done beginning of December or middle of January and I will see her back in 2 weeks. Dispensed air fracture walker to wear for the right  in order to completely immobilize and take weight off and reappoint to reevaluate in 2 weeks

## 2013-12-07 ENCOUNTER — Encounter: Payer: Self-pay | Admitting: Podiatry

## 2013-12-14 ENCOUNTER — Telehealth: Payer: Self-pay | Admitting: Neurology

## 2013-12-14 NOTE — Telephone Encounter (Signed)
I will discuss this with her  during appointment on 11/11/`5

## 2013-12-14 NOTE — Telephone Encounter (Signed)
Patient calling to ask some questions from Dr. Pearlean BrownieSethi, states that she spoke with the work in doctor about having a small TIA, patient also wants to discuss if it is safe for her to leave for a trip to visit a dying friend, states that this is the only chance that she will get to see her.

## 2013-12-14 NOTE — Telephone Encounter (Signed)
Spoke with patient, she states that she had another TIA episode, patient is questioning if she can be allowed to travel this Saturday, informed patient that a message would be sent to Dr Pearlean BrownieSethi to answer this question. Appointment was scheduled for 12/16/13 at 2 pm.

## 2013-12-16 ENCOUNTER — Encounter: Payer: Self-pay | Admitting: Podiatry

## 2013-12-16 ENCOUNTER — Ambulatory Visit (INDEPENDENT_AMBULATORY_CARE_PROVIDER_SITE_OTHER): Payer: Commercial Managed Care - PPO

## 2013-12-16 ENCOUNTER — Encounter: Payer: Self-pay | Admitting: Neurology

## 2013-12-16 ENCOUNTER — Ambulatory Visit (INDEPENDENT_AMBULATORY_CARE_PROVIDER_SITE_OTHER): Payer: Commercial Managed Care - PPO | Admitting: Podiatry

## 2013-12-16 ENCOUNTER — Ambulatory Visit (INDEPENDENT_AMBULATORY_CARE_PROVIDER_SITE_OTHER): Payer: Commercial Managed Care - PPO | Admitting: Neurology

## 2013-12-16 VITALS — BP 129/79 | HR 62 | Ht 64.25 in | Wt 196.2 lb

## 2013-12-16 VITALS — BP 111/65 | HR 77 | Resp 16

## 2013-12-16 DIAGNOSIS — M2010 Hallux valgus (acquired), unspecified foot: Secondary | ICD-10-CM

## 2013-12-16 DIAGNOSIS — M2042 Other hammer toe(s) (acquired), left foot: Secondary | ICD-10-CM

## 2013-12-16 DIAGNOSIS — M8430XD Stress fracture, unspecified site, subsequent encounter for fracture with routine healing: Secondary | ICD-10-CM

## 2013-12-16 DIAGNOSIS — G43109 Migraine with aura, not intractable, without status migrainosus: Secondary | ICD-10-CM

## 2013-12-16 NOTE — Patient Instructions (Signed)
I had a long discussion with the patient with regards to her episode of transient difficulty with vision, texting and followed by headache and feeling tired possibly sounds like,  a  Complicated migraine episode. I do not recommend any further neurovascular testing at the present time since she recently had an extensive workup 3 months ago. Continue Plavix for stroke prevention and strict control of hypertension with blood pressure goal below 130/90 and lipids with LDL cholesterol goal below 72 mg percent. Return for follow-up in 6 months or call earlier if necessary

## 2013-12-16 NOTE — Progress Notes (Signed)
Subjective:     Patient ID: Christina Wong, female   DOB: 07/14/1950, 63 y.o.   MRN: [id number]  HPIpatient presents stating my right foot is feeling quite a bit better and I'm ready to have surgery on my left foot but I need to wait until the end of January due to my schedule   Review of Systems     Objective:   Physical Exam Neurovascular status is intact no change in health history with diminished discomfort on the dorsum of the right foot with edema still noted around the second metatarsal and continuing to have structural bunion deformity left elevated second toe left and pain between the third and fourth toes with shooting discomfort into the adjacent digits    Assessment:     Stress fracture improving right with structural 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 At this point we scheduled her for surgery the end of January for [facility] osteotomy digital fusion digit 2 left and neurectomy third interspace. For the right foot I went ahead and recommended reducing the boot and hopefully being out of it entirely within about 3 weeks. Reappoint one week before surgery on the left foot

## 2013-12-16 NOTE — Patient Instructions (Signed)
Pre-Operative Instructions  Congratulations, you have decided to take an important step to improving your quality of life.  You can be assured that the doctors of [facility] will be with you every step of the way.  1. Plan to be at the surgery center/hospital at least 1 (one) hour prior to your scheduled time unless otherwise directed by the surgical center/hospital staff.  You must have a responsible adult accompany you, remain during the surgery and drive you home.  Make sure you have directions to the surgical center/hospital and know how to get there on time. 2. For hospital based surgery you will need to obtain a history and physical form from your family physician within 1 month prior to the date of surgery- we will give you a form for you primary physician.  3. We make every effort to accommodate the date you request for surgery.  There are however, times where surgery dates or times have to be moved.  We will contact you as soon as possible if a change in schedule is required.   4. No Aspirin/Ibuprofen for one week before surgery.  If you are on aspirin, any non-steroidal anti-inflammatory medications (Mobic, Aleve, Ibuprofen) you should stop taking it 7 days prior to your surgery.  You make take Tylenol  For pain prior to surgery.  5. Medications- If you are taking daily heart and blood pressure medications, seizure, reflux, allergy, asthma, anxiety, pain or diabetes medications, make sure the surgery center/hospital is aware before the day of surgery so they may notify you which medications to take or avoid the day of surgery. 6. No food or drink after midnight the night before surgery unless directed otherwise by surgical center/hospital staff. 7. No alcoholic beverages 24 hours prior to surgery.  No smoking 24 hours prior to or 24 hours after surgery. 8. Wear loose pants or shorts- loose enough to fit over bandages, boots, and casts. 9. No slip on shoes, sneakers are best. 10. Bring  your boot with you to the surgery center/hospital.  Also bring crutches or a walker if your physician has prescribed it for you.  If you do not have this equipment, it will be provided for you after surgery. 11. If you have not been contracted by the surgery center/hospital by the day before your surgery, call to confirm the date and time of your surgery. 12. Leave-time from work may vary depending on the type of surgery you have.  Appropriate arrangements should be made prior to surgery with your employer. 13. Prescriptions will be provided immediately following surgery by your doctor.  Have these filled as soon as possible after surgery and take the medication as directed. 14. Remove nail polish on the operative foot. 15. Wash the night before surgery.  The night before surgery wash the foot and leg well with the antibacterial soap provided and water paying special attention to beneath the toenails and in between the toes.  Rinse thoroughly with water and dry well with a towel.  Perform this wash unless told not to do so by your physician.  Enclosed: 1 Ice pack (please put in freezer the night before surgery)   1 Hibiclens skin cleaner   Pre-op Instructions  If you have any questions regarding the instructions, do not hesitate to call our office.  [place]: [address] [place], [place] [address] [phone]  [place]: [address] [place], [place] [address] [phone]  [place]: [address]  [place], [place] [address] [phone]  Dr. Richard   Tuchman DPM, Dr. Norman Regal DPM Dr. Richard Sikora DPM, Dr. M. Todd Hyatt DPM, Dr. Kathryn Egerton DPM 

## 2013-12-16 NOTE — Progress Notes (Signed)
[facility] [address] [place]. [place] [id number] [phone]       OFFICE FOLLOW-UP NOTE  [name] Date of Birth:  04/28/1950 Medical Record Number:  [id number]   HPI: [id number] year Caucasian lady seen today for the first office follow-up visit following hospital admission for TIA on 09/01/2013. She presented with transient difficulty seeing started while she was at dinner 09/01/2013 at 7p. She states that she was looking at words and could only see half of the word, noting she could not see the right half. She then noticed that she was having trouble speaking. This lasted for somewhere around 10-20 minutes . Of note, she had been having headaches for the past few months the been getting more frequent, over the past week has had a headache everyday. It is left periorbital, not associated with photophobia or phonophobia, not associated with nausea and vomiting, non-throbbing in character. She denies jaw claudication. Patient was not administered TPA secondary to resolved symptoms. She was admitted for further evaluation and treatment.She qualified for an enrolled in the SOCRATES trial ( aspirin versus brillinta ).she has completed participation in the trial and is currently on Plavix 75 mg daily. She states her blood pressure cholesterol evident good control. She had another transient episode on 12/10/13 when she was not feeling well due to a bad cold She noticed that she had trouble texting and had trouble seeing images on the right side. She did not have any trouble speaking this time but did notice a mild headache. She felt tired and out of it for the rest of the day. She does have a remote history of migraine headaches and you would get vision or aspirin this was 35 years ago. She was concerned whether this could be a TIA and has requested to be seen urgently. She does have a trip planned to [place] and was concerned whether she could still travel.  ROS:   14 system  review of systems is positive for headache, vision difficulty, tiredness and all other systems negative  PMH:  Past Medical History  Diagnosis Date  . Hypertension     hx of essential- on meds  . Hypercholesterolemia   . Exogenous obesity     She has had a problem with  . Elevated liver function tests     One of her studies are slightly elevated which may represent fatty liver from her Diabetes and her obesity or may represent an adverse effect from the Crestor.  . Infertility   . Leg cramps     in last 6 months  . TIA (transient ischemic attack) 09/01/2013    "that's what they think"  . Walking pneumonia ~ 1972  . Type II diabetes mellitus     mild  . Headache(784.0)     "daily to weekly lately" (09/02/2013)    Social History:  History   Social History  . Marital Status: Married    Spouse Name: N/A    Number of Children: 1  . Years of Education: BA   Occupational History  . Not on file.   Social History Main Topics  . Smoking status: Never Smoker   . Smokeless tobacco: Never Used  . Alcohol Use: 1.8 oz/week    3 Glasses of wine per week  . Drug Use: No  . Sexual Activity:    Partners: Male   Other Topics Concern  . Not on file   Social History Narrative   Patient is married  with 1 biological child, and 1 adopted child.   Patient is right handed.   Patient has her Bachelor's degree.   Patient drinks 1 cup daily.    Medications:   Current Outpatient Prescriptions on File Prior to Visit  Medication Sig Dispense Refill  . B Complex Vitamins (B COMPLEX PO) Take 1 tablet by mouth daily.     Marland Kitchen. BIOTIN PO Take 1 tablet by mouth daily.    Marland Kitchen. CALCIUM PO Take 1,000 mg by mouth daily.     . cetirizine (ZYRTEC) 10 MG tablet Take 10 mg by mouth daily.    . Cholecalciferol (VITAMIN D PO) Take 1 tablet by mouth daily.     Marland Kitchen. ibuprofen (ADVIL,MOTRIN) 200 MG tablet Take 400 mg by mouth every 6 (six) hours as needed for moderate pain.    Marland Kitchen. losartan (COZAAR) 50 MG tablet Take  50 mg by mouth daily.    Marland Kitchen. OVER THE COUNTER MEDICATION Apply 1 application topically daily as needed (for ear itching).    . potassium chloride SA (K-DUR,KLOR-CON) 20 MEQ tablet Take 20 mEq by mouth 2 (two) times daily.    . rosuvastatin (CRESTOR) 5 MG tablet Take 5 mg by mouth every morning.    . Saxagliptin-Metformin (KOMBIGLYZE XR) 06-998 MG TB24 Take 1 tablet by mouth daily.     . traMADol (ULTRAM) 50 MG tablet Take 1 tablet (50 mg total) by mouth every 8 (eight) hours as needed. 30 tablet 0  . zolpidem (AMBIEN) 10 MG tablet Take 10 mg by mouth at bedtime. TAKING 1/2 TABLET AT BEDTIME    . hydrocortisone (ANUSOL-HC) 2.5 % rectal cream Place 1 application rectally 2 (two) times daily. 30 g 1  . hydrocortisone (ANUSOL-HC) 25 MG suppository Place 1 suppository (25 mg total) rectally 2 (two) times daily. 15 suppository 1  . [DISCONTINUED] Saxagliptin HCl (ONGLYZA PO) Take 1,000 mg by mouth.       No current facility-administered medications on file prior to visit.    Allergies:   Allergies  Allergen Reactions  . Lipitor [Atorvastatin] Other (See Comments)    Leg cramps  . Onion Nausea And Vomiting  . Bactrim [Sulfamethoxazole-Trimethoprim] Other (See Comments)    Muscle issues  . Sulfa Antibiotics Other (See Comments)    unknown    Physical Exam General: well developed, well nourished middle aged Caucasian lady, seated, in no evident distress Head: head normocephalic and atraumatic.  Neck: supple with no carotid or supraclavicular bruits Cardiovascular: regular rate and rhythm, no murmurs Musculoskeletal: no deformity Skin:  no rash/petichiae Vascular:  Normal pulses all extremities Filed Vitals:   12/16/13 1412  BP: 129/79  Pulse: 62   Neurologic Exam Mental Status: Awake and fully alert. Oriented to place and time. Recent and remote memory intact. Attention span, concentration and fund of knowledge appropriate. Mood and affect appropriate.  Cranial Nerves: Fundoscopic exam  reveals sharp disc margins. Pupils equal, briskly reactive to light. Extraocular movements full without nystagmus. Visual fields full to confrontation. Hearing intact. Facial sensation intact. Face, tongue, palate moves normally and symmetrically.  Motor: Normal bulk and tone. Normal strength in all tested extremity muscles. Sensory.: intact to touch ,pinprick .position and vibratory sensation.  Coordination: Rapid alternating movements normal in all extremities. Finger-to-nose and heel-to-shin performed accurately bilaterally. Gait and Station: Arises from chair without difficulty. Stance is normal. Gait demonstrates normal stride length and balance . Able to heel, toe and tandem walk without difficulty.  Reflexes: 1+ and symmetric. Toes downgoing.  NIHSS 0 Modified Rankin  1   ASSESSMENT: 70 year Caucasian lady  With transient episode of difficulty speaking and right sided vision difficulties in July 2015 probably left brain TIA With vascular risk factors of diabetes, hypertension and hyperlipidemia.Patient has completed participation in the SOCRATES trial Remote history of migraine headaches which had been quiet for years.another transient episode last week of vision difficulty while texting followed by headache and tired feeling possibly complicated migraine episode    PLAN: I had a long discussion with the patient with regards to her episode of transient difficulty with vision, texting and followed by headache and feeling tired possibly sounds like,  a  Complicated migraine episode. I do not recommend any further neurovascular testing at the present time since she recently had an extensive workup 3 months ago. Continue Plavix for stroke prevention and strict control of hypertension with blood pressure goal below 130/90 and lipids with LDL cholesterol goal below 72 mg percent. Return for follow-up in 6 months or call earlier if necessary    Note: This document was prepared with digital  dictation and possible smart phrase technology. Any transcriptional errors that result from this process are unintentional

## 2014-02-01 ENCOUNTER — Encounter: Payer: Self-pay | Admitting: Podiatry

## 2014-02-01 ENCOUNTER — Ambulatory Visit (INDEPENDENT_AMBULATORY_CARE_PROVIDER_SITE_OTHER): Payer: Commercial Managed Care - PPO | Admitting: Podiatry

## 2014-02-01 VITALS — BP 142/79 | HR 72 | Resp 16

## 2014-02-01 DIAGNOSIS — M2042 Other hammer toe(s) (acquired), left foot: Secondary | ICD-10-CM

## 2014-02-01 DIAGNOSIS — G5762 Lesion of plantar nerve, left lower limb: Secondary | ICD-10-CM

## 2014-02-01 DIAGNOSIS — M2010 Hallux valgus (acquired), unspecified foot: Secondary | ICD-10-CM

## 2014-02-01 NOTE — Progress Notes (Signed)
Subjective:     Patient ID: Christina Wong, female   DOB: 02/22/1950, 63 y.o.   MRN: [id number]  HPI patient states she wants to discuss correction of her left foot which is sore and makes wearing shoe gear difficult   Review of Systems     Objective:   Physical Exam Neurovascular status intact with muscle strength adequate and range of motion within normal limits. Patient's noted to have hyperostosis medial aspect first metatarsal left that red elevated second toe left with pain on top and pain in the third interspace left with shooting discomforts. 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 She's tried wider shoes she's tried softer leather without relief of symptoms and wants to review consent form for correction. I allowed her to read consent form going over the structural procedures to be performed and reviewed with her all possible complications associated with surgery and the fact that there is no long-term guarantees that this will solve her problems and the told recovery period takes proximally 6 months. Patient wants surgery understanding risk and signs consent form and is given all preoperative instructions and also will have a block prior to surgery which I explained to the patient and she is encouraged to call she should have any other questions prior to procedure

## 2014-02-01 NOTE — Patient Instructions (Signed)
Pre-Operative Instructions  Congratulations, you have decided to take an important step to improving your quality of life.  You can be assured that the doctors of [facility] will be with you every step of the way.  1. Plan to be at the surgery center/hospital at least 1 (one) hour prior to your scheduled time unless otherwise directed by the surgical center/hospital staff.  You must have a responsible adult accompany you, remain during the surgery and drive you home.  Make sure you have directions to the surgical center/hospital and know how to get there on time. 2. For hospital based surgery you will need to obtain a history and physical form from your family physician within 1 month prior to the date of surgery- we will give you a form for you primary physician.  3. We make every effort to accommodate the date you request for surgery.  There are however, times where surgery dates or times have to be moved.  We will contact you as soon as possible if a change in schedule is required.   4. No Aspirin/Ibuprofen for one week before surgery.  If you are on aspirin, any non-steroidal anti-inflammatory medications (Mobic, Aleve, Ibuprofen) you should stop taking it 7 days prior to your surgery.  You make take Tylenol  For pain prior to surgery.  5. Medications- If you are taking daily heart and blood pressure medications, seizure, reflux, allergy, asthma, anxiety, pain or diabetes medications, make sure the surgery center/hospital is aware before the day of surgery so they may notify you which medications to take or avoid the day of surgery. 6. No food or drink after midnight the night before surgery unless directed otherwise by surgical center/hospital staff. 7. No alcoholic beverages 24 hours prior to surgery.  No smoking 24 hours prior to or 24 hours after surgery. 8. Wear loose pants or shorts- loose enough to fit over bandages, boots, and casts. 9. No slip on shoes, sneakers are best. 10. Bring  your boot with you to the surgery center/hospital.  Also bring crutches or a walker if your physician has prescribed it for you.  If you do not have this equipment, it will be provided for you after surgery. 11. If you have not been contracted by the surgery center/hospital by the day before your surgery, call to confirm the date and time of your surgery. 12. Leave-time from work may vary depending on the type of surgery you have.  Appropriate arrangements should be made prior to surgery with your employer. 13. Prescriptions will be provided immediately following surgery by your doctor.  Have these filled as soon as possible after surgery and take the medication as directed. 14. Remove nail polish on the operative foot. 15. Wash the night before surgery.  The night before surgery wash the foot and leg well with the antibacterial soap provided and water paying special attention to beneath the toenails and in between the toes.  Rinse thoroughly with water and dry well with a towel.  Perform this wash unless told not to do so by your physician.  Enclosed: 1 Ice pack (please put in freezer the night before surgery)   1 Hibiclens skin cleaner   Pre-op Instructions  If you have any questions regarding the instructions, do not hesitate to call our office.  [place]: [address] [place], [place] [address] [phone]  [place]: [address] [place], [place] [address] [phone]  [place]: [address]  [place], [place] [address] [phone]  Dr. Richard   Tuchman DPM, Dr. Norman Regal DPM Dr. Richard Sikora DPM, Dr. M. Todd Hyatt DPM, Dr. Kathryn Egerton DPM 

## 2014-02-05 HISTORY — PX: FOOT SURGERY: SHX648

## 2014-02-23 ENCOUNTER — Telehealth: Payer: Self-pay | Admitting: *Deleted

## 2014-02-23 NOTE — Telephone Encounter (Signed)
entereed in error

## 2014-02-23 NOTE — Telephone Encounter (Addendum)
Pt states she is scheduled for hammer toe surgery with Dr. Charlsie Merlesegal 1 week from today and would like to know if she should stop her Plavix.  Pt states she is also being treated for right foot fracture and would like a knee scooter.  I spoke with Lottie RaterAshley Prevette, Dr. Beverlee Nimsegal's assistant, she stated Dr. Charlsie Merlesegal doesn't have a strict blood thinner rule and usually refers to the pt's cardiologist, this I informed the pt.  I also asked the pt if she knew she would be able to bear weight after the surgery for 5 minute/ hour the 1st week post-op, and she may find a walker more beneficial.  Pt agreed and [facility]

## 2014-02-24 ENCOUNTER — Telehealth: Payer: Self-pay | Admitting: Neurology

## 2014-02-24 NOTE — Telephone Encounter (Signed)
Okay to hold Plavix for 3-5 days prior to the procedure and to resume it after it with a small but acceptable risk of peri-procedure TIA/stroke.

## 2014-02-24 NOTE — Telephone Encounter (Signed)
Patient having surgery 03/02/14 requesting to stop Plavix 75 mg, please advise

## 2014-02-24 NOTE — Telephone Encounter (Signed)
Patient scheduled for foot surgery on 03/02/14.  Questioning if she needs to discontinue Rx clopidogrel (PLAVIX) 75 MG tablet.  Please call and advise.

## 2014-03-02 ENCOUNTER — Encounter: Payer: Self-pay | Admitting: Podiatry

## 2014-03-02 DIAGNOSIS — G5760 Lesion of plantar nerve, unspecified lower limb: Secondary | ICD-10-CM

## 2014-03-02 DIAGNOSIS — M2042 Other hammer toe(s) (acquired), left foot: Secondary | ICD-10-CM

## 2014-03-02 DIAGNOSIS — M2012 Hallux valgus (acquired), left foot: Secondary | ICD-10-CM

## 2014-03-03 NOTE — Telephone Encounter (Signed)
Called patient and LVM with Dr Marlis EdelsonSethi's message

## 2014-03-04 NOTE — Progress Notes (Signed)
DOS 03/02/2014 left austin bunionectomy, left 3rd interspace neurectomy, and left 2nd hammer toe repair with pin performed by Dr. Charlsie Merlesegal.

## 2014-03-08 ENCOUNTER — Ambulatory Visit (INDEPENDENT_AMBULATORY_CARE_PROVIDER_SITE_OTHER): Payer: Commercial Managed Care - PPO | Admitting: Podiatry

## 2014-03-08 ENCOUNTER — Ambulatory Visit: Payer: Commercial Managed Care - PPO

## 2014-03-08 ENCOUNTER — Ambulatory Visit (INDEPENDENT_AMBULATORY_CARE_PROVIDER_SITE_OTHER): Payer: Commercial Managed Care - PPO

## 2014-03-08 VITALS — BP 126/68 | HR 72 | Temp 98.6°F | Resp 16

## 2014-03-08 DIAGNOSIS — Z9889 Other specified postprocedural states: Secondary | ICD-10-CM

## 2014-03-08 DIAGNOSIS — M8430XD Stress fracture, unspecified site, subsequent encounter for fracture with routine healing: Secondary | ICD-10-CM

## 2014-03-08 DIAGNOSIS — M2010 Hallux valgus (acquired), unspecified foot: Secondary | ICD-10-CM

## 2014-03-08 MED ORDER — TRAMADOL HCL 50 MG PO TABS: 50.0000 mg | ORAL_TABLET | Freq: Three times a day (TID) | ORAL | Status: DC

## 2014-03-08 MED ORDER — HYDROCODONE-ACETAMINOPHEN 10-325 MG PO TABS: 1.0000 | ORAL_TABLET | Freq: Three times a day (TID) | ORAL | Status: DC | PRN

## 2014-03-09 ENCOUNTER — Ambulatory Visit (INDEPENDENT_AMBULATORY_CARE_PROVIDER_SITE_OTHER): Payer: Commercial Managed Care - PPO | Admitting: Neurology

## 2014-03-09 ENCOUNTER — Encounter: Payer: Commercial Managed Care - PPO | Admitting: Podiatry

## 2014-03-09 ENCOUNTER — Encounter: Payer: Self-pay | Admitting: Neurology

## 2014-03-09 VITALS — BP 100/65 | HR 78 | Ht 61.25 in | Wt 196.0 lb

## 2014-03-09 DIAGNOSIS — G43109 Migraine with aura, not intractable, without status migrainosus: Secondary | ICD-10-CM | POA: Insufficient documentation

## 2014-03-09 NOTE — Progress Notes (Signed)
[facility] [address] [place]. [place] [address] [phone]       OFFICE FOLLOW-UP NOTE  [name] Date of Birth:  24-Sep-1950 Medical Record Number:  [id number]   HPI: 41 year Caucasian lady seen today for the first office follow-up visit following hospital admission for TIA on 09/01/2013. She presented with transient difficulty seeing started while she was at dinner 09/01/2013 at 7p. She states that she was looking at words and could only see half of the word, noting she could not see the right half. She then noticed that she was having trouble speaking. This lasted for somewhere around 10-20 minutes . Of note, she had been having headaches for the past few months the been getting more frequent, over the past week has had a headache everyday. It is left periorbital, not associated with photophobia or phonophobia, not associated with nausea and vomiting, non-throbbing in character. She denies jaw claudication. Patient was not administered TPA secondary to resolved symptoms. She was admitted for further evaluation and treatment.She qualified for an enrolled in the SOCRATES trial ( aspirin versus brillinta ).she has completed participation in the trial and is currently on Plavix 75 mg daily. She states her blood pressure cholesterol evident good control. She had another transient episode on 12/10/13 when she was not feeling well due to a bad cold She noticed that she had trouble texting and had trouble seeing images on the right side. She did not have any trouble speaking this time but did notice a mild headache. She felt tired and out of it for the rest of the day. She does have a remote history of migraine headaches and you would get vision or aspirin this was 35 years ago. She was concerned whether this could be a TIA and has requested to be seen urgently. She does have a trip planned to [place] and was concerned whether she could still travel. Update 03/09/2014 : She  returns for follow-up after last visit 3 months ago. She has not had any further episodes of vision difficulties, migraine-like headaches or TIAs. Patient states her blood pressure is quite well controlled and it is 100/65 today. She is on Crestor which she is tolerating well without side effects and last lipid profile was quite good. Her last hemoglobin A1c was optimal at 5.7 but she does not check fasting sugars regularly. She is is tolerating Plavix without bleeding, bruising or other side effects. She has no new complaints today. ROS:   14 system review of systems is positive for headache, vision difficulty, light sensitivity and all other systems negative  PMH:  Past Medical History  Diagnosis Date  . Hypertension     hx of essential- on meds  . Hypercholesterolemia   . Exogenous obesity     She has had a problem with  . Elevated liver function tests     One of her studies are slightly elevated which may represent fatty liver from her Diabetes and her obesity or may represent an adverse effect from the Crestor.  . Infertility   . Leg cramps     in last 6 months  . TIA (transient ischemic attack) 09/01/2013    "that's what they think"  . Walking pneumonia ~ 1972  . Type II diabetes mellitus     mild  . Headache(784.0)     "daily to weekly lately" (09/02/2013)    Social History:  History   Social History  . Marital Status: Married  Spouse Name: N/A    Number of Children: 1  . Years of Education: BA   Occupational History  . Not on file.   Social History Main Topics  . Smoking status: Never Smoker   . Smokeless tobacco: Never Used  . Alcohol Use: 1.8 oz/week    3 Glasses of wine per week  . Drug Use: No  . Sexual Activity:    Partners: Male   Other Topics Concern  . Not on file   Social History Narrative   Patient is married with 1 biological child, and 1 adopted child.   Patient is right handed.   Patient has her Bachelor's degree.   Patient drinks 1 cup  daily.    Medications:   Current Outpatient Prescriptions on File Prior to Visit  Medication Sig Dispense Refill  . B Complex Vitamins (B COMPLEX PO) Take 1 tablet by mouth daily.     Marland Kitchen. BIOTIN PO Take 1 tablet by mouth daily.    Marland Kitchen. CALCIUM PO Take 1,000 mg by mouth daily.     . cetirizine (ZYRTEC) 10 MG tablet Take 10 mg by mouth daily.    . Cholecalciferol (VITAMIN D PO) Take 1 tablet by mouth daily.     . clopidogrel (PLAVIX) 75 MG tablet Take 75 mg by mouth daily.    Marland Kitchen. HYDROcodone-acetaminophen (NORCO) 10-325 MG per tablet Take 1 tablet by mouth every 8 (eight) hours as needed. 30 tablet 0  . ibuprofen (ADVIL,MOTRIN) 200 MG tablet Take 400 mg by mouth every 6 (six) hours as needed for moderate pain.    Marland Kitchen. losartan (COZAAR) 50 MG tablet Take 50 mg by mouth daily.    Marland Kitchen. OVER THE COUNTER MEDICATION Apply 1 application topically daily as needed (for ear itching).    . potassium chloride SA (K-DUR,KLOR-CON) 20 MEQ tablet Take 20 mEq by mouth 2 (two) times daily.    . rosuvastatin (CRESTOR) 5 MG tablet Take 5 mg by mouth every morning.    . Saxagliptin-Metformin (KOMBIGLYZE XR) 06-998 MG TB24 Take 1 tablet by mouth daily.     . traMADol (ULTRAM) 50 MG tablet Take 1 tablet (50 mg total) by mouth every 8 (eight) hours as needed. 30 tablet 0  . traMADol (ULTRAM) 50 MG tablet Take 1 tablet (50 mg total) by mouth 3 (three) times daily. 90 tablet 2  . zolpidem (AMBIEN) 10 MG tablet Take 10 mg by mouth at bedtime. TAKING 1/2 TABLET AT BEDTIME    . meperidine (DEMEROL) 50 MG tablet Take 50 mg by mouth every 4 (four) hours as needed for severe pain.    . [DISCONTINUED] Saxagliptin HCl (ONGLYZA PO) Take 1,000 mg by mouth.       No current facility-administered medications on file prior to visit.    Allergies:   Allergies  Allergen Reactions  . Lipitor [Atorvastatin] Other (See Comments)    Leg cramps  . Onion Nausea And Vomiting  . Bactrim [Sulfamethoxazole-Trimethoprim] Other (See Comments)     Muscle issues  . Sulfa Antibiotics Other (See Comments)    unknown    Physical Exam General: well developed, well nourished middle aged Caucasian lady, seated, in no evident distress Head: head normocephalic and atraumatic.  Neck: supple with no carotid or supraclavicular bruits Cardiovascular: regular rate and rhythm, no murmurs Musculoskeletal: no deformity.wearing left foot boot Skin:  no rash/petichiae Vascular:  Normal pulses all extremities Filed Vitals:   03/09/14 0921  BP: 100/65  Pulse: 78   Neurologic Exam  Mental Status: Awake and fully alert. Oriented to place and time. Recent and remote memory intact. Attention span, concentration and fund of knowledge appropriate. Mood and affect appropriate.  Cranial Nerves: Fundoscopic exam not done   Pupils equal, briskly reactive to light. Extraocular movements full without nystagmus. Visual fields full to confrontation. Hearing intact. Facial sensation intact. Face, tongue, palate moves normally and symmetrically.  Motor: Normal bulk and tone. Normal strength in all tested extremity muscles. Sensory.: intact to touch ,pinprick .position and vibratory sensation.  Coordination: Rapid alternating movements normal in all extremities. Finger-to-nose and heel-to-shin performed accurately bilaterally. Gait and Station: Arises from chair without difficulty. Stance is normal. Gait demonstrates normal stride length and balance . Able to heel, toe and tandem walk without difficulty.  Reflexes: 1+ and symmetric. Toes downgoing.      ASSESSMENT: 52 year Caucasian lady  With transient episode of difficulty speaking and right sided vision difficulties in July 2015 probably left brain TIA With vascular risk factors of diabetes, hypertension and hyperlipidemia.Patient has completed participation in the SOCRATES trial Remote history of migraine headaches   possibly complicated migraine episodes    PLAN: I had a long d/w patient & husband about her  prior TIA as well as migraine, risk for recurrent stroke/TIAs, personally independently reviewed imaging studies where applicable and   answered questions.Continue clopidogrel 75 mg orally every day  for secondary stroke prevention and maintain strict control of hypertension with blood pressure goal below 130/90, diabetes with hemoglobin A1c goal below 6.5% and lipids with LDL cholesterol goal below 100 mg/dL. I also advised her to maintain a healthy lifestyle, eat healthy and exercise regularly and maintain ideal body weight Followup in the future with me in 6 months or call earlier if necessary.    Note: This document was prepared with digital dictation and possible smart phrase technology. Any transcriptional errors that result from this process are unintentional

## 2014-03-09 NOTE — Patient Instructions (Signed)
I had a long d/w patient & husband about her prior TIA as well as migraine, risk for recurrent stroke/TIAs, personally independently reviewed imaging studies where applicable and   answered questions.Continue clopidogrel 75 mg orally every day  for secondary stroke prevention and maintain strict control of hypertension with blood pressure goal below 130/90, diabetes with hemoglobin A1c goal below 6.5% and lipids with LDL cholesterol goal below 100 mg/dL. I also advised her to maintain a healthy lifestyle, eat healthy and exercise regularly and maintain ideal body weight Followup in the future with me in 6 months or call earlier if necessary.

## 2014-03-10 NOTE — Progress Notes (Signed)
Subjective:     Patient ID: Christina Wong, female   DOB: 12/07/1950, 64 y.o.   MRN: [id number]  HPI patient states I'm having some pain in my foot left and also swelling and also my right foot has been bothering me and I'm worried I have another stress fracture   Review of Systems     Objective:   Physical Exam 1 week post surgical correction of the left foot with moderate edema in the dorsum of the right foot which may be due to stress fracture. Left foot the wound edges are well coapted there is mild edema noted range of motion of the first MPJ is good and the pin in the second toe is in good alignment    Assessment:     Doing well structural correction of the left foot with possible stress fracture right with pain on the left foot which should ease with bandage change and continued elevation    Plan:     H&P and x-rays reviewed. Sterile dressing reapplied to the left foot and instructed on reduced activity and also noted a negative Homans sign at this time area patient will continue with oral pain medication and was given a prescription for hydrocodone and tramadol and will continue with immobilization. Reappoint 2 weeks earlier if symptoms continue to be significant

## 2014-03-22 ENCOUNTER — Ambulatory Visit (INDEPENDENT_AMBULATORY_CARE_PROVIDER_SITE_OTHER): Payer: Commercial Managed Care - PPO | Admitting: Podiatry

## 2014-03-22 ENCOUNTER — Ambulatory Visit (INDEPENDENT_AMBULATORY_CARE_PROVIDER_SITE_OTHER): Payer: Commercial Managed Care - PPO

## 2014-03-22 VITALS — BP 138/84 | HR 68 | Resp 16

## 2014-03-22 DIAGNOSIS — Z9889 Other specified postprocedural states: Secondary | ICD-10-CM

## 2014-03-22 DIAGNOSIS — M2042 Other hammer toe(s) (acquired), left foot: Secondary | ICD-10-CM

## 2014-03-22 DIAGNOSIS — M2010 Hallux valgus (acquired), unspecified foot: Secondary | ICD-10-CM

## 2014-03-23 ENCOUNTER — Telehealth: Payer: Self-pay | Admitting: *Deleted

## 2014-03-23 NOTE — Telephone Encounter (Addendum)
Pt states the band and brace that holds her 2nd toe down is too painful to wear, and tight.  Pt states she wore the brace to bed last night and did not sleep. I told pt to come to the office and I would show her how to gauze wrap the toe and I would ask Dr. Charlsie Merlesegal about the brace and advise her tomorrow.  Pt presented for instruction in gauze taping of the 2nd toe left foot.  I instructed pt to wear the brace during the day and to gauze tape the left 2nd toe at night until further instructions from Dr. Charlsie Merlesegal.  Pt agreed.

## 2014-03-23 NOTE — Progress Notes (Signed)
Subjective:     Patient ID: Christina ChristmasSusan Y Wong, female   DOB: 09/25/1950, 64 y.o.   MRN: [id number]  HPI patient states I'm doing much better with some swelling still noted   Review of Systems     Objective:   Physical Exam Neurovascular status intact negative Homans sign noted with well-healing surgical sites left foot with pin in place second toe    Assessment:     Improvement with swelling still noted of the forefoot left    Plan:     Precautionary x-ray reviewed with stitches removed and sterile dressing reapplied and did apply a ankle compression brace today with a digital stretching device for the second toe. Reappoint 4 weeks earlier if necessary

## 2014-03-24 ENCOUNTER — Encounter: Payer: Self-pay | Admitting: Podiatry

## 2014-03-29 ENCOUNTER — Telehealth: Payer: Self-pay | Admitting: *Deleted

## 2014-03-29 NOTE — Telephone Encounter (Signed)
Pt states she is scheduled to have her pin taken out on 04/05/2014, but is scheduled to travel.  I told pt the pin can come out at 4-6 weeks.  Pt states she may keep the appt.

## 2014-04-05 ENCOUNTER — Other Ambulatory Visit: Payer: Commercial Managed Care - PPO

## 2014-04-12 ENCOUNTER — Ambulatory Visit (INDEPENDENT_AMBULATORY_CARE_PROVIDER_SITE_OTHER): Payer: Commercial Managed Care - PPO | Admitting: Podiatry

## 2014-04-12 ENCOUNTER — Ambulatory Visit: Payer: Self-pay

## 2014-04-12 DIAGNOSIS — Z9889 Other specified postprocedural states: Secondary | ICD-10-CM

## 2014-04-12 DIAGNOSIS — M2010 Hallux valgus (acquired), unspecified foot: Secondary | ICD-10-CM

## 2014-04-13 NOTE — Progress Notes (Signed)
Subjective:     Patient ID: Christina Wong, female   DOB: 04/11/1950, 64 y.o.   MRN: [id number]  HPI patient presents stating that she is doing well with her left foot but it still can become sore when she walks a lot and it still swells at times   Review of Systems     Objective:   Physical Exam Neurovascular status intact with muscle strength adequate and negative Homans sign noted. Wound edges are healing very well and the left foot and alignment is good of the big toe second toe with pin in place second toe and good alignment noted of the second toe in conjunction to the big toe    Assessment:     Doing well postoperatively forefoot surgery left    Plan:     Pin removed from second toe sterile dressing applied and x-rays reviewed with patient. Advised on the importance of continued elevation and compression and dispensed a BioSkin type over the ankle device in order to provide for compression reduced ankle motion and applied digital splint to lower the second toe. Patient will be seen back for us to recheck again in 4 weeks or earlier if necessary

## 2014-04-23 ENCOUNTER — Encounter: Payer: Self-pay | Admitting: Podiatry

## 2014-05-10 ENCOUNTER — Ambulatory Visit (INDEPENDENT_AMBULATORY_CARE_PROVIDER_SITE_OTHER): Payer: Commercial Managed Care - PPO

## 2014-05-10 ENCOUNTER — Encounter: Payer: Self-pay | Admitting: Podiatry

## 2014-05-10 ENCOUNTER — Ambulatory Visit (INDEPENDENT_AMBULATORY_CARE_PROVIDER_SITE_OTHER): Payer: Commercial Managed Care - PPO | Admitting: Podiatry

## 2014-05-10 VITALS — BP 131/79 | HR 69 | Resp 11

## 2014-05-10 DIAGNOSIS — Z9889 Other specified postprocedural states: Secondary | ICD-10-CM

## 2014-05-10 NOTE — Progress Notes (Signed)
   Subjective:    Patient ID: Christina ChristmasSusan Y Trent, female    DOB: 11/03/1950, 64 y.o.   MRN: [id number]  HPI Comments: DOS 03/02/2104 left austin bunionectomy, 3rd interspace neurectomy, and left 2nd hammer toe repair     Review of Systems     Objective:   Physical Exam        Assessment & Plan:

## 2014-05-12 NOTE — Progress Notes (Signed)
Subjective:     Patient ID: Christina ChristmasSusan Y Cherne, female   DOB: 09/29/1950, 64 y.o.   MRN: [id number]  HPI patient states I'm still getting some pain in my foot and overall I'm doing okay but I cannot walk distances or wear a lot of my shoes   Review of Systems     Objective:   Physical Exam  Her vascular status intact with muscle strength adequate range of motion within normal limits. Patient's left foot is healing well with incision sites on the left first metatarsal second toe third interspace well coapted with mild edema still noted but good range of motion of the first MPJ    Assessment:     Did well with continued discomfort related to the structural correction and the fact that the patient does tend to swell    Plan:     Reviewed x-rays and explained that this is a normal process that elevation compression and anti-inflammatories will be helpful. I believe will go on to uneventful healing long-term but unfortunately this does take time

## 2014-06-07 ENCOUNTER — Other Ambulatory Visit: Payer: Self-pay | Admitting: Neurology

## 2014-06-21 ENCOUNTER — Encounter: Payer: Self-pay | Admitting: Podiatry

## 2014-06-21 ENCOUNTER — Ambulatory Visit (INDEPENDENT_AMBULATORY_CARE_PROVIDER_SITE_OTHER): Payer: Commercial Managed Care - PPO | Admitting: Podiatry

## 2014-06-21 ENCOUNTER — Ambulatory Visit (INDEPENDENT_AMBULATORY_CARE_PROVIDER_SITE_OTHER): Payer: Commercial Managed Care - PPO

## 2014-06-21 VITALS — BP 122/77 | HR 66 | Resp 12

## 2014-06-21 DIAGNOSIS — Z9889 Other specified postprocedural states: Secondary | ICD-10-CM

## 2014-06-21 DIAGNOSIS — M2010 Hallux valgus (acquired), unspecified foot: Secondary | ICD-10-CM

## 2014-06-21 DIAGNOSIS — M779 Enthesopathy, unspecified: Secondary | ICD-10-CM

## 2014-06-21 MED ORDER — TRAMADOL HCL 50 MG PO TABS: 50.0000 mg | ORAL_TABLET | Freq: Three times a day (TID) | ORAL | Status: DC

## 2014-06-21 NOTE — Progress Notes (Signed)
Subjective:     Patient ID: Christina Wong, female   DOB: 04/17/1950, 64 y.o.   MRN: [id number]  HPI patient presents stating I'm still getting some pain in my foot and my arch hurts me on my foot when I do a lot of walking   Review of Systems     Objective:   Physical Exam Neurovascular status intact with minimal edema in the operated left foot with good structural alignment and good motion of the first MPJ. Moderate discomfort in the arch and the posterior tibial tendon area left over right    Assessment:     Doing well from surgery of the left foot with tendinitis-like symptoms    Plan:     Advised on physical therapy and at this time I went ahead and placed into a instructions on orthotics with scanning accomplished. As far as foot goes healing is going very well on x-ray and I advised on the continuation of elevation compression and gradual return to normal activities

## 2014-07-13 ENCOUNTER — Encounter: Payer: Self-pay | Admitting: Neurology

## 2014-07-13 ENCOUNTER — Ambulatory Visit: Payer: Commercial Managed Care - PPO | Admitting: *Deleted

## 2014-07-13 ENCOUNTER — Ambulatory Visit (INDEPENDENT_AMBULATORY_CARE_PROVIDER_SITE_OTHER): Payer: Commercial Managed Care - PPO | Admitting: Neurology

## 2014-07-13 VITALS — BP 112/76 | HR 65 | Wt 204.0 lb

## 2014-07-13 DIAGNOSIS — M436 Torticollis: Secondary | ICD-10-CM

## 2014-07-13 DIAGNOSIS — G43109 Migraine with aura, not intractable, without status migrainosus: Secondary | ICD-10-CM

## 2014-07-13 DIAGNOSIS — M779 Enthesopathy, unspecified: Secondary | ICD-10-CM

## 2014-07-13 HISTORY — DX: Torticollis: M43.6

## 2014-07-13 NOTE — Progress Notes (Signed)
Patient ID: Christina Wong, female   DOB: 11/20/1950, 64 y.o.   MRN: [id number] PICKING UP INSERTS

## 2014-07-13 NOTE — Patient Instructions (Signed)

## 2014-07-13 NOTE — Progress Notes (Signed)
[facility] [address] [place] [address] [phone]       OFFICE FOLLOW-UP NOTE  [name] Date of Birth:  Dec 30, 1950 Medical Record Number:  [id number]   HPI: 38 year Caucasian lady seen today for the first office follow-up visit following hospital admission for TIA on 09/01/2013. She presented with transient difficulty seeing started while she was at dinner 09/01/2013 at 7p. She states that she was looking at words and could only see half of the word, noting she could not see the right half. She then noticed that she was having trouble speaking. This lasted for somewhere around 10-20 minutes . Of note, she had been having headaches for the past few months the been getting more frequent, over the past week has had a headache everyday. It is left periorbital, not associated with photophobia or phonophobia, not associated with nausea and vomiting, non-throbbing in character. She denies jaw claudication. Patient was not administered TPA secondary to resolved symptoms. She was admitted for further evaluation and treatment.She qualified for an enrolled in the SOCRATES trial ( aspirin versus brillinta ).she has completed participation in the trial and is currently on Plavix 75 mg daily. She states her blood pressure cholesterol evident good control. She had another transient episode on 12/10/13 when she was not feeling well due to a bad cold She noticed that she had trouble texting and had trouble seeing images on the right side. She did not have any trouble speaking this time but did notice a mild headache. She felt tired and out of it for the rest of the day. She does have a remote history of migraine headaches and you would get vision or aspirin this was 35 years ago. She was concerned whether this could be a TIA and has requested to be seen urgently. She does have a trip planned to [place] and was concerned whether she could still travel. Update 03/09/2014 : She  returns for follow-up after last visit 3 months ago. She has not had any further episodes of vision difficulties, migraine-like headaches or TIAs. Patient states her blood pressure is quite well controlled and it is 100/65 today. She is on Crestor which she is tolerating well without side effects and last lipid profile was quite good. Her last hemoglobin A1c was optimal at 5.7 but she does not check fasting sugars regularly. She is is tolerating Plavix without bleeding, bruising or other side effects. She has no new complaints today. Update 07/13/2014 ; patient is seen urgently today as she called complaining about a possible TIA. She stated that last night when she was cooking dinner she noticed flashes of light on the right side of her peripheral field of vision. She laid down for about 10 minutes and the vision symptoms resolved but she had a mild headache which was bifrontal not severe. She woke up this morning she still has headaches and she had a transient word finding difficulties were talking for about 10 minutes. She still had mild bifrontal and retro-orbital headache since then. The patient had a somewhat similar episode a year ago when she had right-sided peripheral vision loss followed by a mild headache. She was thought to have a posterior dislocation TIA at that time and enrolled in the soccer at his trial. She also informs me that she may have had a third episode also of visual dysfunction with some headache in the past raising possibility of these episodes being complicated migraine rather than TIAs. She is  unable to and for specific triggers for these episodes. She does admit to not being able to handle stress very well. She recently had a stye in her right eye and blames the headache and eye pain to that. ROS:   14 system review of systems is positive for headache, vision difficulty,   Eye redness, iced I , light sensitivity, neck stiffness, speech difficulty and all other systems negative  PMH:   Past Medical History  Diagnosis Date  . Hypertension     hx of essential- on meds  . Hypercholesterolemia   . Exogenous obesity     She has had a problem with  . Elevated liver function tests     One of her studies are slightly elevated which may represent fatty liver from her Diabetes and her obesity or may represent an adverse effect from the Crestor.  . Infertility   . Leg cramps     in last 6 months  . TIA (transient ischemic attack) 09/01/2013    "that's what they think"  . Walking pneumonia ~ 1972  . Type II diabetes mellitus     mild  . Headache(784.0)     "daily to weekly lately" (09/02/2013)  . Neck stiffness 07/13/14    and neck pain  . Aphasia 07/11/13    x 10-15 min    Social History:  History   Social History  . Marital Status: Married    Spouse Name: N/A  . Number of Children: 1  . Years of Education: BA   Occupational History  . Not on file.   Social History Main Topics  . Smoking status: Never Smoker   . Smokeless tobacco: Never Used  . Alcohol Use: 1.8 oz/week    3 Glasses of wine per week  . Drug Use: No  . Sexual Activity:    Partners: Male   Other Topics Concern  . Not on file   Social History Narrative   Patient is married with 1 biological child, and 1 adopted child.   Patient is right handed.   Patient has her Bachelor's degree.   Patient drinks 1 cup daily.    Medications:   Current Outpatient Prescriptions on File Prior to Visit  Medication Sig Dispense Refill  . B Complex Vitamins (B COMPLEX PO) Take 1 tablet by mouth daily.     Marland Kitchen BIOTIN PO Take 1 tablet by mouth daily.    Marland Kitchen CALCIUM PO Take 1,000 mg by mouth daily.     . cetirizine (ZYRTEC) 10 MG tablet Take 10 mg by mouth daily.    . Cholecalciferol (VITAMIN D PO) Take 1 tablet by mouth daily.     . clopidogrel (PLAVIX) 75 MG tablet TAKE 1 TABLET ONCE DAILY. 90 tablet 1  . ibuprofen (ADVIL,MOTRIN) 200 MG tablet Take 400 mg by mouth every 6 (six) hours as needed for moderate pain.     Marland Kitchen losartan (COZAAR) 50 MG tablet Take 50 mg by mouth daily.    Marland Kitchen OVER THE COUNTER MEDICATION Apply 1 application topically daily as needed (for ear itching).    . potassium chloride SA (K-DUR,KLOR-CON) 20 MEQ tablet Take 20 mEq by mouth 2 (two) times daily.    . rosuvastatin (CRESTOR) 5 MG tablet Take 5 mg by mouth every morning.    . Saxagliptin-Metformin (KOMBIGLYZE XR) 06-998 MG TB24 Take 1 tablet by mouth daily.     . traMADol (ULTRAM) 50 MG tablet Take 1 tablet (50 mg total) by mouth 3 (three)  times daily. 90 tablet 2  . zolpidem (AMBIEN) 10 MG tablet Take 10 mg by mouth at bedtime. TAKING 1/2 TABLET AT BEDTIME    . [DISCONTINUED] Saxagliptin HCl (ONGLYZA PO) Take 1,000 mg by mouth.       No current facility-administered medications on file prior to visit.    Allergies:   Allergies  Allergen Reactions  . Lipitor [Atorvastatin] Other (See Comments)    Leg cramps  . Onion Nausea And Vomiting  . Bactrim [Sulfamethoxazole-Trimethoprim] Other (See Comments)    Muscle issues  . Sulfa Antibiotics Other (See Comments)    unknown    Physical Exam General: well developed, well nourished middle aged Caucasian lady, seated, in no evident distress Head: head normocephalic and atraumatic.  Neck: supple with no carotid or supraclavicular bruits Cardiovascular: regular rate and rhythm, no murmurs Musculoskeletal: no deformity.wearing left foot boot Skin:  no rash/petichiae Vascular:  Normal pulses all extremities Filed Vitals:   07/13/14 1439  BP: 112/76  Pulse: 65   Neurologic Exam Mental Status: Awake and fully alert. Oriented to place and time. Recent and remote memory intact. Attention span, concentration and fund of knowledge appropriate. Mood and affect appropriate.  Cranial Nerves: Fundoscopic exam not done   Pupils equal, briskly reactive to light. Extraocular movements full without nystagmus. Visual fields full to confrontation. Hearing intact. Facial sensation intact. Face,  tongue, palate moves normally and symmetrically.  Motor: Normal bulk and tone. Normal strength in all tested extremity muscles. Sensory.: intact to touch ,pinprick .position and vibratory sensation.  Coordination: Rapid alternating movements normal in all extremities. Finger-to-nose and heel-to-shin performed accurately bilaterally. Gait and Station: Arises from chair without difficulty. Stance is normal. Gait demonstrates normal stride length and balance . Able to heel, toe and tandem walk without difficulty.  Reflexes: 1+ and symmetric. Toes downgoing.      ASSESSMENT: [age] year Caucasian lady  With transient episode of difficulty speaking and right sided vision difficulties in July 2015 probably left brain TIA With vascular risk factors of diabetes, hypertension and hyperlipidemia.Patient has completed participation in the SOCRATES trial Remote history of migraine headaches   possibly complicated migraine episodes  with another episode 07/12/14 likely complicated migraine    PLAN:   I had a long discussion with the patient and her husband regarding her now recurrent episodes of transient vision disturbance followed by headache and today transient speech difficulties likely representing complicated migraine episodes. I advised her to avoid triggers and increase participation in activities for stress relaxation like regular meditation, exercise yoga. I also gave her regular neck stretching exercises. Continue Plavix for stroke prevention with strict control of hypertension with blood pressure goal below 130/90, lipids with LDL cholesterol goal below 70 mg percent and diabetes with hemoglobin A1c goal below 6.5 percent. She was advised to keep follow-up schedule appointment with me in August 2016 or call earlier if necessary.  Delia HeadyPramod Keldan Eplin, MD    Note: This document was prepared with digital dictation and possible smart phrase technology. Any transcriptional errors that result from this process are  unintentional

## 2014-07-13 NOTE — Patient Instructions (Signed)
I had a long discussion with the patient and her husband regarding her now recurrent episodes of transient vision disturbance followed by headache and today transient speech difficulties likely representing complicated migraine episodes. I advised her to avoid triggers and increase participation in activities for stress relaxation like regular meditation, exercise yoga. I also gave her regular neck stretching exercises. Continue Plavix for stroke prevention with strict control of hypertension with blood pressure goal below 130/90, lipids with LDL cholesterol goal below 70 mg percent and diabetes with hemoglobin A1c goal below 6.5 percent. She was advised to keep follow-up schedule appointment with me in August 2016 or call earlier if necessary.

## 2014-07-30 ENCOUNTER — Ambulatory Visit (HOSPITAL_COMMUNITY)
Admission: RE | Admit: 2014-07-30 | Discharge: 2014-07-30 | Disposition: A | Discharge status: Home / Self Care | Payer: Commercial Managed Care - PPO | Source: Ambulatory Visit | Attending: Family Medicine | Admitting: Family Medicine

## 2014-07-30 ENCOUNTER — Other Ambulatory Visit (HOSPITAL_COMMUNITY): Payer: Self-pay | Admitting: Family Medicine

## 2014-07-30 DIAGNOSIS — R52 Pain, unspecified: Secondary | ICD-10-CM

## 2014-07-30 DIAGNOSIS — M7989 Other specified soft tissue disorders: Secondary | ICD-10-CM | POA: Diagnosis present

## 2014-07-30 NOTE — Progress Notes (Signed)
*  PRELIMINARY RESULTS* Vascular Ultrasound Lower extremity venous duplex has been completed.  Preliminary findings: Right = negative for DVT  Farrel Demark, RDMS, RVT  07/30/2014, 4:45 PM

## 2014-08-16 ENCOUNTER — Other Ambulatory Visit: Payer: Self-pay

## 2014-08-16 DIAGNOSIS — Z1231 Encounter for screening mammogram for malignant neoplasm of breast: Secondary | ICD-10-CM

## 2014-09-16 ENCOUNTER — Ambulatory Visit: Payer: Commercial Managed Care - PPO

## 2014-09-20 ENCOUNTER — Ambulatory Visit: Payer: Commercial Managed Care - PPO | Admitting: Neurology

## 2014-10-27 ENCOUNTER — Ambulatory Visit: Payer: Commercial Managed Care - PPO

## 2014-11-18 ENCOUNTER — Ambulatory Visit
Admission: RE | Admit: 2014-11-18 | Discharge: 2014-11-18 | Disposition: A | Discharge status: Home / Self Care | Payer: Commercial Managed Care - PPO | Source: Ambulatory Visit

## 2014-11-18 DIAGNOSIS — Z1231 Encounter for screening mammogram for malignant neoplasm of breast: Secondary | ICD-10-CM

## 2014-12-20 ENCOUNTER — Other Ambulatory Visit: Payer: Self-pay | Admitting: Neurology

## 2015-01-04 ENCOUNTER — Encounter: Payer: Self-pay | Admitting: Obstetrics & Gynecology

## 2015-01-04 ENCOUNTER — Ambulatory Visit (INDEPENDENT_AMBULATORY_CARE_PROVIDER_SITE_OTHER): Payer: Commercial Managed Care - PPO | Admitting: Obstetrics & Gynecology

## 2015-01-04 VITALS — BP 128/82 | HR 68 | Resp 16 | Ht 62.75 in | Wt 206.0 lb

## 2015-01-04 DIAGNOSIS — Z01419 Encounter for gynecological examination (general) (routine) without abnormal findings: Secondary | ICD-10-CM | POA: Diagnosis not present

## 2015-01-04 NOTE — Progress Notes (Signed)
64 y.o. G1P1 MarriedCaucasianF here for annual exam.  Doing well.  Had a new six week old granddaughter.  Son [place] daughter in [place] [employer] [place].    Neurologist:  Dr. Pearlean BrownieSethi.  She reports he really feels she has complicated migraines.  Still on Plavix.    PCP:  Dr. Nicholos Johnseade.  Has typically a follow up.     Patient's last menstrual period was 02/05/2006.          Sexually active: Yes.    The current method of family planning is post menopausal status.    Exercising: Yes.    walking Smoker:  no  Health Maintenance: Pap:  11/18/13 WNL, neg pap with neg HR HPV 2013 History of abnormal Pap:  no MMG:  11/18/14 3D BiRads 1-negative Colonoscopy:  10 years ago, scheduled for 01/24/15 BMD:   07/21/12 TDaP:  5/10 Screening Labs: PCP, Hb today: PCP, Urine today: PCP   reports that she has never smoked. She has never used smokeless tobacco. She reports that she drinks alcohol. She reports that she does not use illicit drugs.  Past Medical History  Diagnosis Date  . Hypertension     hx of essential- on meds  . Hypercholesterolemia   . Exogenous obesity     She has had a problem with  . Elevated liver function tests     One of her studies are slightly elevated which may represent fatty liver from her Diabetes and her obesity or may represent an adverse effect from the Crestor.  . Infertility   . Leg cramps     in last 6 months  . TIA (transient ischemic attack) 09/01/2013    "that's what they think"  . Walking pneumonia ~ 1972  . Type II diabetes mellitus (HCC)     mild  . Headache(784.0)     "daily to weekly lately" (09/02/2013)  . Neck stiffness 07/13/14    and neck pain  . Aphasia 07/11/13    x 10-15 min    Past Surgical History  Procedure Laterality Date  . Cardiovascular stress test  06-29-2002    EF is normal at 55%  . Ankle fracture surgery Left ~ 2007  . Anterior cervical decomp/discectomy fusion  2001  . Dilation and curettage of uterus    . Laparoscopy     multiple s/p infertility  . Pilonidal cyst excision    . Knee arthroscopy with meniscal repair Right 2/14  . Tonsillectomy  1960's  . Knee arthroscopy Left 1980's  . Foot surgery Left 02/2014    Family History  Problem Relation Age of Onset  . Diabetes Father   . Hypertension Father   . Hypertension Paternal Grandmother   . Heart Problems Mother     aortic valve replacement  . CVA Father   . CVA Paternal Grandmother     ROS:  Pertinent items are noted in HPI.  Otherwise, a comprehensive ROS was negative.  Exam:   BP 128/82 mmHg  Pulse 68  Resp 16  Ht 5' 2.75" (1.594 m)  Wt 206 lb (93.441 kg)  BMI 36.78 kg/m2  LMP 02/05/2006  Weight change: +11#  Height: 5' 2.75" (159.4 cm)  Ht Readings from Last 3 Encounters:  01/04/15 5' 2.75" (1.594 m)  03/09/14 5' 1.25" (1.556 m)  12/16/13 5' 4.25" (1.632 m)    General appearance: alert, cooperative and appears stated age Head: Normocephalic, without obvious abnormality, atraumatic Neck: no adenopathy, supple, symmetrical, trachea midline  and thyroid normal to inspection and palpation Lungs: clear to auscultation bilaterally Breasts: normal appearance, no masses or tenderness Heart: regular rate and rhythm Abdomen: soft, non-tender; bowel sounds normal; no masses,  no organomegaly Extremities: extremities normal, atraumatic, no cyanosis or edema Skin: Skin color, texture, turgor normal. No rashes or lesions Lymph nodes: Cervical, supraclavicular, and axillary nodes normal. No abnormal inguinal nodes palpated Neurologic: Grossly normal   Pelvic: External genitalia:  no lesions              Urethra:  normal appearing urethra with no masses, tenderness or lesions              Bartholins and Skenes: normal                 Vagina: normal appearing vagina with normal color and discharge, no lesions              Cervix: no lesions              Pap taken: No. Bimanual Exam:  Uterus:  normal size, contour, position, consistency,  mobility, non-tender              Adnexa: normal adnexa and no mass, fullness, tenderness               Rectovaginal: Confirms               Anus:  normal sphincter tone, no lesions  Chaperone was present for exam.  A:  Well Woman with normal exam  PMP, no HRT  DM, hypertension, elevated lipids  Possible TIA vs. Complicated migraines.  Followed by Dr. Pearlean Brownie.  Hemorrhoids  P: Mammogram yearly pap smear with neg HR HPV 7/13. Pap 2015.  No pap today.   Labs all with Dr. Nicholos Johns and Dr. Patty Sermons return annually or prn

## 2015-01-04 NOTE — Patient Instructions (Signed)
Check about pneumonia vaccine.

## 2015-01-14 ENCOUNTER — Telehealth: Payer: Self-pay | Admitting: Cardiology

## 2015-01-14 NOTE — Telephone Encounter (Signed)
New message      Request for surgical clearance:  What type of surgery is being performed? colonscopy When is this surgery scheduled? 01-24-15 Are there any medications that need to be held prior to surgery and how long? Hold plavix?  If yes, for how long 1. Name of physician performing surgery?  Dr Loreta AveMann  2. What is your office phone and fax number? Fax [phone]

## 2015-01-14 NOTE — Telephone Encounter (Signed)
Will forward to  Dr. Brackbill for review 

## 2015-01-15 NOTE — Telephone Encounter (Signed)
Not seen in our office since 08/2012 Her PCP is Dr Elias Elseobert Reade. Her neurologist is Dr. Pearlean BrownieSethi. She is on plavix because of TIA. Dr. Loreta AveMann would need to check with him.

## 2015-01-19 NOTE — Telephone Encounter (Signed)
Faxed to Dr Kenna GilbertMann's office, confirmation received

## 2015-08-06 ENCOUNTER — Other Ambulatory Visit: Payer: Self-pay | Admitting: Neurology

## 2015-08-10 ENCOUNTER — Other Ambulatory Visit: Payer: Self-pay | Admitting: Neurology

## 2015-08-11 NOTE — Telephone Encounter (Signed)
Patient requesting refill of  clopidogrel (PLAVIX) 75 MG tablet. Patient has an appointment with Dr. Pearlean BrownieSethi on 10-06-15. Please call to [facility].

## 2015-09-21 NOTE — Progress Notes (Signed)
[facility] at [phone]. Pt schedule to have colonoscopy.

## 2015-10-06 ENCOUNTER — Ambulatory Visit (INDEPENDENT_AMBULATORY_CARE_PROVIDER_SITE_OTHER): Payer: Commercial Managed Care - PPO | Admitting: Neurology

## 2015-10-06 ENCOUNTER — Encounter: Payer: Self-pay | Admitting: Neurology

## 2015-10-06 VITALS — BP 108/75 | HR 69 | Ht 62.75 in | Wt 208.8 lb

## 2015-10-06 DIAGNOSIS — G43809 Other migraine, not intractable, without status migrainosus: Secondary | ICD-10-CM | POA: Diagnosis not present

## 2015-10-06 MED ORDER — CLOPIDOGREL BISULFATE 75 MG PO TABS: 75.0000 mg | ORAL_TABLET | Freq: Every day | ORAL | 3 refills | Status: DC

## 2015-10-06 NOTE — Patient Instructions (Signed)
I had a long discussion with the patient with regards to her history of TIA as well as Complicated migraine. I recommend she continue Plavix for secondary stroke prevention as well as maintain strict control of hypertension with blood pressure goal below 130/90 and lipids with LDL cholesterol goal below 70 mg percent and diabetes with hemoglobin A1c goal below 7. I also encouraged her to eat a healthy diet and exercise regularly and maintain ideal body weight. She was given a refill for Plavix. No routine scheduled follow-up appointment with me is necessary and she may be referred back in the future only as needed.

## 2015-10-11 ENCOUNTER — Encounter: Payer: Self-pay | Admitting: Neurology

## 2015-10-11 NOTE — Progress Notes (Signed)
[facility] [address] [place] [address] [phone]       OFFICE FOLLOW-UP NOTE  Ms. [name] Date of Birth:  Dec 30, 1950 Medical Record Number:  [id number]   HPI: 38 year Caucasian lady seen today for the first office follow-up visit following hospital admission for TIA on 09/01/2013. She presented with transient difficulty seeing started while she was at dinner 09/01/2013 at 7p. She states that she was looking at words and could only see half of the word, noting she could not see the right half. She then noticed that she was having trouble speaking. This lasted for somewhere around 10-20 minutes . Of note, she had been having headaches for the past few months the been getting more frequent, over the past week has had a headache everyday. It is left periorbital, not associated with photophobia or phonophobia, not associated with nausea and vomiting, non-throbbing in character. She denies jaw claudication. Patient was not administered TPA secondary to resolved symptoms. She was admitted for further evaluation and treatment.She qualified for an enrolled in the SOCRATES trial ( aspirin versus brillinta ).she has completed participation in the trial and is currently on Plavix 75 mg daily. She states her blood pressure cholesterol evident good control. She had another transient episode on 12/10/13 when she was not feeling well due to a bad cold She noticed that she had trouble texting and had trouble seeing images on the right side. She did not have any trouble speaking this time but did notice a mild headache. She felt tired and out of it for the rest of the day. She does have a remote history of migraine headaches and you would get vision or aspirin this was 35 years ago. She was concerned whether this could be a TIA and has requested to be seen urgently. She does have a trip planned to [place] and was concerned whether she could still travel. Update 03/09/2014 : She  returns for follow-up after last visit 3 months ago. She has not had any further episodes of vision difficulties, migraine-like headaches or TIAs. Patient states her blood pressure is quite well controlled and it is 100/65 today. She is on Crestor which she is tolerating well without side effects and last lipid profile was quite good. Her last hemoglobin A1c was optimal at 5.7 but she does not check fasting sugars regularly. She is is tolerating Plavix without bleeding, bruising or other side effects. She has no new complaints today. Update 07/13/2014 ; patient is seen urgently today as she called complaining about a possible TIA. She stated that last night when she was cooking dinner she noticed flashes of light on the right side of her peripheral field of vision. She laid down for about 10 minutes and the vision symptoms resolved but she had a mild headache which was bifrontal not severe. She woke up this morning she still has headaches and she had a transient word finding difficulties were talking for about 10 minutes. She still had mild bifrontal and retro-orbital headache since then. The patient had a somewhat similar episode a year ago when she had right-sided peripheral vision loss followed by a mild headache. She was thought to have a posterior dislocation TIA at that time and enrolled in the soccer at his trial. She also informs me that she may have had a third episode also of visual dysfunction with some headache in the past raising possibility of these episodes being complicated migraine rather than TIAs. She is  unable to and for specific triggers for these episodes. She does admit to not being able to handle stress very well. She recently had a stye in her right eye and blames the headache and eye pain to that. Update 10/06/2015 ;She returns for f/u after last visit 15 months ago. She is doing well without any recurrent TIA or complicated migraine episodes. She states her headaches are also gone and she  has no new complaints.She remains on plavix which she is tolerating well without bleeding but only minor brusing. She wants to know if she should continue plavix. She states her BP is well controlled. And today it is 108/75. She is also tolerating crestor without myalgias and last lipid profile was satisfactory. ROS:   14 system review of systems is positive for headache, vision difficulty,     speech difficulty and all other systems negative  PMH:  Past Medical History:  Diagnosis Date  . Aphasia 07/11/13   x 10-15 min  . Elevated liver function tests    One of her studies are slightly elevated which may represent fatty liver from her Diabetes and her obesity or may represent an adverse effect from the Crestor.  . Exogenous obesity    She has had a problem with  . Headache(784.0)    "daily to weekly lately" (09/02/2013)  . Hypercholesterolemia   . Hypertension    hx of essential- on meds  . Infertility   . Leg cramps    in last 6 months  . Neck stiffness 07/13/14   and neck pain  . TIA (transient ischemic attack) 09/01/2013   "that's what they think"  . Type II diabetes mellitus (HCC)    mild  . Walking pneumonia ~ 1972    Social History:  Social History   Social History  . Marital status: Married    Spouse name: N/A  . Number of children: 1  . Years of education: BA   Occupational History  . Not on file.   Social History Main Topics  . Smoking status: Never Smoker  . Smokeless tobacco: Never Used  . Alcohol use 0.6 oz/week    1 Glasses of wine per week     Comment: glass of wine daily  . Drug use: No  . Sexual activity: Yes    Partners: Male   Other Topics Concern  . Not on file   Social History Narrative   Patient is married with 1 biological child, and 1 adopted child.   Patient is right handed.   Patient has her Bachelor's degree.   Patient drinks 1 cup daily.    Medications:   Current Outpatient Prescriptions on File Prior to Visit  Medication Sig  Dispense Refill  . B Complex Vitamins (B COMPLEX PO) Take 1 tablet by mouth daily.     Marland Kitchen BIOTIN PO Take 1 tablet by mouth daily.    Marland Kitchen CALCIUM PO Take 1,000 mg by mouth daily.     . cetirizine (ZYRTEC) 10 MG tablet Take 10 mg by mouth daily.    . Cholecalciferol (VITAMIN D PO) Take 1 tablet by mouth daily.     Marland Kitchen doxycycline (ORACEA) 40 MG capsule Take 40 mg by mouth as needed.     Marland Kitchen losartan (COZAAR) 50 MG tablet Take 50 mg by mouth daily.    Marland Kitchen OVER THE COUNTER MEDICATION Apply 1 application topically daily as needed (for ear itching).    . potassium chloride SA (K-DUR,KLOR-CON) 20 MEQ tablet Take 20  mEq by mouth 2 (two) times daily.    . rosuvastatin (CRESTOR) 5 MG tablet Take 5 mg by mouth every morning.    . Saxagliptin-Metformin (KOMBIGLYZE XR) 06-998 MG TB24 Take 1 tablet by mouth daily.     . traMADol (ULTRAM) 50 MG tablet Take 1 tablet (50 mg total) by mouth 3 (three) times daily. (Patient taking differently: Take 50 mg by mouth every 6 (six) hours as needed. ) 90 tablet 2  . zolpidem (AMBIEN) 10 MG tablet Take 10 mg by mouth at bedtime. TAKING 1/2 TABLET AT BEDTIME    . [DISCONTINUED] Saxagliptin HCl (ONGLYZA PO) Take 1,000 mg by mouth.       No current facility-administered medications on file prior to visit.     Allergies:   Allergies  Allergen Reactions  . Lipitor [Atorvastatin] Other (See Comments)    Leg cramps  . Onion Nausea And Vomiting  . Bactrim [Sulfamethoxazole-Trimethoprim] Other (See Comments)    Muscle issues  . Sulfa Antibiotics Other (See Comments)    unknown    Physical Exam General: well developed, well nourished middle aged Caucasian lady, seated, in no evident distress Head: head normocephalic and atraumatic.  Neck: supple with no carotid or supraclavicular bruits Cardiovascular: regular rate and rhythm, no murmurs Musculoskeletal: no deformity.wearing left foot boot Skin:  no rash/petichiae Vascular:  Normal pulses all extremities Vitals:    10/06/15 0943  BP: 108/75  Pulse: 69   Neurologic Exam Mental Status: Awake and fully alert. Oriented to place and time. Recent and remote memory intact. Attention span, concentration and fund of knowledge appropriate. Mood and affect appropriate.  Cranial Nerves: Fundoscopic exam not done   Pupils equal, briskly reactive to light. Extraocular movements full without nystagmus. Visual fields full to confrontation. Hearing intact. Facial sensation intact. Face, tongue, palate moves normally and symmetrically.  Motor: Normal bulk and tone. Normal strength in all tested extremity muscles. Sensory.: intact to touch ,pinprick .position and vibratory sensation.  Coordination: Rapid alternating movements normal in all extremities. Finger-to-nose and heel-to-shin performed accurately bilaterally. Gait and Station: Arises from chair without difficulty. Stance is normal. Gait demonstrates normal stride length and balance . Able to heel, toe and tandem walk without difficulty.  Reflexes: 1+ and symmetric. Toes downgoing.      ASSESSMENT: [age] year Caucasian lady  With transient episode of difficulty speaking and right sided vision difficulties in July 2015 probably left brain TIA With vascular risk factors of diabetes, hypertension and hyperlipidemia.Patient has completed participation in the SOCRATES trial Remote history of migraine headaches   possibly complicated migraine episodes  with another episode 07/12/14 likely complicated migraine    PLAN:  I had a long discussion with the patient with regards to her history of TIA as well as Complicated migraine. I recommend she continue Plavix for secondary stroke prevention as well as maintain strict control of hypertension with blood pressure goal below 130/90 and lipids with LDL cholesterol goal below 70 mg percent and diabetes with hemoglobin A1c goal below 7. I also encouraged her to eat a healthy diet and exercise regularly and maintain ideal body weight.  She was given a refill for Plavix. No routine scheduled follow-up appointment with me is necessary and she may be referred back in the future only as needed.  Delia HeadyPramod Dirk Vanaman, MD    Note: This document was prepared with digital dictation and possible smart phrase technology. Any transcriptional errors that result from this process are unintentional

## 2015-10-14 ENCOUNTER — Telehealth: Payer: Self-pay | Admitting: Neurology

## 2015-10-14 NOTE — Telephone Encounter (Signed)
Christina Wong called, states when she was here for visit August 31st, Dr. Pearlean BrownieSethi released her, since then she had episode Monday night, couldn't write the right words in a text and occasionally stumbled over saying words this week,  and a few minor things since then, has also had bad headaches. A friend recommended essential lemon oil that has helped with headaches. Please call to advise.

## 2015-10-14 NOTE — Telephone Encounter (Signed)
Message sent to Dr.Sethi. Pt was just seen 10/06/2015.

## 2015-10-17 NOTE — Telephone Encounter (Signed)
I spoke to the patient. She had an episode of week ago transient word finding difficulties followed by headache for couple of days and tiredness but is now feeling better. This was probably a complicatedd migraine episode. I advised the patient to get enough rest and drink adequate fluids. If this happens again or she has meals or worsening symptoms she was advised to call and may need pain imaging studies and vascular studies at that point. She voiced understanding. She is not having frequent enough episodes at the present time to justify prophylactic medications. She was advised to continue Plavix.

## 2015-11-15 ENCOUNTER — Other Ambulatory Visit: Payer: Self-pay | Admitting: Obstetrics & Gynecology

## 2015-11-15 DIAGNOSIS — Z1231 Encounter for screening mammogram for malignant neoplasm of breast: Secondary | ICD-10-CM

## 2015-11-18 LAB — HM COLONOSCOPY

## 2015-11-29 ENCOUNTER — Ambulatory Visit
Admission: RE | Admit: 2015-11-29 | Discharge: 2015-11-29 | Disposition: A | Discharge status: Home / Self Care | Payer: Commercial Managed Care - PPO | Source: Ambulatory Visit | Attending: Obstetrics & Gynecology | Admitting: Obstetrics & Gynecology

## 2015-11-29 DIAGNOSIS — Z1231 Encounter for screening mammogram for malignant neoplasm of breast: Secondary | ICD-10-CM

## 2016-03-26 ENCOUNTER — Encounter: Payer: Self-pay | Admitting: Obstetrics & Gynecology

## 2016-03-26 ENCOUNTER — Ambulatory Visit (INDEPENDENT_AMBULATORY_CARE_PROVIDER_SITE_OTHER): Payer: Commercial Managed Care - PPO | Admitting: Obstetrics & Gynecology

## 2016-03-26 VITALS — BP 110/62 | HR 64 | Resp 16 | Ht 63.0 in | Wt 208.0 lb

## 2016-03-26 DIAGNOSIS — Z124 Encounter for screening for malignant neoplasm of cervix: Secondary | ICD-10-CM | POA: Diagnosis not present

## 2016-03-26 DIAGNOSIS — Z205 Contact with and (suspected) exposure to viral hepatitis: Secondary | ICD-10-CM

## 2016-03-26 DIAGNOSIS — Z01419 Encounter for gynecological examination (general) (routine) without abnormal findings: Secondary | ICD-10-CM | POA: Diagnosis not present

## 2016-03-26 NOTE — Progress Notes (Signed)
66 y.o. G1P1 Married Caucasian F here for annual exam.  Son, daughter-in-law, and two grandchildren moved from [place].  He resigned from [employer].  Oldest is engaged and getting married in June.  This makes her very happy.    Had a second neurologic event that was either a TIA vs complicated migraine.  Follow by Dr. Pearlean Brownie.  No vaginal bleeding.   Last HbA1C was 5.7.  Patient's last menstrual period was 02/05/2006.          Sexually active: Yes.    The current method of family planning is post menopausal status.    Exercising: No.  The patient does not participate in regular exercise at present. Smoker:  no  Health Maintenance: Pap:  11/18/13 negative  History of abnormal Pap:  no MMG:  11/29/15 BIRADS 1 negative  Colonoscopy:  11/18/15 polyps BMD:   07/21/12 normal  TDaP:  5/10  Pneumonia vaccine(s):  PCP Zostavax:   PCP Hep C testing: discuss today  Screening Labs: PCP takes care of all labs   reports that she has never smoked. She has never used smokeless tobacco. She reports that she drinks about 0.6 oz of alcohol per week . She reports that she does not use drugs.  Past Medical History:  Diagnosis Date  . Aphasia 07/11/13   x 10-15 min  . Elevated liver function tests    One of her studies are slightly elevated which may represent fatty liver from her Diabetes and her obesity or may represent an adverse effect from the Crestor.  . Exogenous obesity    She has had a problem with  . Headache(784.0)    "daily to weekly lately" (09/02/2013)  . Hypercholesterolemia   . Hypertension    hx of essential- on meds  . Infertility   . Leg cramps    in last 6 months  . Neck stiffness 07/13/14   and neck pain  . TIA (transient ischemic attack) 09/01/2013   "that's what they think"  . Type II diabetes mellitus (HCC)    mild  . Walking pneumonia ~ 1972    Past Surgical History:  Procedure Laterality Date  . ANKLE FRACTURE SURGERY Left ~ 2007  . ANTERIOR CERVICAL  DECOMP/DISCECTOMY FUSION  2001  . CARDIOVASCULAR STRESS TEST  06-29-2002   EF is normal at 55%  . DILATION AND CURETTAGE OF UTERUS    . FOOT SURGERY Left 02/2014  . KNEE ARTHROSCOPY Left 1980's  . KNEE ARTHROSCOPY WITH MENISCAL REPAIR Right 2/14  . LAPAROSCOPY     multiple s/p infertility  . PILONIDAL CYST EXCISION    . TONSILLECTOMY  1960's    Current Outpatient Prescriptions  Medication Sig Dispense Refill  . B Complex Vitamins (B COMPLEX PO) Take 1 tablet by mouth daily.     Marland Kitchen BIOTIN PO Take 1 tablet by mouth daily.    Marland Kitchen CALCIUM PO Take 1,000 mg by mouth daily.     . cetirizine (ZYRTEC) 10 MG tablet Take 10 mg by mouth daily.    . Cholecalciferol (VITAMIN D PO) Take 1 tablet by mouth daily.     . clopidogrel (PLAVIX) 75 MG tablet Take 1 tablet (75 mg total) by mouth daily. 90 tablet 3  . doxycycline (ORACEA) 40 MG capsule Take 40 mg by mouth as needed.     Marland Kitchen losartan (COZAAR) 50 MG tablet Take 50 mg by mouth daily.    Marland Kitchen OVER THE COUNTER MEDICATION Apply 1 application topically daily as needed (  for ear itching).    . potassium chloride SA (K-DUR,KLOR-CON) 20 MEQ tablet Take 20 mEq by mouth 2 (two) times daily.    . rosuvastatin (CRESTOR) 5 MG tablet Take 5 mg by mouth every morning.    . Saxagliptin-Metformin (KOMBIGLYZE XR) 06-998 MG TB24 Take 1 tablet by mouth daily.     . traMADol (ULTRAM) 50 MG tablet Take 1 tablet (50 mg total) by mouth 3 (three) times daily. (Patient taking differently: Take 50 mg by mouth every 6 (six) hours as needed. ) 90 tablet 2  . zolpidem (AMBIEN) 10 MG tablet Take 10 mg by mouth at bedtime. TAKING 1/2 TABLET AT BEDTIME     No current facility-administered medications for this visit.     Family History  Problem Relation Age of Onset  . Heart Problems Mother     aortic valve replacement  . Diabetes Father   . Hypertension Father   . CVA Father   . Hypertension Paternal Grandmother   . CVA Paternal Grandmother     ROS:  Pertinent items are  noted in HPI.  Otherwise, a comprehensive ROS was negative.  Exam:   BP 110/62 (BP Location: Right Arm, Patient Position: Sitting, Cuff Size: Normal)   Pulse 64   Resp 16   Ht 5\' 3"  (1.6 m)   Wt 208 lb (94.3 kg)   LMP 02/05/2006   BMI 36.85 kg/m   Weight change: +2#   Height: 5\' 3"  (160 cm)  Ht Readings from Last 3 Encounters:  03/26/16 5\' 3"  (1.6 m)  10/06/15 5' 2.75" (1.594 m)  01/04/15 5' 2.75" (1.594 m)    General appearance: alert, cooperative and appears stated age Head: Normocephalic, without obvious abnormality, atraumatic Neck: no adenopathy, supple, symmetrical, trachea midline and thyroid normal to inspection and palpation Lungs: clear to auscultation bilaterally Breasts: normal appearance, no masses or tenderness Heart: regular rate and rhythm Abdomen: soft, non-tender; bowel sounds normal; no masses,  no organomegaly Extremities: extremities normal, atraumatic, no cyanosis or edema Skin: Skin color, texture, turgor normal. No rashes or lesions Lymph nodes: Cervical, supraclavicular, and axillary nodes normal. No abnormal inguinal nodes palpated Neurologic: Grossly normal   Pelvic: External genitalia:  no lesions              Urethra:  normal appearing urethra with no masses, tenderness or lesions              Bartholins and Skenes: normal                 Vagina: normal appearing vagina with normal color and discharge, no lesions              Cervix: no lesions              Pap taken: Yes.   Bimanual Exam:  Uterus:  normal size, contour, position, consistency, mobility, non-tender              Adnexa: normal adnexa and no mass, fullness, tenderness               Rectovaginal: Confirms               Anus:  normal sphincter tone, no lesions  Chaperone was present for exam.  A:  Well Woman with normal exam PMP, no HRT DM Hypertension Elevated lipids  P:   Mammogram yearly.  Guidelines reveiwed. pap smear and HR HPV obtained Hep C antibody obtained Labs  and vaccines UTD with Dr. Nicholos Johnseade  Return annually or prn

## 2016-03-27 LAB — HEPATITIS C ANTIBODY: HCV Ab: NEGATIVE

## 2016-03-28 LAB — IPS PAP TEST WITH HPV

## 2016-04-24 ENCOUNTER — Other Ambulatory Visit: Payer: Self-pay | Admitting: Family Medicine

## 2016-04-24 DIAGNOSIS — E2839 Other primary ovarian failure: Secondary | ICD-10-CM

## 2016-04-26 ENCOUNTER — Ambulatory Visit: Payer: Commercial Managed Care - PPO | Admitting: Obstetrics & Gynecology

## 2016-05-03 ENCOUNTER — Ambulatory Visit
Admission: RE | Admit: 2016-05-03 | Discharge: 2016-05-03 | Disposition: A | Discharge status: Home / Self Care | Payer: Commercial Managed Care - PPO | Source: Ambulatory Visit | Attending: Family Medicine | Admitting: Family Medicine

## 2016-05-03 DIAGNOSIS — E2839 Other primary ovarian failure: Secondary | ICD-10-CM

## 2016-09-17 ENCOUNTER — Other Ambulatory Visit: Payer: Self-pay | Admitting: Neurology

## 2016-09-18 ENCOUNTER — Other Ambulatory Visit: Payer: Self-pay

## 2016-09-18 MED ORDER — CLOPIDOGREL BISULFATE 75 MG PO TABS: 75.0000 mg | ORAL_TABLET | Freq: Every day | ORAL | 0 refills | Status: DC

## 2016-12-26 ENCOUNTER — Other Ambulatory Visit: Payer: Self-pay | Admitting: Neurology

## 2017-01-04 ENCOUNTER — Other Ambulatory Visit: Payer: Self-pay | Admitting: Obstetrics & Gynecology

## 2017-01-04 DIAGNOSIS — Z1231 Encounter for screening mammogram for malignant neoplasm of breast: Secondary | ICD-10-CM

## 2017-02-01 ENCOUNTER — Other Ambulatory Visit: Payer: Self-pay | Admitting: Family Medicine

## 2017-02-01 ENCOUNTER — Ambulatory Visit
Admission: RE | Admit: 2017-02-01 | Discharge: 2017-02-01 | Disposition: A | Discharge status: Home / Self Care | Payer: Commercial Managed Care - PPO | Source: Ambulatory Visit | Attending: Family Medicine | Admitting: Family Medicine

## 2017-02-01 DIAGNOSIS — R053 Chronic cough: Secondary | ICD-10-CM

## 2017-02-01 DIAGNOSIS — R05 Cough: Secondary | ICD-10-CM

## 2017-02-19 ENCOUNTER — Telehealth: Payer: Self-pay | Admitting: Obstetrics & Gynecology

## 2017-02-19 NOTE — Telephone Encounter (Signed)
Patient is out of town in [place] and thinks she may have a yeast infection.  Having pain when she urinates and no discharge.  Patient aware we do not treat over the phone.

## 2017-02-19 NOTE — Telephone Encounter (Signed)
Spoke with patient. On vacation in [place], thinks she may have UTI. Painful urination started this morning. Has chronic lower back pain.   Denies fever/chills, N/V, blood in urine.   Advised evaluation recommended, seek care at local ER/Urgent Care to ensure appropriate treatment. Return call to office if OV still needed after returning from vacation. Dr. Hyacinth MeekerMiller will review, I will return call with any additional recommendations. Patient verbalizes understanding and is agreeable.   Last AEX 03/26/16 S.M  Routing to provider for final review. Patient is agreeable to disposition. Will close encounter.

## 2017-02-25 ENCOUNTER — Ambulatory Visit
Admission: RE | Admit: 2017-02-25 | Discharge: 2017-02-25 | Disposition: A | Discharge status: Home / Self Care | Payer: Commercial Managed Care - PPO | Source: Ambulatory Visit | Attending: Obstetrics & Gynecology | Admitting: Obstetrics & Gynecology

## 2017-02-25 DIAGNOSIS — Z1231 Encounter for screening mammogram for malignant neoplasm of breast: Secondary | ICD-10-CM

## 2017-03-20 ENCOUNTER — Institutional Professional Consult (permissible substitution): Payer: Commercial Managed Care - PPO | Admitting: Internal Medicine

## 2017-03-21 ENCOUNTER — Encounter: Payer: Self-pay | Admitting: Internal Medicine

## 2017-03-21 ENCOUNTER — Ambulatory Visit (INDEPENDENT_AMBULATORY_CARE_PROVIDER_SITE_OTHER): Payer: Commercial Managed Care - PPO | Admitting: Internal Medicine

## 2017-03-21 ENCOUNTER — Other Ambulatory Visit (INDEPENDENT_AMBULATORY_CARE_PROVIDER_SITE_OTHER): Payer: Commercial Managed Care - PPO

## 2017-03-21 ENCOUNTER — Ambulatory Visit (INDEPENDENT_AMBULATORY_CARE_PROVIDER_SITE_OTHER)
Admission: RE | Admit: 2017-03-21 | Discharge: 2017-03-21 | Disposition: A | Discharge status: Home / Self Care | Payer: Commercial Managed Care - PPO | Source: Ambulatory Visit | Attending: Internal Medicine | Admitting: Internal Medicine

## 2017-03-21 VITALS — BP 138/76 | HR 102 | Ht 63.5 in | Wt 202.6 lb

## 2017-03-21 DIAGNOSIS — J45991 Cough variant asthma: Secondary | ICD-10-CM | POA: Insufficient documentation

## 2017-03-21 DIAGNOSIS — R05 Cough: Secondary | ICD-10-CM

## 2017-03-21 DIAGNOSIS — R059 Cough, unspecified: Secondary | ICD-10-CM

## 2017-03-21 LAB — CBC WITH DIFFERENTIAL/PLATELET
Basophils Absolute: 0.1 10*3/uL (ref 0.0–0.1)
Basophils Relative: 0.8 % (ref 0.0–3.0)
Eosinophils Absolute: 0.3 10*3/uL (ref 0.0–0.7)
Eosinophils Relative: 2.7 % (ref 0.0–5.0)
HCT: 41.4 % (ref 36.0–46.0)
Hemoglobin: 13.7 g/dL (ref 12.0–15.0)
Lymphocytes Relative: 11.1 % — ABNORMAL LOW (ref 12.0–46.0)
Lymphs Abs: 1.2 10*3/uL (ref 0.7–4.0)
MCHC: 33 g/dL (ref 30.0–36.0)
MCV: 87.9 fl (ref 78.0–100.0)
Monocytes Absolute: 0.7 10*3/uL (ref 0.1–1.0)
Monocytes Relative: 6.3 % (ref 3.0–12.0)
Neutro Abs: 8.8 10*3/uL — ABNORMAL HIGH (ref 1.4–7.7)
Neutrophils Relative %: 79.1 % — ABNORMAL HIGH (ref 43.0–77.0)
Platelets: 247 10*3/uL (ref 150.0–400.0)
RBC: 4.71 Mil/uL (ref 3.87–5.11)
RDW: 13.7 % (ref 11.5–15.5)
WBC: 11.2 10*3/uL — ABNORMAL HIGH (ref 4.0–10.5)

## 2017-03-21 LAB — NITRIC OXIDE: Nitric Oxide: 13

## 2017-03-21 MED ORDER — PANTOPRAZOLE SODIUM 40 MG PO TBEC: 40.0000 mg | DELAYED_RELEASE_TABLET | Freq: Every day | ORAL | 2 refills | Status: DC

## 2017-03-21 MED ORDER — PREDNISONE 10 MG PO TABS: ORAL_TABLET | ORAL | 0 refills | Status: DC

## 2017-03-21 MED ORDER — FAMOTIDINE 20 MG PO TABS: ORAL_TABLET | ORAL | 2 refills | Status: DC

## 2017-03-21 MED ORDER — CEFDINIR 300 MG PO CAPS: 300.0000 mg | ORAL_CAPSULE | Freq: Two times a day (BID) | ORAL | 0 refills | Status: DC

## 2017-03-21 NOTE — Patient Instructions (Addendum)
The key to effective treatment for your cough is eliminating the non-stop cycle of cough you're stuck in long enough to let your airway heal completely and then see if there is anything still making you cough once you stop the cough suppression, but this should take no more than 5 days to figure out  First take mucinex dm up every 2 every  12 hours and supplement if needed with  tramadol 50 mg up to 1-2 every 4 hours to suppress the urge to cough at all or even clear your throat. Swallowing water or using ice chips/non mint and menthol containing candies (such as lifesavers or sugarless jolly ranchers) are also effective.  You should rest your voice and avoid activities that you know make you cough.  Once you have eliminated the cough for 3 straight days try reducing the tramadol first,  then the mucinex dm as tolerated.    Prednisone 10 mg take  4 each am x 2 days,   2 each am x 2 days,  1 each am x 2 days and stop (this is to eliminate allergies and inflammation from coughing)  Protonix (pantoprazole) Take 30-60 min before first meal of the day and Pepcid 20 mg one bedtime plus chlorpheniramine 4 mg x 2 at bedtime (both available over the counter)  until cough is completely gone for at least a week without the need for cough suppression  GERD (REFLUX)  is an extremely common cause of respiratory symptoms, many times with no significant heartburn at all.    It can be treated with medication, but also with lifestyle changes including avoidance of late meals, excessive alcohol, smoking cessation, and avoid fatty foods, chocolate, peppermint, colas, red wine, and acidic juices such as orange juice.  NO MINT OR MENTHOL PRODUCTS SO NO COUGH DROPS   USE HARD CANDY INSTEAD (jolley ranchers or Stover's or Lifesavers (all available in sugarless versions) NO OIL BASED VITAMINS - use powdered substitutes.  Please see patient coordinator before you leave today  to schedule sinus CT  Please remember to go to  the lab and x-ray department downstairs in the basement  for your tests - we will call you with the results when they are available.     Please schedule a follow up office visit in 4 weeks, sooner if needed  with all medications /inhalers/ solutions in hand so we can verify exactly what you are taking. This includes all medications from all doctors and over the counters

## 2017-03-21 NOTE — Progress Notes (Signed)
Subjective:     Patient ID: Noel ChristmasSusan Y Hemenway, female   DOB: 05/20/1950,     MRN: [id number]  HPI  [age] yowf  Never smoker arrived in [place] in 1969 for college with seasonal rhinitis worse in fall > spring  > allergy eval in 30's and took shots several years, seemed to help  And subsequently had one bad bout of cough around 2008  x several weeks req tussionex and rarely since  Then Nov 5th 2018 felt like coming down with head cold > very productive green mucus  Assoc with simimlar  nasal discharge > zpak end of Nov 2018 no better > [place] c/w sinus infection > augmentin > traveled to [place] by jet, felt fine while there  for a period of a few weeks but developed a uti while there rx macarobid > after returned to [place] x 2 days cough  flared again > took ceftin > some better and  cleared mucus but still coughing  so referred to pulmonary clinic 03/21/2017 by Horton MarshallAnna Becker / Robert Reade's PA     03/21/2017 1st [facility] office visit/ Guy Seese   Chief Complaint  Patient presents with  . Pulmonary Consult    Referred by Horton MarshallAnna Becker, PA.  Pt states that she has been coughing off and on since Nov 2018.  She states she is coughing up green sputum. Cough seems worse first thing in the am and at bedtime. She states that she feels very fatigued and this is not lilke her.   acute onset cough assoc with doe but sleeps fine p tussionex at 30 degrees or in chair, can't lie flat s cough worse  Cough to point of gag/ urination no vomit  Sputum is minimally green now but most productive in am's Also noted mild doe = MMRC1 = can walk nl pace, flat grade, can't hurry or go uphills or steps s sob  But mostly just sob when coughing fits occur   No obvious day to day or daytime variability or assoc  mucus plugs or hemoptysis or cp or chest tightness, subjective wheeze or overt sinus or hb symptoms. No unusual exposure hx or h/o childhood pna/ asthma or knowledge of premature birth.  Sleeping ok as above  without nocturnal  exacerbation  of respiratory  c/o's or need for noct saba. Also denies any obvious fluctuation of symptoms with weather or environmental changes or other aggravating or alleviating factors except as outlined above   Current Allergies, Complete Past Medical History, Past Surgical History, Family History, and Social History were reviewed in [facility] electronic medical record.  ROS  The following are not active complaints unless bolded Hoarseness, sore throat, dysphagia, dental problems, itching, sneezing,  nasal congestion or discharge of excess mucus or purulent secretions, ear ache,   fever, chills, sweats, unintended wt loss or wt gain, classically pleuritic or exertional cp,  orthopnea pnd or leg swelling, presyncope, palpitations, abdominal pain, anorexia, nausea, vomiting, diarrhea  or change in bowel habits or change in bladder habits, change in stools or change in urine, dysuria, hematuria,  rash, arthralgias, visual complaints, headache, numbness, weakness or ataxia or problems with walking or coordination,  change in mood/affect or memory.        Current Meds  Medication Sig  . acetaminophen (TYLENOL) 325 MG tablet Take 650 mg by mouth every 6 (six) hours as needed.  . cetirizine (ZYRTEC) 10 MG tablet Take 10 mg by mouth daily.  . chlorpheniramine-HYDROcodone (TUSSIONEX PENNKINETIC ER) 10-8  MG/5ML SUER Take 5 mLs by mouth every 12 (twelve) hours as needed for cough.  . clopidogrel (PLAVIX) 75 MG tablet Take 1 tablet (75 mg total) by mouth daily.  Marland Kitchen dexlansoprazole (DEXILANT) 60 MG capsule Take 60 mg by mouth daily.  . Dulaglutide (TRULICITY) 1.5 MG/0.5ML SOPN Inject into the skin as directed.  Marland Kitchen EPINEPHrine 0.3 mg/0.3 mL IJ SOAJ injection Inject into the muscle once.  Marland Kitchen losartan (COZAAR) 50 MG tablet Take 50 mg by mouth daily.  . metFORMIN (GLUCOPHAGE) 500 MG tablet Take 500 mg by mouth 2 (two) times daily with a meal.  . potassium chloride SA (K-DUR,KLOR-CON) 20  MEQ tablet Take 20 mEq by mouth 2 (two) times daily.  . rosuvastatin (CRESTOR) 5 MG tablet Take 5 mg by mouth every morning.  . traMADol (ULTRAM) 50 MG tablet Take 1 tablet (50 mg total) by mouth 3 (three) times daily. (Patient taking differently: Take 50 mg by mouth every 6 (six) hours as needed. )  . Triamcinolone Acetonide (NASACORT AQ NA) Place 2 sprays into the nose daily.  Marland Kitchen zolpidem (AMBIEN) 10 MG tablet Take 10 mg by mouth at bedtime. TAKING 1/2 TABLET AT BEDTIME           Review of Systems     Objective:   Physical Exam   amb slt hoarse wf nad   Wt Readings from Last 3 Encounters:  03/21/17 202 lb 9.6 oz (91.9 kg)  03/26/16 208 lb (94.3 kg)  10/06/15 208 lb 12.8 oz (94.7 kg)     Vital signs reviewed - Note on arrival 02 sats  95% on RA      HEENT: nl dentition, , and oropharynx with mp secretions . Nl external ear canals without cough reflex - moderate bilateral non-specific turbinate edema  With mp secretions    NECK :  without JVD/Nodes/TM/ nl carotid upstrokes bilaterally   LUNGS: no acc muscle use,  Nl contour chest which is clear to A and P bilaterally without cough on mid  exp maneuvers   CV:  RRR  no s3 or murmur or increase in P2, and no edema   ABD:  soft and nontender with nl inspiratory excursion in the supine position. No bruits or organomegaly appreciated, bowel sounds nl  MS:  Nl gait/ ext warm without deformities, calf tenderness, cyanosis or clubbing No obvious joint restrictions   SKIN: warm and dry without lesions    NEURO:  alert, approp, nl sensorium with  no motor or cerebellar deficits apparent.    Labs ordered 03/21/2017    Allergy screening   CXR PA and Lateral:   03/21/2017 :    I personally reviewed images and agree with radiology impression as follows:    No active cardiopulmonary disease.     Assessment:

## 2017-03-22 ENCOUNTER — Encounter: Payer: Self-pay | Admitting: Internal Medicine

## 2017-03-22 LAB — RESPIRATORY ALLERGY PROFILE REGION II ~~LOC~~
Allergen, A. alternata, m6: <0.1 kU/L
Allergen, Cedar tree, t12: 2.49 kU/L — ABNORMAL HIGH
Allergen, Comm Silver Birch, t9: 4.93 kU/L — ABNORMAL HIGH
Allergen, Cottonwood, t14: 0.69 kU/L — ABNORMAL HIGH
Allergen, D pternoyssinus,d7: 4.04 kU/L — ABNORMAL HIGH
Allergen, Mouse Urine Protein, e78: <0.1 kU/L
Allergen, Mulberry, t76: <0.1 kU/L
Allergen, Oak,t7: 4.43 kU/L — ABNORMAL HIGH
Allergen, P. notatum, m1: <0.1 kU/L
Aspergillus fumigatus, m3: <0.1 kU/L
Bermuda Grass: 1.15 kU/L — ABNORMAL HIGH
Box Elder IgE: 1.37 kU/L — ABNORMAL HIGH
CLADOSPORIUM HERBARUM (M2) IGE: <0.1 kU/L
COMMON RAGWEED (SHORT) (W1) IGE: 11.8 kU/L — ABNORMAL HIGH
Cat Dander: 0.46 kU/L — ABNORMAL HIGH
Class: 0
Class: 0
Class: 0
Class: 0
Class: 0
Class: 0
Class: 0
Class: 0
Class: 1
Class: 1
Class: 1
Class: 2
Class: 2
Class: 2
Class: 2
Class: 2
Class: 2
Class: 2
Class: 3
Class: 3
Class: 3
Class: 3
Class: 3
Class: 3
Cockroach: <0.1 kU/L
D. farinae: 4.14 kU/L — ABNORMAL HIGH
Dog Dander: 0.19 kU/L — ABNORMAL HIGH
Elm IgE: 1.09 kU/L — ABNORMAL HIGH
IgE (Immunoglobulin E), Serum: 122 kU/L — ABNORMAL HIGH (ref <=114)
Johnson Grass: 0.98 kU/L — ABNORMAL HIGH
Pecan/Hickory Tree IgE: 0.88 kU/L — ABNORMAL HIGH
Rough Pigweed  IgE: 0.56 kU/L — ABNORMAL HIGH
Sheep Sorrel IgE: 0.82 kU/L — ABNORMAL HIGH
Timothy Grass: 4.54 kU/L — ABNORMAL HIGH

## 2017-03-22 LAB — INTERPRETATION:

## 2017-03-22 NOTE — Assessment & Plan Note (Addendum)
Onset Dec 10 2016 with head cold / sinusitis - FENO 03/21/2017  =   13 - Allergy profile 03/21/2017 >  Eos 0.4 /  IgE  Pending  - Sinus CT ordered     The most common causes of chronic cough in immunocompetent adults include the following: upper airway cough syndrome (UACS), previously referred to as postnasal drip syndrome (PNDS), which is caused by variety of rhinosinus conditions; (2) asthma; (3) GERD; (4) chronic bronchitis from cigarette smoking or other inhaled environmental irritants; (5) nonasthmatic eosinophilic bronchitis; and (6) bronchiectasis.   These conditions, singly or in combination, have accounted for up to 94% of the causes of chronic cough in prospective studies.   Other conditions have constituted no >6% of the causes in prospective studies These have included bronchogenic carcinoma, chronic interstitial pneumonia, sarcoidosis, left ventricular failure, ACEI-induced cough, and aspiration from a condition associated with pharyngeal dysfunction.    Chronic cough is often simultaneously caused by more than one condition. A single cause has been found from 38 to 82% of the time, multiple causes from 18 to 62%. Multiply caused cough has been the result of three diseases up to 42% of the time.        Most likely based on hx and exam and very low FENO this is not any form of asthma but rather Upper airway cough syndrome (previously labeled PNDS),  is so named because it's frequently impossible to sort out how much is  CR/sinusitis with freq throat clearing (which can be related to primary GERD)   vs  causing  secondary (" extra esophageal")  GERD from wide swings in gastric pressure that occur with throat clearing, often  promoting self use of mint and menthol lozenges that reduce the lower esophageal sphincter tone and exacerbate the problem further in a cyclical fashion.   These are the same pts (now being labeled as having "irritable larynx syndrome" by some cough centers) who not  infrequently have a history of having failed to tolerate ace inhibitors,  dry powder inhalers or biphosphonates or report having atypical/extraesophageal reflux symptoms that don't respond to standard doses of PPI  and are easily confused as having aecopd or asthma flares by even experienced allergists/ pulmonologists (myself included).    Of the three most common causes of  Sub-acute or recurrent or chronic cough, only one (GERD)  can actually contribute to/ trigger  the other two (asthma and post nasal drip syndrome)  and perpetuate the cylce of cough.  While not intuitively obvious, many patients with chronic low grade reflux do not cough until there is a primary insult that disturbs the protective epithelial barrier and exposes sensitive nerve endings.   This is typically viral but can be direct irritation from PNDS or physical injury such as with an endotracheal tube.   The point is that once this occurs, it is difficult to eliminate the cycle  using anything but a maximally effective acid suppression regimen at least in the short run, accompanied by an appropriate diet to address non acid GERD and control / eliminate the cough itself for at least 3 days.    rec cyclical cough regimen (see avs) and another 10 d course of omnicef while awaiting results of ct sinus    Total time devoted to counseling  > 50 % of initial 60 min office visit:  review case with pt/ discussion of options/alternatives/ personally creating written customized instructions  in presence of pt  then going over those specific  Instructions directly with the pt including how to use all of the meds but in particular covering each new medication in detail and the difference between the maintenance= "automatic" meds and the prns using an action plan format for the latter (If this problem/symptom => do that organization reading Left to right).  Please see AVS from this visit for a full list of these instructions which I personally  wrote for this pt and  are unique to this visit.       Marland Kitchen

## 2017-03-22 NOTE — Progress Notes (Signed)
LMTCB

## 2017-03-25 NOTE — Progress Notes (Signed)
Was able to talk to patient regarding her results.  She verbalized an understanding of what was discussed. No further questions at this time. 

## 2017-03-25 NOTE — Progress Notes (Signed)
Was able to talk to patient regarding her results.  She verbalized an understanding what was discussed. No further questions at this time.

## 2017-04-02 ENCOUNTER — Ambulatory Visit (INDEPENDENT_AMBULATORY_CARE_PROVIDER_SITE_OTHER)
Admission: RE | Admit: 2017-04-02 | Discharge: 2017-04-02 | Disposition: A | Discharge status: Home / Self Care | Payer: Commercial Managed Care - PPO | Source: Ambulatory Visit | Attending: Internal Medicine | Admitting: Internal Medicine

## 2017-04-02 DIAGNOSIS — J45991 Cough variant asthma: Secondary | ICD-10-CM

## 2017-04-02 DIAGNOSIS — R05 Cough: Secondary | ICD-10-CM

## 2017-04-02 DIAGNOSIS — R059 Cough, unspecified: Secondary | ICD-10-CM

## 2017-04-03 ENCOUNTER — Telehealth: Payer: Self-pay | Admitting: Internal Medicine

## 2017-04-03 NOTE — Progress Notes (Signed)
LMTCB on preferred phone number listed for patient. 

## 2017-04-03 NOTE — Telephone Encounter (Signed)
Notes recorded by Pilar Grammeseynolds, Lauren C, RN on 04/03/2017 at 10:05 AM EST LMTCB on preferred phone number listed for patient.  ------  Notes recorded by Nyoka CowdenWert, Michael B, MD on 04/03/2017 at 7:12 AM EST Call patient : Study is unremarkable, no change in recs - .Be sure patient has f/u ov so we can go over all the details of this study and get a plan together moving forward - ok to move up f/u if not feeling better and wants to be seen sooner  Spoke with patient. She is aware of results. She stated that the goal was for her was to try and stop taking the tramadol and Mucinex. Patient is not able to do this and is about to run out of tramadol. Her original follow up was scheduled for late March but she wanted to see MW sooner.   She has been scheduled for 04/05/17 at 1130.   Nothing else needed at time of call.

## 2017-04-05 ENCOUNTER — Ambulatory Visit (INDEPENDENT_AMBULATORY_CARE_PROVIDER_SITE_OTHER): Payer: Commercial Managed Care - PPO | Admitting: Internal Medicine

## 2017-04-05 ENCOUNTER — Encounter: Payer: Self-pay | Admitting: Internal Medicine

## 2017-04-05 VITALS — BP 124/74 | HR 80 | Ht 62.75 in | Wt 202.8 lb

## 2017-04-05 DIAGNOSIS — J45991 Cough variant asthma: Secondary | ICD-10-CM | POA: Diagnosis not present

## 2017-04-05 MED ORDER — TRAMADOL HCL 50 MG PO TABS: 50.0000 mg | ORAL_TABLET | ORAL | 1 refills | Status: DC | PRN

## 2017-04-05 MED ORDER — MONTELUKAST SODIUM 10 MG PO TABS: ORAL_TABLET | ORAL | 2 refills | Status: DC

## 2017-04-05 NOTE — Progress Notes (Signed)
Subjective:     Patient ID: Christina Wong, female   DOB: [birth year],     MRN: [id number]    Brief patient profile:  3 yowf  Never smoker arrived in [place] in 1969 for college with seasonal rhinitis worse in fall > spring  > allergy eval in 30's and took shots several years, seemed to help  And subsequently had one bad bout of cough around 2008  x several weeks req tussionex and rarely since  Then Nov 5th 2018 felt like coming down with head cold > very productive green mucus  Assoc with simimlar  nasal discharge > zpak end of Nov 2018 no better > [place] c/w sinus infection > augmentin > traveled to [place] by jet, felt fine while there  for a period of a few weeks with no tussionex  but developed a uti while there rx macarobid > after returned to [place] x 2 days cough  flared again > took ceftin > some better and  cleared mucus but still coughing  so referred to pulmonary clinic 03/21/2017 by Horton Marshall / Molly Maduro Reade's PA    History of Present Illness  03/21/2017 1st [facility] office visit/ Jermani Pund   Chief Complaint  Patient presents with  . Pulmonary Consult    Referred by Horton Marshall, PA.  Pt states that she has been coughing off and on since Nov 2018.  She states she is coughing up green sputum. Cough seems worse first thing in the am and at bedtime. She states that she feels very fatigued and this is not lilke her.   acute onset cough assoc with doe but sleeps fine p tussionex at 30 degrees or in chair, can't lie flat s cough worse  Cough to point of gag/ urination no vomit  Sputum is minimally green now but most productive in am's Also noted mild doe = MMRC1 = can walk nl pace, flat grade, can't hurry or go uphills or steps s sob  But mostly just sob when coughing fits occur  rec  First take mucinex dm up every 2 every  12 hours and supplement if needed with  tramadol 50 mg up to 1-2 every 4 hours to suppress the urge to cough at all or even clear your throat.  Prednisone 10  mg take  4 each am x 2 days,   2 each am x 2 days,  1 each am x 2 days and stop (this is to eliminate allergies and inflammation from coughing) Protonix (pantoprazole) Take 30-60 min before first meal of the day and Pepcid 20 mg one bedtime plus chlorpheniramine 4 mg x 2 at bedtime (both available over the counter)  until cough is completely gone for at least a week without the need for cough suppression GERD diet  Please see patient coordinator before you leave today  to schedule sinus CT   04/05/2017  f/u ov/Danil Wedge re:  Refractory cough x nov 2018  Chief Complaint  Patient presents with  . Follow-up    Cough has improved some. She is still producing sputum- clear . She is feeling less fatigued.    Dyspnea:  Resolved  Cough: never got 100% control x while in [place] / cough worse around 9pm and then sleep better p 2 chlorpheniramine s am flare Sleep: 30 degrees  SABA use:  None  Constant sense of globus while awake / never able to eliminate even with tramadol but never used more than 1 (instructions say to use up to  2)   No obvious day to day or daytime variability or assoc truly excess/ purulent sputum or mucus plugs or hemoptysis or cp or chest tightness, subjective wheeze or overt sinus or hb symptoms. No unusual exposure hx or h/o childhood pna/ asthma or knowledge of premature birth.  Sleeping ok flat without nocturnal  or early am exacerbation  of respiratory  c/o's or need for noct saba. Also denies any obvious fluctuation of symptoms with weather or environmental changes or other aggravating or alleviating factors except as outlined above   Current Allergies, Complete Past Medical History, Past Surgical History, Family History, and Social History were reviewed in [facility] electronic medical record.  ROS  The following are not active complaints unless bolded Hoarseness, sore throat, dysphagia, dental problems, itching, sneezing,  nasal congestion or discharge of excess mucus or  purulent secretions, ear ache,   fever, chills, sweats, unintended wt loss or wt gain, classically pleuritic or exertional cp,  orthopnea pnd or leg swelling, presyncope, palpitations, abdominal pain, anorexia, nausea, vomiting, diarrhea  or change in bowel habits or change in bladder habits, change in stools or change in urine, dysuria, hematuria,  rash, arthralgias, visual complaints, headache, numbness, weakness or ataxia or problems with walking or coordination,  change in mood/affect or memory.        Current Meds  Medication Sig  . acetaminophen (TYLENOL) 325 MG tablet Take 650 mg by mouth every 6 (six) hours as needed.  . cetirizine (ZYRTEC) 10 MG tablet Take 10 mg by mouth daily.  . chlorpheniramine-HYDROcodone (TUSSIONEX PENNKINETIC ER) 10-8 MG/5ML SUER Take 5 mLs by mouth every 12 (twelve) hours as needed for cough.  . clopidogrel (PLAVIX) 75 MG tablet Take 1 tablet (75 mg total) by mouth daily.  Marland Kitchen. dexlansoprazole (DEXILANT) 60 MG capsule Take 60 mg by mouth daily.  . Dulaglutide (TRULICITY) 1.5 MG/0.5ML SOPN Inject into the skin as directed.  Marland Kitchen. EPINEPHrine 0.3 mg/0.3 mL IJ SOAJ injection Inject into the muscle once.  . famotidine (PEPCID) 20 MG tablet One at bedtime  . losartan (COZAAR) 50 MG tablet Take 50 mg by mouth daily.  . metFORMIN (GLUCOPHAGE) 500 MG tablet Take 500 mg by mouth 2 (two) times daily with a meal.  . pantoprazole (PROTONIX) 40 MG tablet Take 1 tablet (40 mg total) by mouth daily. Take 30-60 min before first meal of the day  . potassium chloride SA (K-DUR,KLOR-CON) 20 MEQ tablet Take 20 mEq by mouth 2 (two) times daily.  . rosuvastatin (CRESTOR) 5 MG tablet Take 5 mg by mouth every morning.  . traMADol (ULTRAM) 50 MG tablet Take 1 tablet (50 mg total) by mouth every 4 (four) hours as needed.  . Triamcinolone Acetonide (NASACORT AQ NA) Place 2 sprays into the nose daily.  Marland Kitchen. zolpidem (AMBIEN) 10 MG tablet Take 10 mg by mouth at bedtime. TAKING 1/2 TABLET AT BEDTIME   . [DISCONTINUED] traMADol (ULTRAM) 50 MG tablet Take 1 tablet (50 mg total) by mouth 3 (three) times daily. (Patient taking differently: Take 50 mg by mouth every 6 (six) hours as needed. )                Objective:   Physical Exam     amb wf nad / freq throat clearing    04/05/2017         202   03/21/17 202 lb 9.6 oz (91.9 kg)  03/26/16 208 lb (94.3 kg)  10/06/15 208 lb 12.8 oz (94.7 kg)  Vital signs reviewed - Note on arrival 02 sats  95% on RA       HEENT: nl dentition, turbinates bilaterally, and oropharynx. Nl external ear canals without cough reflex   NECK :  without JVD/Nodes/TM/ nl carotid upstrokes bilaterally   LUNGS: no acc muscle use,  Nl contour chest which is clear to A and P bilaterally without cough on insp or exp maneuvers   CV:  RRR  no s3 or murmur or increase in P2, and no edema   ABD:  soft and nontender with nl inspiratory excursion in the supine position. No bruits or organomegaly appreciated, bowel sounds nl  MS:  Nl gait/ ext warm without deformities, calf tenderness, cyanosis or clubbing No obvious joint restrictions   SKIN: warm and dry without lesions    NEURO:  alert, approp, nl sensorium with  no motor or cerebellar deficits apparent.         Assessment:

## 2017-04-05 NOTE — Patient Instructions (Addendum)
For drainage / throat tickle try take CHLORPHENIRAMINE  4 mg - take one every 4 hours as needed - available over the counter- may cause drowsiness so start with just a bedtime dose or two and see how you tolerate it before trying in daytime    Change pepcid 20 mg after supper   Add singulair 10 mg one daily    Take delsym two tsp every 12 hours and supplement if needed with  tramadol 50 mg up to 2 every 4 hours to suppress the urge to cough. Swallowing water or using ice chips/non mint and menthol containing candies (such as lifesavers or sugarless jolly ranchers) are also effective.  You should rest your voice and avoid activities that you know make you cough.  Once you have eliminated the cough for 3 straight days try reducing the tramadol first,  then the delsym as tolerated.     See Tammy NP w/in 2 weeks with all your medications, even over the counter meds, separated in two separate bags, the ones you take no matter what vs the ones you stop once you feel better and take only as needed when you feel you need them.   Tammy  will generate for you a new user friendly medication calendar that will put us all on the same page re: your medication use.     Without this process, it simply isn't possible to assure that we are providing  your outpatient care  with  the attention to detail we feel you deserve.   If we cannot assure that you're getting that kind of care,  then we cannot manage your problem effectively from this clinic.  Once you have seen Tammy and we are sure that we're all on the same page with your medication use she will arrange follow up with me. If not better consider trial off ARB and  do methacholine to be complete then return for gabapentin trial if stil not better

## 2017-04-06 ENCOUNTER — Encounter: Payer: Self-pay | Admitting: Internal Medicine

## 2017-04-06 NOTE — Assessment & Plan Note (Addendum)
Onset Dec 10 2016 with head cold / sinusitis - Allergy profile 03/21/2017 >  Eos 0.4 /  IgE  122 RAST pos ragweed dust grass trees cat > dog  - FENO 03/21/2017  =   13 - Sinus CT 03/22/2017 > Clear sinuses. - Max rx for cyclical cough 03/21/2017   - trial of singulair 04/06/2017    Absence of noct or early am cough/wheeze or excess am mucus strongly against asthma and in favor of Upper airway cough syndrome (previously labeled PNDS),  is so named because it's frequently impossible to sort out how much is  CR/sinusitis with freq throat clearing (which can be related to primary GERD)   vs  causing  secondary (" extra esophageal")  GERD from wide swings in gastric pressure that occur with throat clearing, often  promoting self use of mint and menthol lozenges that reduce the lower esophageal sphincter tone and exacerbate the problem further in a cyclical fashion.   These are the same pts (now being labeled as having "irritable larynx syndrome" by some cough centers) who not infrequently have a history of having failed to tolerate ace inhibitors,  dry powder inhalers or biphosphonates or report having atypical/extraesophageal reflux symptoms that don't respond to standard doses of PPI  and are easily confused as having aecopd or asthma flares by even experienced allergists/ pulmonologists (myself included).   ? If this is due to allergic rhinitis vs irritable larynx so rec   Repeat cyclical cough regimen correctly this time Add singulair Return with all meds in hand using a trust but verify approach to confirm accurate Medication  Reconciliation The principal here is that until we are certain that the  patients are doing what we've asked, it makes no sense to ask them to do more.     To keep things simple, I have asked the patient to first separate medicines that are perceived as maintenance, that is to be taken daily "no matter what", from those medicines that are taken on only on an as-needed basis and I  have given the patient examples of both, and then return to see our NP to generate a  detailed  medication calendar which should be followed until the next physician sees the patient and updates it.     If not better consider trial off ARB and  do methacholine to be complete then return for gabapentin trial if not better

## 2017-04-30 ENCOUNTER — Ambulatory Visit (INDEPENDENT_AMBULATORY_CARE_PROVIDER_SITE_OTHER): Payer: Commercial Managed Care - PPO | Admitting: Adult Health

## 2017-04-30 ENCOUNTER — Encounter: Payer: Self-pay | Admitting: Adult Health

## 2017-04-30 ENCOUNTER — Ambulatory Visit: Payer: Commercial Managed Care - PPO | Admitting: Internal Medicine

## 2017-04-30 DIAGNOSIS — J45991 Cough variant asthma: Secondary | ICD-10-CM

## 2017-04-30 MED ORDER — MONTELUKAST SODIUM 10 MG PO TABS: ORAL_TABLET | ORAL | 6 refills | Status: DC

## 2017-04-30 NOTE — Progress Notes (Signed)
@Patient  ID: Christina Wong, female    DOB: 1950/09/07, 67 y.o.   MRN: [id number]  Chief Complaint  Patient presents with  . Follow-up    cough     Referring provider: [name], MD  HPI: 67 yo female never smoker seen for pulmonary consult 03/21/17 for chronic cough since 12/2016   TEST  Allergy profile 03/21/2017 >  Eos 0.4 /  IgE  122 RAST pos ragweed dust grass trees cat > dog  - FENO 03/21/2017  =   13 - Sinus CT 03/22/2017 > Clear sinuses.  04/30/2017 Follow up ; Chronic cough vs cough variant asthma  Patient returns for one-month follow-up.  Patient was seen last visit after persistent coughing since November 2018.  She initially developed a acute upper restaurant infection\bronchitis comp located by sinusitis.  She had ongoing cough and was placed on an aggressive cough regimen including Zyrtec, Nasacort, Singulair and GERD prevention with Protonix and Pepcid.  She also also given Ultram for cough control.  She had previously taken Tussionex with help as well.  Patient says that she recently went on vacation to [place] and cough resolved while gone.  Cough is not returned.  She occasionally has mild dry cough and throat clearing but is much improved She was not sure whether to take Singulair and Zyrtec together.  He has not had to take Tussionex or tramadol recently.  He denies any fever hemoptysis chest pain orthopnea PND or wheezing.  .   Allergies  Allergen Reactions  . Lipitor [Atorvastatin] Other (See Comments)    Leg cramps  . Onion Nausea And Vomiting  . Bactrim [Sulfamethoxazole-Trimethoprim] Other (See Comments)    Muscle issues  . Sulfa Antibiotics Other (See Comments)    unknown    Immunization History  Administered Date(s) Administered  . Influenza, High Dose Seasonal PF 11/05/2016  . Influenza-Unspecified 01/04/2016    Past Medical History:  Diagnosis Date  . Aphasia 07/11/13   x 10-15 min  . Elevated liver function tests    One of her studies  are slightly elevated which may represent fatty liver from her Diabetes and her obesity or may represent an adverse effect from the Crestor.  . Exogenous obesity    She has had a problem with  . Headache(784.0)    "daily to weekly lately" (09/02/2013)  . Hypercholesterolemia   . Hypertension    hx of essential- on meds  . Infertility   . Leg cramps    in last 6 months  . Neck stiffness 07/13/14   and neck pain  . TIA (transient ischemic attack) 09/01/2013   "that's what they think"  . Type II diabetes mellitus (HCC)    mild  . Walking pneumonia ~ 1972    Tobacco History: Social History   Tobacco Use  Smoking Status Never Smoker  Smokeless Tobacco Never Used   Counseling given: Not Answered   Outpatient Encounter Medications as of 04/30/2017  Medication Sig  . acetaminophen (TYLENOL) 325 MG tablet Take 650 mg by mouth every 6 (six) hours as needed.  . cetirizine (ZYRTEC) 10 MG tablet Take 10 mg by mouth daily.  . chlorpheniramine (CHLOR-TRIMETON) 4 MG tablet Take 4 mg by mouth 2 (two) times daily as needed for allergies.  Marland Kitchen clopidogrel (PLAVIX) 75 MG tablet Take 1 tablet (75 mg total) by mouth daily.  . Dulaglutide (TRULICITY) 1.5 MG/0.5ML SOPN Inject into the skin as directed.  Marland Kitchen EPINEPHrine 0.3 mg/0.3 mL IJ SOAJ injection  Inject into the muscle once.  . famotidine (PEPCID) 20 MG tablet One at bedtime  . losartan (COZAAR) 50 MG tablet Take 50 mg by mouth daily.  . metFORMIN (GLUCOPHAGE) 500 MG tablet Take 500 mg by mouth 2 (two) times daily with a meal.  . potassium chloride SA (K-DUR,KLOR-CON) 20 MEQ tablet Take 20 mEq by mouth 2 (two) times daily.  . rosuvastatin (CRESTOR) 5 MG tablet Take 5 mg by mouth every morning.  . traMADol (ULTRAM) 50 MG tablet Take 1 tablet (50 mg total) by mouth every 4 (four) hours as needed.  . Triamcinolone Acetonide (NASACORT AQ NA) Place 2 sprays into the nose daily.  Marland Kitchen. zolpidem (AMBIEN) 10 MG tablet Take 10 mg by mouth at bedtime. TAKING 1/2  TABLET AT BEDTIME  . montelukast (SINGULAIR) 10 MG tablet One at bedtime every night  . [DISCONTINUED] chlorpheniramine-HYDROcodone (TUSSIONEX PENNKINETIC ER) 10-8 MG/5ML SUER Take 5 mLs by mouth every 12 (twelve) hours as needed for cough.  . [DISCONTINUED] dexlansoprazole (DEXILANT) 60 MG capsule Take 60 mg by mouth daily.  . [DISCONTINUED] montelukast (SINGULAIR) 10 MG tablet One at bedtime every night (Patient not taking: Reported on 04/30/2017)  . [DISCONTINUED] pantoprazole (PROTONIX) 40 MG tablet Take 1 tablet (40 mg total) by mouth daily. Take 30-60 min before first meal of the day (Patient not taking: Reported on 04/30/2017)  . [DISCONTINUED] Saxagliptin HCl (ONGLYZA PO) Take 1,000 mg by mouth.     No facility-administered encounter medications on file as of 04/30/2017.      Review of Systems  Constitutional:   No  weight loss, night sweats,  Fevers, chills, fatigue, or  lassitude.  HEENT:   No headaches,  Difficulty swallowing,  Tooth/dental problems, or  Sore throat,                No sneezing, itching, ear ache,  +nasal congestion, post nasal drip,   CV:  No chest pain,  Orthopnea, PND, swelling in lower extremities, anasarca, dizziness, palpitations, syncope.   GI  No heartburn, indigestion, abdominal pain, nausea, vomiting, diarrhea, change in bowel habits, loss of appetite, bloody stools.   Resp: No shortness of breath with exertion or at rest.  No excess mucus, no productive cough,  No non-productive cough,  No coughing up of blood.  No change in color of mucus.  No wheezing.  No chest wall deformity  Skin: no rash or lesions.  GU: no dysuria, change in color of urine, no urgency or frequency.  No flank pain, no hematuria   MS:  No joint pain or swelling.  No decreased range of motion.  No back pain.    Physical Exam  BP 124/82 (BP Location: Left Arm, Cuff Size: Normal)   Pulse 87   Ht 5' 2.5" (1.588 m)   Wt 200 lb 9.6 oz (91 kg)   LMP 02/05/2006   SpO2 97%    BMI 36.11 kg/m   GEN: A/Ox3; pleasant , NAD, well nourished    HEENT:  [place]/AT,  EACs-clear, TMs-wnl, NOSE-clear drainage , THROAT-clear, no lesions, no postnasal drip or exudate noted.   NECK:  Supple w/ fair ROM; no JVD; normal carotid impulses w/o bruits; no thyromegaly or nodules palpated; no lymphadenopathy.    RESP  Clear  P & A; w/o, wheezes/ rales/ or rhonchi. no accessory muscle use, no dullness to percussion  CARD:  RRR, no m/r/g, no peripheral edema, pulses intact, no cyanosis or clubbing.  GI:   Soft & nt; nml  bowel sounds; no organomegaly or masses detected.   Musco: Warm bil, no deformities or joint swelling noted.   Neuro: alert, no focal deficits noted.    Skin: Warm, no lesions or rashes    Lab Results:  CBC   No results found for: BNP  ProBNP No results found for: PROBNP  Imaging: Ct Maxillofacial Ltd Wo Cm  Result Date: 04/03/2017 CLINICAL DATA:  Chronic cough for 3.5 months. EXAM: CT PARANASAL SINUS LIMITED WITHOUT CONTRAST TECHNIQUE: Non-contiguous multidetector CT images of the paranasal sinuses were obtained in a single plane without contrast. COMPARISON:  Brain MRI 09/02/2013 FINDINGS: The visualized portions of the paranasal sinuses are clear without evidence of significant mucosal thickening or fluid. There is slight leftward nasal septal deviation. The regional soft tissues are grossly unremarkable. IMPRESSION: Clear sinuses. Electronically Signed   By: Sebastian Ache M.D.   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  Treatment aimed at rhinitis and GERD prevention. We will now start to downsize treatment regimen.  Plan  Patient Instructions  Restart Singulair daily.  Continue on Zyrtec at bedtime Continue on Pepcid 20 mg at bedtime for 2 weeks then as needed for reflux or flare of cough Delsym 2 teaspoons twice daily as needed for  cough Saline nasal spray\rinses twice daily Continue on Nasacort daily. May stop Protonix Follow up As needed            Rubye Oaks, NP 04/30/2017

## 2017-04-30 NOTE — Progress Notes (Signed)
67 y/o F with PMH of Allergies.    OV on 04/05/2017  with Dr. Sherene SiresWert re: Refractory productive cough (green sputum, worse at night) x nov 2018.  Presents today for 3 weeks follow-up for the cough, states since OV on 04/05/17, she has been doing well.  Cough has resolved for over a week.  States she was on vacation in [place] and while there the cough went away one week into her vacation.  Pt has been taking the singular as prescribed for allergies, but stopped taking the Zyrtec.  No longer on any cough medications.  No longer on Pepcid or protonix and has had no heart burn.    PE GEN: Pleasant, in no distress, normal affect ENT: no lesions, post nasal drip noted, nasal mucosa pale and boggy Neck: No JVD, supple CVD: Rhythm regular, no peripheral edema Lungs: No use of accessory muscle, CTA Abd: Soft and non-tender, normal BS Neuro: Alert and oriented x3  I/P Cough Resolved Continue on Pepcid 20 mg at bedtime for 2 weeks then as needed for reflux or flare of cough May stop Protonix Delsym 2 teaspoons twice daily as needed for cough Follow-up as needed  Allergies Ongoing Continue Singulair daily  Continue on Zyrtec at bedtime Saline nasal spray\rinses twice daily Continue on Nasacort daily Continue to take Chlorpheniramine 4 mg by mouth every 4 hours as needed

## 2017-04-30 NOTE — Patient Instructions (Signed)
Restart Singulair daily.  Continue on Zyrtec at bedtime Continue on Pepcid 20 mg at bedtime for 2 weeks then as needed for reflux or flare of cough Delsym 2 teaspoons twice daily as needed for cough Saline nasal spray\rinses twice daily Continue on Nasacort daily. May stop Protonix Follow up As needed

## 2017-04-30 NOTE — Assessment & Plan Note (Signed)
Improved control with nearly resolved upper airway cough.  Treatment aimed at rhinitis and GERD prevention. We will now start to downsize treatment regimen.  Plan  Patient Instructions  Restart Singulair daily.  Continue on Zyrtec at bedtime Continue on Pepcid 20 mg at bedtime for 2 weeks then as needed for reflux or flare of cough Delsym 2 teaspoons twice daily as needed for cough Saline nasal spray\rinses twice daily Continue on Nasacort daily. May stop Protonix Follow up As needed

## 2017-05-01 NOTE — Progress Notes (Signed)
Chart and office note reviewed in detail  > agree with a/p as outlined    

## 2017-05-07 ENCOUNTER — Ambulatory Visit: Payer: Commercial Managed Care - PPO | Admitting: Obstetrics & Gynecology

## 2017-05-07 ENCOUNTER — Encounter: Payer: Self-pay | Admitting: Obstetrics & Gynecology

## 2018-03-10 ENCOUNTER — Other Ambulatory Visit: Payer: Self-pay | Admitting: Family Medicine

## 2018-03-10 ENCOUNTER — Ambulatory Visit
Admission: RE | Admit: 2018-03-10 | Discharge: 2018-03-10 | Disposition: A | Discharge status: Home / Self Care | Payer: Commercial Managed Care - PPO | Source: Ambulatory Visit | Attending: Family Medicine | Admitting: Family Medicine

## 2018-03-10 DIAGNOSIS — J40 Bronchitis, not specified as acute or chronic: Secondary | ICD-10-CM

## 2018-04-16 ENCOUNTER — Ambulatory Visit: Payer: Commercial Managed Care - PPO | Admitting: Allergy and Immunology

## 2018-04-18 ENCOUNTER — Other Ambulatory Visit: Payer: Self-pay | Admitting: Obstetrics & Gynecology

## 2018-04-18 DIAGNOSIS — Z1231 Encounter for screening mammogram for malignant neoplasm of breast: Secondary | ICD-10-CM

## 2018-04-23 ENCOUNTER — Ambulatory Visit (INDEPENDENT_AMBULATORY_CARE_PROVIDER_SITE_OTHER): Payer: Commercial Managed Care - PPO | Admitting: Allergy and Immunology

## 2018-04-23 ENCOUNTER — Encounter: Payer: Self-pay | Admitting: Allergy and Immunology

## 2018-04-23 ENCOUNTER — Other Ambulatory Visit: Payer: Self-pay

## 2018-04-23 VITALS — BP 140/96 | HR 67 | Temp 98.5°F | Resp 18 | Ht 62.4 in | Wt 201.0 lb

## 2018-04-23 DIAGNOSIS — J3089 Other allergic rhinitis: Secondary | ICD-10-CM | POA: Diagnosis not present

## 2018-04-23 DIAGNOSIS — D721 Eosinophilia, unspecified: Secondary | ICD-10-CM

## 2018-04-23 DIAGNOSIS — K219 Gastro-esophageal reflux disease without esophagitis: Secondary | ICD-10-CM | POA: Diagnosis not present

## 2018-04-23 MED ORDER — FAMOTIDINE 40 MG PO TABS: 40.0000 mg | ORAL_TABLET | Freq: Every day | ORAL | 5 refills | Status: DC

## 2018-04-23 MED ORDER — PANTOPRAZOLE SODIUM 40 MG PO TBEC: 40.0000 mg | DELAYED_RELEASE_TABLET | Freq: Two times a day (BID) | ORAL | 5 refills | Status: DC

## 2018-04-23 NOTE — Patient Instructions (Addendum)
  1.  Allergen avoidance measures  2.  Treat and prevent reflux:   A.  Slowly taper off all forms of caffeine  B.  Pantoprazole 40 mg twice a day  C.  Famotidine 40 mg in evening  3.  Treat and prevent inflammation:   A.  Montelukast 10 mg tablet 1 time per day  B.  Nasacort AQ 2 sprays each nostril 1 time per day  4.  If needed:   A.  OTC antihistamine  5.  Return to clinic and 4 weeks or earlier if problem  6.  Evaluation and treatment for asthma?

## 2018-04-23 NOTE — Progress Notes (Signed)
[facility] [place] [facility] [place] [facility] [place] [facility] [place]    NEW PATIENT NOTE  Referring Provider: No ref. provider found Primary Provider: [name], MD Date of office visit: 04/23/2018    Subjective:   Chief Complaint:  Christina Wong (DOB: 05/23/1950) is a 68 y.o. female who presents to the clinic on 04/23/2018 with a chief complaint of Cough .     HPI: Christina Wong presents to this clinic in evaluation of cough.  Her history dates back to November 2018 at which point in time she became ill with an upper respiratory tract infection and a cough that never resolved.  This cough was associated with gagging and retching and occasional posttussive emesis and she saw Dr. Sherene Sires, pulmonology, and a very extensive evaluation was performed in conjunction with 2 visits to ENT and fortunately everything appeared to resolve after several months after she took a trip to [place] during the spring 2019.  Apparently she was given tramadol at that point in time to suppress her cough.  She did well until November 2019 and then once again redeveloped her cough.  She still continues to cough at this point in time.  She complains of an unrelenting cough associated with postnasal drip and something stuck in her throat and feeling as though her throat is clogged up.  She has some intermittent raspy voice.  Although she did become "sick" with a fever and just feeling bad in general along with her continued cough in January that had since resolved and now she is left with her lower airway and throat symptoms.  She has no associated shortness of breath or chest tightness and she has no phlegm production at this point nor chest pain.  She has no anosmia or ugly nasal discharge or headaches.  She does have a history of sneezing and nasal congestion that appears to be much better since she utilized a course of immunotherapy very early in life.  She still continues to use Nasacort and Zyrtec at this point in time.  She  does not really have any symptoms to suggest ongoing reflux disease.  Past Medical History:  Diagnosis Date  . Aphasia 07/11/13   x 10-15 min  . Elevated liver function tests    One of her studies are slightly elevated which may represent fatty liver from her Diabetes and her obesity or may represent an adverse effect from the Crestor.  . Exogenous obesity    She has had a problem with  . Headache(784.0)    "daily to weekly lately" (09/02/2013)  . Hypercholesterolemia   . Hypertension    hx of essential- on meds  . Infertility   . Leg cramps    in last 6 months  . Neck stiffness 07/13/14   and neck pain  . TIA (transient ischemic attack) 09/01/2013   "that's what they think"  . Type II diabetes mellitus (HCC)    mild  . Walking pneumonia ~ 1972    Past Surgical History:  Procedure Laterality Date  . ANKLE FRACTURE SURGERY Left ~ 2007  . ANTERIOR CERVICAL DECOMP/DISCECTOMY FUSION  2001  . BREAST BIOPSY    . CARDIOVASCULAR STRESS TEST  06-29-2002   EF is normal at 55%  . DILATION AND CURETTAGE OF UTERUS    . FOOT SURGERY Left 02/2014  . KNEE ARTHROSCOPY Left 1980's  . KNEE ARTHROSCOPY WITH MENISCAL REPAIR Right 2/14  . LAPAROSCOPY     multiple s/p infertility  . PILONIDAL CYST EXCISION    .  TONSILLECTOMY  1960's    Allergies as of 04/23/2018      Reactions   Lipitor [atorvastatin] Other (See Comments)   Leg cramps   Onion Nausea And Vomiting   Bactrim [sulfamethoxazole-trimethoprim] Other (See Comments)   Muscle issues   Sulfa Antibiotics Other (See Comments)   unknown      Medication List      azelastine 0.1 % nasal spray Commonly known as:  ASTELIN   CALCIUM 600 PO Take by mouth 2 (two) times daily.   clobetasol 0.05 % external solution Commonly known as:  TEMOVATE   clopidogrel 75 MG tablet Commonly known as:  PLAVIX Take 1 tablet (75 mg total) by mouth daily.   clotrimazole-betamethasone cream Commonly known as:  LOTRISONE Apply 1 application  topically as needed.   famotidine 40 MG tablet Commonly known as:  PEPCID Take 1 tablet (40 mg total) by mouth at bedtime.   HAIR/SKIN/NAILS PO Take by mouth.   HYDROCODONE-CHLORPHENIRAMINE PO Take by mouth. HYDROCODONE POLISTIREX/CHLORPHENIRAMINE POLISTIREX 10-8 MG/5ML SUER   losartan 50 MG tablet Commonly known as:  COZAAR Take 50 mg by mouth daily.   metFORMIN 500 MG tablet Commonly known as:  GLUCOPHAGE Take 500 mg by mouth 2 (two) times daily with a meal.   metroNIDAZOLE 1 % gel Commonly known as:  METROGEL   montelukast 10 MG tablet Commonly known as:  Singulair One at bedtime every night   NASACORT AQ NA Place 2 sprays into the nose daily.   pantoprazole 40 MG tablet Commonly known as:  PROTONIX Take 1 tablet (40 mg total) by mouth 2 (two) times daily.   potassium chloride SA 20 MEQ tablet Commonly known as:  K-DUR,KLOR-CON Take 20 mEq by mouth 2 (two) times daily.   rosuvastatin 5 MG tablet Commonly known as:  CRESTOR Take 5 mg by mouth every morning.   traMADol 50 MG tablet Commonly known as:  Ultram Take 1 tablet (50 mg total) by mouth every 4 (four) hours as needed.   Trulicity 1.5 MG/0.5ML Sopn Generic drug:  Dulaglutide Inject into the skin as directed.   VITAMIN B COMPLEX PO Take by mouth.   VITAMIN D PO Take 1,000 Units by mouth daily.   zolpidem 10 MG tablet Commonly known as:  AMBIEN Take 10 mg by mouth at bedtime. TAKING 1/2 TABLET AT BEDTIME        Review of systems negative except as noted in HPI / PMHx or noted below:  Review of Systems  Constitutional: Negative.   HENT: Negative.   Eyes: Negative.   Respiratory: Negative.   Cardiovascular: Negative.   Gastrointestinal: Negative.   Genitourinary: Negative.   Musculoskeletal: Negative.   Skin: Negative.   Neurological: Negative.   Endo/Heme/Allergies: Negative.   Psychiatric/Behavioral: Negative.     Family History  Problem Relation Age of Onset  . Heart Problems  Mother        aortic valve replacement  . Diabetes Father   . Hypertension Father   . CVA Father   . Hypertension Paternal Grandmother   . CVA Paternal Grandmother   . Breast cancer Maternal Aunt     Social History   Socioeconomic History  . Marital status: Married    Spouse name: Not on file  . Number of children: 1  . Years of education: BA  . Highest education level: Not on file  Occupational History  . Not on file  Social Needs  . Financial resource strain: Not on file  .  Food insecurity:    Worry: Not on file    Inability: Not on file  . Transportation needs:    Medical: Not on file    Non-medical: Not on file  Tobacco Use  . Smoking status: Never Smoker  . Smokeless tobacco: Never Used  Substance and Sexual Activity  . Alcohol use: Yes    Alcohol/week: 1.0 standard drinks    Types: 1 Glasses of wine per week    Comment: glass of wine daily  . Drug use: No  . Sexual activity: Yes    Partners: Male  Lifestyle  . Physical activity:    Days per week: Not on file    Minutes per session: Not on file  . Stress: Not on file  Relationships  . Social connections:    Talks on phone: Not on file    Gets together: Not on file    Attends religious service: Not on file    Active member of club or organization: Not on file    Attends meetings of clubs or organizations: Not on file    Relationship status: Not on file  . Intimate partner violence:    Fear of current or ex partner: Not on file    Emotionally abused: Not on file    Physically abused: Not on file    Forced sexual activity: Not on file  Other Topics Concern  . Not on file  Social History Narrative   Patient is married with 1 biological child, and 1 adopted child.   Patient is right handed.   Patient has her Bachelor's degree.   Patient drinks 1 cup daily.    Environmental and Social history  Lives in a house with a dry environment, no animals located inside the household, no carpet in the bedroom,  plastic on the bed, plastic on the pillow, no smokers located inside the household.  Objective:   Vitals:   04/23/18 1501  BP: (!) 140/96  Pulse: 67  Resp: 18  Temp: 98.5 F (36.9 C)  SpO2: 96%   Height: 5' 2.4" (158.5 cm) Weight: 201 lb (91.2 kg)  Physical Exam Constitutional:      Appearance: She is not diaphoretic.     Comments: Raspy voice, throat clearing  HENT:     Head: Normocephalic. No right periorbital erythema or left periorbital erythema.     Right Ear: Tympanic membrane, ear canal and external ear normal.     Left Ear: Tympanic membrane, ear canal and external ear normal.     Nose: Nose normal. No mucosal edema or rhinorrhea.     Mouth/Throat:     Pharynx: No oropharyngeal exudate.  Eyes:     General: Lids are normal.     Conjunctiva/sclera: Conjunctivae normal.     Pupils: Pupils are equal, round, and reactive to light.  Neck:     Thyroid: No thyromegaly.     Trachea: Trachea normal. No tracheal deviation.  Cardiovascular:     Rate and Rhythm: Normal rate and regular rhythm.     Heart sounds: Normal heart sounds, S1 normal and S2 normal. No murmur.  Pulmonary:     Effort: Pulmonary effort is normal. No respiratory distress.     Breath sounds: No stridor. No wheezing or rales.  Chest:     Chest wall: No tenderness.  Abdominal:     General: There is no distension.     Palpations: Abdomen is soft. There is no mass.     Tenderness: There is  no abdominal tenderness. There is no guarding or rebound.  Musculoskeletal:        General: No tenderness.  Lymphadenopathy:     Head:     Right side of head: No tonsillar adenopathy.     Left side of head: No tonsillar adenopathy.     Cervical: No cervical adenopathy.  Skin:    Coloration: Skin is not pale.     Findings: No erythema or rash.     Nails: There is no clubbing.   Neurological:     Mental Status: She is alert.     Diagnostics: Allergy skin tests were performed.  She demonstrated hypersensitivity  to grasses, weeds, trees, house dust mite and cat  Spirometry was performed and demonstrated an FEV1 of 1.79 @ 84 % of predicted. FEV1/FVC = 0.79.   Results of blood tests obtained 21 March 2017 identified WBC 11.2, absolute eosinophil 300, absolute lymphocyte 1200, hemoglobin 13.7, platelet 247, IgE 122 KU/L, IgE directed against multiple allergens including dust mite grasses trees weeds and to a lesser degree cat and dog, nitric oxide 13 PPB.  Results of a sinus CT scan obtained 02 April 2017 identified the following:  The visualized portions of the paranasal sinuses are clear without evidence of significant mucosal thickening or fluid. There is slight leftward nasal septal deviation. The regional soft tissues are grossly unremarkable.  Results of a chest x-ray obtained 10 March 2018 identified the following:  The heart size and mediastinal contours are within normal limits. Both lungs are clear. No pneumothorax or pleural effusion is noted. The visualized skeletal structures are unremarkable.   Assessment and Plan:    1. Perennial allergic rhinitis   2. LPRD (laryngopharyngeal reflux disease)   3. Eosinophilia      1.  Allergen avoidance measures  2.  Treat and prevent reflux:   A.  Slowly taper off all forms of caffeine  B.  Pantoprazole 40 mg twice a day  C.  Famotidine 40 mg in evening  3.  Treat and prevent inflammation:   A.  Montelukast 10 mg tablet 1 time per day  B.  Nasacort AQ 2 sprays each nostril 1 time per day  4.  If needed:   A.  OTC antihistamine  5.  Return to clinic and 4 weeks or earlier if problem  6.  Evaluation and treatment for asthma?  Evee appears to have atopic disease and reflux disease affecting her respiratory tract.  We will have her perform allergen avoidance measures as best as possible and utilize a combination of therapy directed against upper airway inflammation and reflux as noted above.  She may also have a component of  asthma especially given her atopic disease and eosinophilia but I will hold off on any further evaluation or treatment for lower airway inflammation at this point until we can see what type of effect she gets from her plan directed at reflux and upper airway inflammation.  We may need to have her laryngeal structures visualized by rhinoscopy if she remains symptomatic with mid airway issues.  I will see her back in this clinic in 4 weeks or earlier if there is a problem.  Jessica Priest, MD Allergy / Immunology [facility]

## 2018-05-19 ENCOUNTER — Other Ambulatory Visit: Payer: Self-pay

## 2018-05-19 ENCOUNTER — Encounter: Payer: Self-pay | Admitting: Allergy and Immunology

## 2018-05-19 ENCOUNTER — Ambulatory Visit (INDEPENDENT_AMBULATORY_CARE_PROVIDER_SITE_OTHER): Payer: Commercial Managed Care - PPO | Admitting: Allergy and Immunology

## 2018-05-19 VITALS — BP 130/90 | HR 75 | Resp 16

## 2018-05-19 DIAGNOSIS — K219 Gastro-esophageal reflux disease without esophagitis: Secondary | ICD-10-CM | POA: Diagnosis not present

## 2018-05-19 DIAGNOSIS — J3089 Other allergic rhinitis: Secondary | ICD-10-CM

## 2018-05-19 NOTE — Progress Notes (Signed)
[place]   Follow-up Note  Referring Provider: Elias Elseeade, Robert, MD Primary Provider: Elias Elseeade, Robert, MD Date of Office Visit: 05/19/2018  Subjective:   Christina Wong (DOB: 06/14/1950) is a 68 y.o. female who [facility] in re-evaluation of the following:  HPI: Christina Wong returns to this clinic in reevaluation of cough and LPR and allergic rhinitis addressed during her initial evaluation of 23 April 2018.  She has resolved her cough.  She still has some occasional postnasal drip and something stuck in her throat but that is better.  She is only using her famotidine at nighttime and she is not consuming any caffeine.  For some reason which I cannot clearly elucidate she is not using her pantoprazole.  She continues on montelukast and Nasacort.  She is able to go outdoors with pollen exposure with no problem.  She did need to take tramadol about 5 times total since I seen her in this clinic because she felt like this she had a little urge to cough.  Allergies as of 05/19/2018      Reactions   Lipitor [atorvastatin] Other (See Comments)   Leg cramps   Onion Nausea And Vomiting   Bactrim [sulfamethoxazole-trimethoprim] Other (See Comments)   Muscle issues   Sulfa Antibiotics Other (See Comments)   unknown      Medication List      CALCIUM 600 PO Take by mouth 2 (two) times daily.   clobetasol 0.05 % external solution Commonly known as:  TEMOVATE   clopidogrel 75 MG tablet Commonly known as:  PLAVIX Take 1 tablet (75 mg total) by mouth daily.   clotrimazole-betamethasone cream Commonly known as:  LOTRISONE Apply 1 application topically as needed.   famotidine 40 MG tablet Commonly known as:  PEPCID Take 1 tablet (40 mg total) by mouth at bedtime.   losartan 50 MG tablet Commonly known as:  COZAAR Take 50 mg by mouth daily.   metFORMIN 500 MG tablet Commonly known as:   GLUCOPHAGE Take 500 mg by mouth 2 (two) times daily with a meal.   metroNIDAZOLE 1 % gel Commonly known as:  METROGEL   montelukast 10 MG tablet Commonly known as:  Singulair One at bedtime every night   NASACORT AQ NA Place 2 sprays into the nose daily.   potassium chloride SA 20 MEQ tablet Commonly known as:  K-DUR,KLOR-CON Take 20 mEq by mouth 2 (two) times daily.   rosuvastatin 5 MG tablet Commonly known as:  CRESTOR Take 5 mg by mouth every morning.   traMADol 50 MG tablet Commonly known as:  Ultram Take 1 tablet (50 mg total) by mouth every 4 (four) hours as needed.   Trulicity 1.5 MG/0.5ML Sopn Generic drug:  Dulaglutide Inject into the skin as directed.   VITAMIN B COMPLEX PO Take by mouth.   VITAMIN D PO Take 1,000 Units by mouth daily.   zolpidem 10 MG tablet Commonly known as:  AMBIEN Take 10 mg by mouth at bedtime. TAKING 1/2 TABLET AT BEDTIME       Past Medical History:  Diagnosis Date  . Aphasia 07/11/13   x 10-15 min  . Elevated liver function tests    One of her studies are slightly elevated which may represent fatty liver from her Diabetes and her obesity or may represent an adverse effect from the Crestor.  . Exogenous obesity    She has had  a problem with  . Headache(784.0)    "daily to weekly lately" (09/02/2013)  . Hypercholesterolemia   . Hypertension    hx of essential- on meds  . Infertility   . Leg cramps    in last 6 months  . Neck stiffness 07/13/14   and neck pain  . TIA (transient ischemic attack) 09/01/2013   "that's what they think"  . Type II diabetes mellitus (HCC)    mild  . Walking pneumonia ~ 1972    Past Surgical History:  Procedure Laterality Date  . ANKLE FRACTURE SURGERY Left ~ 2007  . ANTERIOR CERVICAL DECOMP/DISCECTOMY FUSION  2001  . BREAST BIOPSY    . CARDIOVASCULAR STRESS TEST  06-29-2002   EF is normal at 55%  . DILATION AND CURETTAGE OF UTERUS    . FOOT SURGERY Left 02/2014  . KNEE ARTHROSCOPY Left  1980's  . KNEE ARTHROSCOPY WITH MENISCAL REPAIR Right 2/14  . LAPAROSCOPY     multiple s/p infertility  . PILONIDAL CYST EXCISION    . TONSILLECTOMY  1960's    Review of systems negative except as noted in HPI / PMHx or noted below:  Review of Systems  Constitutional: Negative.   HENT: Negative.   Eyes: Negative.   Respiratory: Negative.   Cardiovascular: Negative.   Gastrointestinal: Negative.   Genitourinary: Negative.   Musculoskeletal: Negative.   Skin: Negative.   Neurological: Negative.   Endo/Heme/Allergies: Negative.   Psychiatric/Behavioral: Negative.      Objective:   Vitals:   05/19/18 1140  BP: 130/90  Pulse: 75  Resp: 16  SpO2: 95%          Physical Exam Constitutional:      Appearance: She is not diaphoretic.  HENT:     Head: Normocephalic.     Right Ear: Tympanic membrane, ear canal and external ear normal.     Left Ear: Tympanic membrane, ear canal and external ear normal.     Nose: Nose normal. No mucosal edema or rhinorrhea.     Mouth/Throat:     Pharynx: Uvula midline. No oropharyngeal exudate.  Eyes:     Conjunctiva/sclera: Conjunctivae normal.  Neck:     Thyroid: No thyromegaly.     Trachea: Trachea normal. No tracheal tenderness or tracheal deviation.  Cardiovascular:     Rate and Rhythm: Normal rate and regular rhythm.     Heart sounds: Normal heart sounds, S1 normal and S2 normal. No murmur.  Pulmonary:     Effort: No respiratory distress.     Breath sounds: Normal breath sounds. No stridor. No wheezing or rales.  Lymphadenopathy:     Head:     Right side of head: No tonsillar adenopathy.     Left side of head: No tonsillar adenopathy.     Cervical: No cervical adenopathy.  Skin:    Findings: No erythema or rash.     Nails: There is no clubbing.   Neurological:     Mental Status: She is alert.     Diagnostics: none  Assessment and Plan:   1. Perennial allergic rhinitis   2. LPRD (laryngopharyngeal reflux disease)       1.  Continue to Treat and prevent reflux:   A.  Remain off all forms of caffeine  B.  Pantoprazole 40 mg EVERY MORNING  C.  Famotidine 40 mg in EVERY EVENING  2.  Continue to Treat and prevent inflammation:   A.  Montelukast 10 mg tablet 1 time per day  B.  Nasacort AQ 1-2 sprays each nostril 1 time per day  3.  If needed:   A.  OTC antihistamine  4.  Return to clinic in 12 weeks or earlier if problem   Josett appears to be doing better and I am going to keep her on a plan which includes therapy directed against inflammation of her upper airway and therapy directed against reflux.  I would like for her to be a little more aggressive about treating reflux and I have asked her to consistently use the pantoprazole in the morning and the famotidine in the evening.  We will see her back in this clinic in 12 weeks or earlier if there is a problem.  Laurette Schimke, MD Allergy / Immunology [facility]

## 2018-05-19 NOTE — Patient Instructions (Addendum)
  1.  Continue to Treat and prevent reflux:   A.  Remain off all forms of caffeine  B.  Pantoprazole 40 mg EVERY MORNING  C.  Famotidine 40 mg in EVERY EVENING  2.  Continue to Treat and prevent inflammation:   A.  Montelukast 10 mg tablet 1 time per day  B.  Nasacort AQ 1-2 sprays each nostril 1 time per day  3.  If needed:   A.  OTC antihistamine  4.  Return to clinic in 12 weeks or earlier if problem

## 2018-05-20 ENCOUNTER — Encounter: Payer: Self-pay | Admitting: Allergy and Immunology

## 2018-05-20 ENCOUNTER — Ambulatory Visit: Payer: Commercial Managed Care - PPO

## 2018-08-21 ENCOUNTER — Other Ambulatory Visit: Payer: Self-pay

## 2018-08-21 ENCOUNTER — Ambulatory Visit (INDEPENDENT_AMBULATORY_CARE_PROVIDER_SITE_OTHER): Payer: Medicare Other | Admitting: Allergy and Immunology

## 2018-08-21 ENCOUNTER — Encounter: Payer: Self-pay | Admitting: Allergy and Immunology

## 2018-08-21 DIAGNOSIS — J3089 Other allergic rhinitis: Secondary | ICD-10-CM

## 2018-08-21 DIAGNOSIS — K219 Gastro-esophageal reflux disease without esophagitis: Secondary | ICD-10-CM | POA: Diagnosis not present

## 2018-08-21 NOTE — Patient Instructions (Signed)
  1.  Continue to Treat and prevent reflux:   A.  Remain off all forms of caffeine  B.  Pantoprazole 40 mg EVERY MORNING  C.  Can attempt to disconitine famotidine   2.  Continue to Treat and prevent inflammation:   A.  Can attempt to discontinue Montelukast august 15th  B.  Nasacort AQ 1-2 sprays each nostril 1 time per day  3.  If needed:   A.  OTC antihistamine  4.  Return to clinic in December 2020 or earlier if problem  5. Obtain flu vaccine (and COVID vaccine)

## 2018-08-21 NOTE — Progress Notes (Signed)
[name]   Follow-up Note  Referring Provider: Elias Elseeade, Robert, MD Primary Provider: Elias Elseeade, Robert, MD Date of Office Visit: 08/21/2018  Subjective:   Christina Wong (DOB: 07/23/1950) is a 68 y.o. female who [facility] in re-evaluation of the following:  HPI: This is a E-med visit requested by patient who is located at home.  Christina PikesSusan is followed in this clinic for cough believed secondary to LPR and allergic rhinitis.  Her last visit to this clinic was 19 May 2018.  No longer with cough or throat issues. Nose doing well. Consistently using medications for airway inflammation and reflux. Some chocolate consumption but no other forms of caffeine.  Allergies as of 08/21/2018      Reactions   Lipitor [atorvastatin] Other (See Comments)   Leg cramps   Onion Nausea And Vomiting   Bactrim [sulfamethoxazole-trimethoprim] Other (See Comments)   Muscle issues   Sulfa Antibiotics Other (See Comments)   unknown      Medication List      CALCIUM 600 PO Take by mouth 2 (two) times daily.   clobetasol 0.05 % external solution Commonly known as: TEMOVATE   clopidogrel 75 MG tablet Commonly known as: PLAVIX Take 1 tablet (75 mg total) by mouth daily.   clotrimazole-betamethasone cream Commonly known as: LOTRISONE Apply 1 application topically as needed.   famotidine 40 MG tablet Commonly known as: PEPCID Take 1 tablet (40 mg total) by mouth at bedtime.   losartan 50 MG tablet Commonly known as: COZAAR Take 50 mg by mouth daily.   metFORMIN 500 MG tablet Commonly known as: GLUCOPHAGE Take 500 mg by mouth 2 (two) times daily with a meal.   metroNIDAZOLE 1 % gel Commonly known as: METROGEL   montelukast 10 MG tablet Commonly known as: Singulair One at bedtime every night   NASACORT AQ NA Place 2 sprays into the nose daily.   pantoprazole 40 MG tablet Commonly known as:  PROTONIX   potassium chloride SA 20 MEQ tablet Commonly known as: K-DUR Take 20 mEq by mouth 2 (two) times daily.   rosuvastatin 5 MG tablet Commonly known as: CRESTOR Take 5 mg by mouth every morning.   traMADol 50 MG tablet Commonly known as: Ultram Take 1 tablet (50 mg total) by mouth every 4 (four) hours as needed.   Trulicity 1.5 MG/0.5ML Sopn Generic drug: Dulaglutide Inject into the skin as directed.   VITAMIN B COMPLEX PO Take by mouth.   VITAMIN D PO Take 1,000 Units by mouth daily.   zolpidem 10 MG tablet Commonly known as: AMBIEN Take 10 mg by mouth at bedtime. TAKING 1/2 TABLET AT BEDTIME       Past Medical History:  Diagnosis Date  . Aphasia 07/11/13   x 10-15 min  . Elevated liver function tests    One of her studies are slightly elevated which may represent fatty liver from her Diabetes and her obesity or may represent an adverse effect from the Crestor.  . Exogenous obesity    She has had a problem with  . Headache(784.0)    "daily to weekly lately" (09/02/2013)  . Hypercholesterolemia   . Hypertension    hx of essential- on meds  . Infertility   . Leg cramps    in last 6 months  . Neck stiffness 07/13/14   and neck pain  . TIA (transient ischemic attack) 09/01/2013   "  that's what they think"  . Type II diabetes mellitus (HCC)    mild  . Walking pneumonia ~ 1972    Past Surgical History:  Procedure Laterality Date  . ANKLE FRACTURE SURGERY Left ~ 2007  . ANTERIOR CERVICAL DECOMP/DISCECTOMY FUSION  2001  . BREAST BIOPSY    . CARDIOVASCULAR STRESS TEST  06-29-2002   EF is normal at 55%  . DILATION AND CURETTAGE OF UTERUS    . FOOT SURGERY Left 02/2014  . KNEE ARTHROSCOPY Left 1980's  . KNEE ARTHROSCOPY WITH MENISCAL REPAIR Right 2/14  . LAPAROSCOPY     multiple s/p infertility  . PILONIDAL CYST EXCISION    . TONSILLECTOMY  1960's    Review of systems negative except as noted in HPI / PMHx or noted below:  Review of Systems   Constitutional: Negative.   HENT: Negative.   Eyes: Negative.   Respiratory: Negative.   Cardiovascular: Negative.   Gastrointestinal: Negative.   Genitourinary: Negative.   Musculoskeletal: Negative.   Skin: Negative.   Neurological: Negative.   Endo/Heme/Allergies: Negative.   Psychiatric/Behavioral: Negative.      Objective:   There were no vitals filed for this visit.        Physical Exam-deferred  Diagnostics: none  Assessment and Plan:   1. LPRD (laryngopharyngeal reflux disease)   2. Perennial allergic rhinitis      1.  Continue to Treat and prevent reflux:   A.  Remain off all forms of caffeine  B.  Pantoprazole 40 mg EVERY MORNING  C.  Can attempt to disconitine famotidine   2.  Continue to Treat and prevent inflammation:   A.  Can attempt to discontinue Montelukast august 15th  B.  Nasacort AQ 1-2 sprays each nostril 1 time per day  3.  If needed:   A.  OTC antihistamine  4.  Return to clinic in December 2020 or earlier if problem  5. Obtain flu vaccine (and COVID vaccine)   I will assume that Christina Wong will do well on the plan noted above which is an attempt to consolidate some of her medical therapy as she moves forward over these next several months.  She is very atopic and if this plan is inadequate in controlling her symptoms as she goes through this upcoming fall pollination season then obviously will need to change this plan.  I will see her back in this clinic in December 2020 or earlier if there is a problem.  Allena Katz, MD Allergy / Immunology [place]

## 2018-08-25 ENCOUNTER — Encounter: Payer: Self-pay | Admitting: Allergy and Immunology

## 2018-09-12 ENCOUNTER — Other Ambulatory Visit: Payer: Self-pay

## 2018-09-12 ENCOUNTER — Ambulatory Visit
Admission: RE | Admit: 2018-09-12 | Discharge: 2018-09-12 | Disposition: A | Discharge status: Home / Self Care | Payer: Commercial Managed Care - PPO | Source: Ambulatory Visit | Attending: Obstetrics & Gynecology | Admitting: Obstetrics & Gynecology

## 2018-09-12 DIAGNOSIS — Z1231 Encounter for screening mammogram for malignant neoplasm of breast: Secondary | ICD-10-CM

## 2018-11-19 ENCOUNTER — Other Ambulatory Visit: Payer: Self-pay | Admitting: Obstetrics & Gynecology

## 2018-11-19 ENCOUNTER — Ambulatory Visit (INDEPENDENT_AMBULATORY_CARE_PROVIDER_SITE_OTHER): Payer: Medicare Other | Admitting: Obstetrics & Gynecology

## 2018-11-19 ENCOUNTER — Other Ambulatory Visit: Payer: Self-pay

## 2018-11-19 ENCOUNTER — Encounter: Payer: Self-pay | Admitting: Obstetrics & Gynecology

## 2018-11-19 VITALS — BP 116/84 | HR 88 | Temp 97.4°F | Resp 16 | Ht 63.0 in | Wt 208.0 lb

## 2018-11-19 DIAGNOSIS — N9089 Other specified noninflammatory disorders of vulva and perineum: Secondary | ICD-10-CM

## 2018-11-19 DIAGNOSIS — Z01419 Encounter for gynecological examination (general) (routine) without abnormal findings: Secondary | ICD-10-CM

## 2018-11-19 DIAGNOSIS — Z23 Encounter for immunization: Secondary | ICD-10-CM | POA: Diagnosis not present

## 2018-11-19 MED ORDER — NITROFURANTOIN MONOHYD MACRO 100 MG PO CAPS: 100.0000 mg | ORAL_CAPSULE | Freq: Two times a day (BID) | ORAL | 1 refills | Status: DC

## 2018-11-19 MED ORDER — PHENAZOPYRIDINE HCL 100 MG PO TABS: 100.0000 mg | ORAL_TABLET | Freq: Three times a day (TID) | ORAL | 0 refills | Status: DC | PRN

## 2018-11-19 NOTE — Progress Notes (Signed)
68 y.o. G1P1 Married White or Caucasian female here for annual exam.  Doing well.  Denies vaginal bleeding.  She needs a flu shot today.  Tdap is due as well.    Having issues with UTI with traveling.  This seems to happened about 1 or 2 times a year.  Would really like to have something on hand for this if possible.    PCP:  Dr. Nicholos Johns every six months Allergist:  Hoyle Barr  Patient's last menstrual period was 02/05/2006.          Sexually active: Yes.    The current method of family planning is post menopausal status.    Exercising: Yes.    walking Smoker:  no  Health Maintenance: Pap:  03/26/16 Neg. HR HPV:neg   11/18/13 Neg  History of abnormal Pap:  no MMG:  09/12/18 BIRADS1:neg  Colonoscopy:  11/18/15 adenomatous polyps.  Follow up 5 years.  Dr. Loreta Ave.   BMD:   05/03/16 Normal  TDaP:  2010 Pneumonia vaccine(s):  Unsure.  Release of vaccination records will be signed today. Shingrix:   Has done the first one. Flu vacc: needs this today as possible Hep C testing: 03/26/16 Neg  Screening Labs: PCP   reports that she has never smoked. She has never used smokeless tobacco. She reports current alcohol use of about 7.0 standard drinks of alcohol per week. She reports that she does not use drugs.  Past Medical History:  Diagnosis Date  . Aphasia 07/11/13   x 10-15 min  . Elevated liver function tests    One of her studies are slightly elevated which may represent fatty liver from her Diabetes and her obesity or may represent an adverse effect from the Crestor.  . Exogenous obesity    She has had a problem with  . Headache(784.0)    "daily to weekly lately" (09/02/2013)  . Hypercholesterolemia   . Hypertension    hx of essential- on meds  . Infertility   . Leg cramps    in last 6 months  . Neck stiffness 07/13/14   and neck pain  . TIA (transient ischemic attack) 09/01/2013   "that's what they think"  . Type II diabetes mellitus (HCC)    mild  . Walking pneumonia ~ 1972     Past Surgical History:  Procedure Laterality Date  . ANKLE FRACTURE SURGERY Left ~ 2007  . ANTERIOR CERVICAL DECOMP/DISCECTOMY FUSION  2001  . BREAST BIOPSY    . CARDIOVASCULAR STRESS TEST  06-29-2002   EF is normal at 55%  . DILATION AND CURETTAGE OF UTERUS    . FOOT SURGERY Left 02/2014  . KNEE ARTHROSCOPY Left 1980's  . KNEE ARTHROSCOPY WITH MENISCAL REPAIR Right 2/14  . LAPAROSCOPY     multiple s/p infertility  . PILONIDAL CYST EXCISION    . TONSILLECTOMY  1960's    Current Outpatient Medications  Medication Sig Dispense Refill  . B Complex Vitamins (VITAMIN B COMPLEX PO) Take by mouth.    . Calcium Carbonate (CALCIUM 600 PO) Take by mouth 2 (two) times daily.    . clobetasol (TEMOVATE) 0.05 % external solution     . clopidogrel (PLAVIX) 75 MG tablet Take 1 tablet (75 mg total) by mouth daily. 90 tablet 0  . clotrimazole-betamethasone (LOTRISONE) cream Apply 1 application topically as needed.    . Dulaglutide (TRULICITY) 1.5 MG/0.5ML SOPN Inject into the skin as directed.    . famotidine (PEPCID) 40 MG tablet Take 1 tablet (  40 mg total) by mouth at bedtime. 30 tablet 5  . losartan (COZAAR) 50 MG tablet Take 50 mg by mouth daily.    . metFORMIN (GLUCOPHAGE) 500 MG tablet Take 500 mg by mouth 2 (two) times daily with a meal.    . metroNIDAZOLE (METROGEL) 1 % gel     . montelukast (SINGULAIR) 10 MG tablet One at bedtime every night 30 tablet 6  . Multiple Vitamins-Minerals (HAIR SKIN AND NAILS FORMULA PO) Take by mouth daily.    . potassium chloride SA (K-DUR,KLOR-CON) 20 MEQ tablet Take 20 mEq by mouth 2 (two) times daily.    . rosuvastatin (CRESTOR) 5 MG tablet Take 5 mg by mouth every morning.    . traMADol (ULTRAM) 50 MG tablet Take 1 tablet (50 mg total) by mouth every 4 (four) hours as needed. 40 tablet 1  . Triamcinolone Acetonide (NASACORT AQ NA) Place 2 sprays into the nose daily.    Marland Kitchen VITAMIN D PO Take 1,000 Units by mouth daily.    Marland Kitchen zolpidem (AMBIEN) 10 MG tablet  Take 10 mg by mouth at bedtime. TAKING 1/2 TABLET AT BEDTIME     No current facility-administered medications for this visit.     Family History  Problem Relation Age of Onset  . Heart Problems Mother        aortic valve replacement  . Diabetes Father   . Hypertension Father   . CVA Father   . Hypertension Paternal Grandmother   . CVA Paternal Grandmother   . Breast cancer Maternal Aunt     Review of Systems  All other systems reviewed and are negative.   Exam:   BP 116/84   Pulse 88   Temp (!) 97.4 F (36.3 C) (Temporal)   Resp 16   Ht 5\' 3"  (1.6 m)   Wt 208 lb (94.3 kg)   LMP 02/05/2006   BMI 36.85 kg/m      Height: 5\' 3"  (160 cm)  Ht Readings from Last 3 Encounters:  11/19/18 5\' 3"  (1.6 m)  04/23/18 5' 2.4" (1.585 m)  04/30/17 5' 2.5" (1.588 m)    General appearance: alert, cooperative and appears stated age Head: Normocephalic, without obvious abnormality, atraumatic Neck: no adenopathy, supple, symmetrical, trachea midline and thyroid normal to inspection and palpation Lungs: clear to auscultation bilaterally Breasts: normal appearance, no masses or tenderness Heart: regular rate and rhythm Abdomen: soft, non-tender; bowel sounds normal; no masses,  no organomegaly Extremities: extremities normal, atraumatic, no cyanosis or edema Skin: Skin color, texture, turgor normal. No rashes or lesions Lymph nodes: Cervical, supraclavicular, and axillary nodes normal. No abnormal inguinal nodes palpated Neurologic: Grossly normal   Pelvic: External genitalia:  Flat, pale lesion on left labia majora              Urethra:  normal appearing urethra with no masses, tenderness or lesions              Bartholins and Skenes: normal                 Vagina: normal appearing vagina with normal color and discharge, no lesions              Cervix: no lesions              Pap taken: No. Bimanual Exam:  Uterus:  normal size, contour, position, consistency, mobility, non-tender               Adnexa: normal adnexa and  no mass, fullness, tenderness               Rectovaginal: Confirms               Anus:  normal sphincter tone, no lesions  Procedure:  Verbal consent obtained prior to procedure.  Area cleansed with Betadine.  Sterile technique used throughout procedure.  Skin anesthestized with Lidocaine 1% plain; [id number] mL (lot) and  Exp:  05/2020.  Lesion elevatd with pick-ups and excised with scissors.  Adequate hemostasis obtained with silver nitrate sticks.  Dressing was not applied.  Pt tolerated procedure well.    Chaperone was present for exam.  A:  Well Woman with normal exam PMP, no HRT Diabetes Hypertension Elevated lipids Allergies/cough in the winter Recurrent UTIs  P:   Mammogram yearly.   pap smear with HR HPV obtained 2018.   Flu shot updated today as well as Tdap Release of shot records from Dr. Nicholos Johnseade updated today Colonoscopy and BMD are UTD Lab work done with Dr. Nicholos Johnseade. Pathology results for vulvar biopsy will be called to pt with additional recommendations made at that time as needed. Return annually or prn

## 2018-11-19 NOTE — Patient Instructions (Signed)

## 2018-11-24 ENCOUNTER — Telehealth: Payer: Self-pay | Admitting: *Deleted

## 2018-11-24 NOTE — Telephone Encounter (Signed)
-----   Message from Megan Salon, MD sent at 11/24/2018 10:16 AM EDT ----- Please let pt know pathology showed keratosis changes and no precancerous cells changes.  No yeast was present either.  She does not need any specific follow up if it heals well.  Thanks.

## 2018-11-24 NOTE — Telephone Encounter (Addendum)
LM for pt to call back.  Immunization report received. Patient's last Td was 03/18/2012, needs repeat in 2024.

## 2018-11-24 NOTE — Telephone Encounter (Signed)
Pt notified. Verbalized understanding.  Immunizations recorded in epic by Dr. Sabra Heck. Report to scan.

## 2019-02-26 ENCOUNTER — Ambulatory Visit: Payer: BLUE CROSS/BLUE SHIELD

## 2019-03-21 ENCOUNTER — Ambulatory Visit: Payer: BLUE CROSS/BLUE SHIELD

## 2019-06-25 ENCOUNTER — Telehealth: Payer: Self-pay | Admitting: Obstetrics & Gynecology

## 2019-06-25 NOTE — Telephone Encounter (Signed)
Patient states she has frequent urinary tract infections with traveling often.. She said Dr.Miller prescribes medication for to have on hand if needed. She is requesting refills of these but would like to talk with her nurse first. She said the last two urinary tract infections were different would like to discuss.

## 2019-06-25 NOTE — Telephone Encounter (Signed)
Spoke with patient. Patient reports Hx of recurrent UTIs, discussed with Dr. Hyacinth Meeker at [facility] 11/19/18, Rx was sent for Macrobid 100 mg bid x5 days with 1 refill. Patient states she has not had any UTIs until this month, she has had 2. Noticed blood in urine when symptoms started the second time, resolved after starting abx. Completed abx on 06/29/19. Patient reports all symptoms have resolved.   Patient asking what does she need to do for the future? She is concerned that she has had 2 in one month.   Advised patient to call office to schedule an OV if symptoms return. Will also review with Dr. Hyacinth Meeker and return call with any additional recommendations. Patient agreeable.   Dr. Hyacinth Meeker -please review and advise.

## 2019-06-26 NOTE — Telephone Encounter (Signed)
Per previous conversation, patient did use abx she had on hand for 2 most recent UTIs.   Call returned to patient, left detailed message, ok per dpr, name identified on voicemail. Advised as seen below per Dr. Hyacinth Meeker. Return call to office for OV with onset of symptoms. Return call to office if any additional questions.   Encounter closed.

## 2019-06-26 NOTE — Telephone Encounter (Signed)
Did she just use antibiotics she had on hand for the two most recent UTIs?  If that is the case, we need to do a urine culture with the next symptoms to see what bacteria is causing the infections before making any decisions about changing therapy.  Please have her call with onset of any new symptoms.  Thanks.

## 2019-08-24 ENCOUNTER — Other Ambulatory Visit: Payer: Self-pay

## 2019-08-24 ENCOUNTER — Other Ambulatory Visit: Payer: Self-pay | Admitting: Family Medicine

## 2019-08-24 ENCOUNTER — Ambulatory Visit
Admission: RE | Admit: 2019-08-24 | Discharge: 2019-08-24 | Disposition: A | Discharge status: Home / Self Care | Payer: Medicare Other | Source: Ambulatory Visit | Attending: Family Medicine | Admitting: Family Medicine

## 2019-08-24 DIAGNOSIS — R079 Chest pain, unspecified: Secondary | ICD-10-CM

## 2019-09-17 ENCOUNTER — Other Ambulatory Visit: Payer: Self-pay | Admitting: Obstetrics & Gynecology

## 2019-09-17 DIAGNOSIS — Z1231 Encounter for screening mammogram for malignant neoplasm of breast: Secondary | ICD-10-CM

## 2019-10-02 ENCOUNTER — Ambulatory Visit
Admission: RE | Admit: 2019-10-02 | Discharge: 2019-10-02 | Disposition: A | Discharge status: Home / Self Care | Payer: Medicare Other | Source: Ambulatory Visit | Attending: Obstetrics & Gynecology | Admitting: Obstetrics & Gynecology

## 2019-10-02 ENCOUNTER — Other Ambulatory Visit: Payer: Self-pay

## 2019-10-02 DIAGNOSIS — Z1231 Encounter for screening mammogram for malignant neoplasm of breast: Secondary | ICD-10-CM

## 2020-05-24 ENCOUNTER — Other Ambulatory Visit: Payer: Self-pay | Admitting: Physical Medicine & Rehabilitation

## 2020-05-24 DIAGNOSIS — M545 Low back pain, unspecified: Secondary | ICD-10-CM

## 2020-05-29 ENCOUNTER — Other Ambulatory Visit: Payer: Medicare Other

## 2020-07-11 ENCOUNTER — Ambulatory Visit
Admission: RE | Admit: 2020-07-11 | Discharge: 2020-07-11 | Disposition: A | Discharge status: Home / Self Care | Payer: Medicare Other | Source: Ambulatory Visit | Attending: Physical Medicine & Rehabilitation | Admitting: Physical Medicine & Rehabilitation

## 2020-07-11 ENCOUNTER — Other Ambulatory Visit: Payer: Self-pay

## 2020-07-11 DIAGNOSIS — M545 Low back pain, unspecified: Secondary | ICD-10-CM

## 2020-07-14 ENCOUNTER — Emergency Department (HOSPITAL_BASED_OUTPATIENT_CLINIC_OR_DEPARTMENT_OTHER): Payer: Medicare Other

## 2020-07-14 ENCOUNTER — Observation Stay (HOSPITAL_BASED_OUTPATIENT_CLINIC_OR_DEPARTMENT_OTHER)
Admission: EM | Admit: 2020-07-14 | Discharge: 2020-07-15 | Disposition: A | Discharge status: Home / Self Care | Payer: Medicare Other | Attending: Surgery | Admitting: Surgery

## 2020-07-14 ENCOUNTER — Inpatient Hospital Stay: Admit: 2020-07-14 | Payer: Medicare Other | Admitting: Surgery

## 2020-07-14 ENCOUNTER — Emergency Department (HOSPITAL_COMMUNITY): Payer: Medicare Other | Admitting: Certified Registered"

## 2020-07-14 ENCOUNTER — Encounter (HOSPITAL_COMMUNITY)
Admission: EM | Disposition: A | Discharge status: Home / Self Care | Payer: Self-pay | Source: Home / Self Care | Attending: Emergency Medicine

## 2020-07-14 ENCOUNTER — Encounter (HOSPITAL_BASED_OUTPATIENT_CLINIC_OR_DEPARTMENT_OTHER): Payer: Self-pay

## 2020-07-14 ENCOUNTER — Other Ambulatory Visit: Payer: Self-pay

## 2020-07-14 DIAGNOSIS — K819 Cholecystitis, unspecified: Secondary | ICD-10-CM

## 2020-07-14 DIAGNOSIS — Z96653 Presence of artificial knee joint, bilateral: Secondary | ICD-10-CM | POA: Insufficient documentation

## 2020-07-14 DIAGNOSIS — Z79899 Other long term (current) drug therapy: Secondary | ICD-10-CM | POA: Diagnosis not present

## 2020-07-14 DIAGNOSIS — I10 Essential (primary) hypertension: Secondary | ICD-10-CM | POA: Insufficient documentation

## 2020-07-14 DIAGNOSIS — K810 Acute cholecystitis: Principal | ICD-10-CM | POA: Diagnosis present

## 2020-07-14 DIAGNOSIS — Z20822 Contact with and (suspected) exposure to covid-19: Secondary | ICD-10-CM | POA: Diagnosis not present

## 2020-07-14 DIAGNOSIS — E119 Type 2 diabetes mellitus without complications: Secondary | ICD-10-CM | POA: Insufficient documentation

## 2020-07-14 DIAGNOSIS — Z7984 Long term (current) use of oral hypoglycemic drugs: Secondary | ICD-10-CM | POA: Insufficient documentation

## 2020-07-14 DIAGNOSIS — J45909 Unspecified asthma, uncomplicated: Secondary | ICD-10-CM | POA: Diagnosis not present

## 2020-07-14 DIAGNOSIS — Z8673 Personal history of transient ischemic attack (TIA), and cerebral infarction without residual deficits: Secondary | ICD-10-CM | POA: Diagnosis not present

## 2020-07-14 DIAGNOSIS — R1011 Right upper quadrant pain: Secondary | ICD-10-CM

## 2020-07-14 HISTORY — PX: CHOLECYSTECTOMY: SHX55

## 2020-07-14 HISTORY — DX: Nausea with vomiting, unspecified: R11.2

## 2020-07-14 HISTORY — DX: Other specified postprocedural states: Z98.890

## 2020-07-14 LAB — URINALYSIS, ROUTINE W REFLEX MICROSCOPIC
Bilirubin Urine: NEGATIVE
Glucose, UA: NEGATIVE mg/dL
Ketones, ur: 40 mg/dL — AB
Nitrite: NEGATIVE
Protein, ur: NEGATIVE mg/dL
Specific Gravity, Urine: 1.025 (ref 1.005–1.030)
pH: 6 (ref 5.0–8.0)

## 2020-07-14 LAB — COMPREHENSIVE METABOLIC PANEL
ALT: 21 U/L (ref 0–44)
AST: 17 U/L (ref 15–41)
Albumin: 4.2 g/dL (ref 3.5–5.0)
Alkaline Phosphatase: 76 U/L (ref 38–126)
Anion gap: 11 (ref 5–15)
BUN: 14 mg/dL (ref 8–23)
CO2: 24 mmol/L (ref 22–32)
Calcium: 10.2 mg/dL (ref 8.9–10.3)
Chloride: 102 mmol/L (ref 98–111)
Creatinine, Ser: 0.63 mg/dL (ref 0.44–1.00)
GFR, Estimated: >60 mL/min (ref >60)
Glucose, Bld: 146 mg/dL — ABNORMAL HIGH (ref 70–99)
Potassium: 3.9 mmol/L (ref 3.5–5.1)
Sodium: 137 mmol/L (ref 135–145)
Total Bilirubin: 1.1 mg/dL (ref 0.3–1.2)
Total Protein: 7.9 g/dL (ref 6.5–8.1)

## 2020-07-14 LAB — GLUCOSE, CAPILLARY
Glucose-Capillary: 138 mg/dL — ABNORMAL HIGH (ref 70–99)
Glucose-Capillary: 206 mg/dL — ABNORMAL HIGH (ref 70–99)
Glucose-Capillary: 269 mg/dL — ABNORMAL HIGH (ref 70–99)

## 2020-07-14 LAB — CBC WITH DIFFERENTIAL/PLATELET
Abs Immature Granulocytes: 0.06 10*3/uL (ref 0.00–0.07)
Basophils Absolute: 0.1 10*3/uL (ref 0.0–0.1)
Basophils Relative: 0 %
Eosinophils Absolute: 0 10*3/uL (ref 0.0–0.5)
Eosinophils Relative: 0 %
HCT: 46.3 % — ABNORMAL HIGH (ref 36.0–46.0)
Hemoglobin: 15.5 g/dL — ABNORMAL HIGH (ref 12.0–15.0)
Immature Granulocytes: 1 %
Lymphocytes Relative: 7 %
Lymphs Abs: 0.9 10*3/uL (ref 0.7–4.0)
MCH: 30.1 pg (ref 26.0–34.0)
MCHC: 33.5 g/dL (ref 30.0–36.0)
MCV: 89.9 fL (ref 80.0–100.0)
Monocytes Absolute: 0.8 10*3/uL (ref 0.1–1.0)
Monocytes Relative: 6 %
Neutro Abs: 11.5 10*3/uL — ABNORMAL HIGH (ref 1.7–7.7)
Neutrophils Relative %: 86 %
Platelets: 175 10*3/uL (ref 150–400)
RBC: 5.15 MIL/uL — ABNORMAL HIGH (ref 3.87–5.11)
RDW: 13.2 % (ref 11.5–15.5)
WBC: 13.3 10*3/uL — ABNORMAL HIGH (ref 4.0–10.5)
nRBC: 0 % (ref 0.0–0.2)

## 2020-07-14 LAB — URINALYSIS, MICROSCOPIC (REFLEX)

## 2020-07-14 LAB — RESP PANEL BY RT-PCR (FLU A&B, COVID) ARPGX2
Influenza A by PCR: NEGATIVE
Influenza B by PCR: NEGATIVE
SARS Coronavirus 2 by RT PCR: NEGATIVE

## 2020-07-14 LAB — LIPASE, BLOOD: Lipase: 35 U/L (ref 11–51)

## 2020-07-14 SURGERY — LAPAROSCOPIC CHOLECYSTECTOMY WITH INTRAOPERATIVE CHOLANGIOGRAM
Anesthesia: General

## 2020-07-14 MED ORDER — BUPIVACAINE-EPINEPHRINE 0.25% -1:200000 IJ SOLN
INTRAMUSCULAR | Status: DC | PRN
  Administered 2020-07-14: 30 mL

## 2020-07-14 MED ORDER — HYDROMORPHONE HCL 1 MG/ML IJ SOLN
INTRAMUSCULAR | Status: AC
  Administered 2020-07-14: 0.5 mg via INTRAVENOUS
  Filled 2020-07-14: qty 1

## 2020-07-14 MED ORDER — ZOLPIDEM TARTRATE 5 MG PO TABS
5.0000 mg | ORAL_TABLET | Freq: Every day | ORAL | Status: DC
  Administered 2020-07-14: 5 mg via ORAL
  Filled 2020-07-14: qty 1

## 2020-07-14 MED ORDER — METHOCARBAMOL 1000 MG/10ML IJ SOLN
500.0000 mg | Freq: Four times a day (QID) | INTRAVENOUS | Status: DC | PRN
  Filled 2020-07-14: qty 5

## 2020-07-14 MED ORDER — SUCCINYLCHOLINE CHLORIDE 200 MG/10ML IV SOSY
PREFILLED_SYRINGE | INTRAVENOUS | Status: DC | PRN
  Administered 2020-07-14: 120 mg via INTRAVENOUS

## 2020-07-14 MED ORDER — FENTANYL CITRATE (PF) 100 MCG/2ML IJ SOLN
INTRAMUSCULAR | Status: AC
  Filled 2020-07-14: qty 2

## 2020-07-14 MED ORDER — SODIUM CHLORIDE 0.9 % IV SOLN: INTRAVENOUS | Status: DC | PRN

## 2020-07-14 MED ORDER — ROSUVASTATIN CALCIUM 5 MG PO TABS
5.0000 mg | ORAL_TABLET | Freq: Every morning | ORAL | Status: DC
  Filled 2020-07-14: qty 1

## 2020-07-14 MED ORDER — LACTATED RINGERS IR SOLN
Status: DC | PRN
  Administered 2020-07-14: 1000 mL

## 2020-07-14 MED ORDER — ROCURONIUM BROMIDE 10 MG/ML (PF) SYRINGE
PREFILLED_SYRINGE | INTRAVENOUS | Status: DC | PRN
  Administered 2020-07-14: 40 mg via INTRAVENOUS

## 2020-07-14 MED ORDER — BUPIVACAINE-EPINEPHRINE (PF) 0.25% -1:200000 IJ SOLN
INTRAMUSCULAR | Status: AC
  Filled 2020-07-14: qty 30

## 2020-07-14 MED ORDER — DEXAMETHASONE SODIUM PHOSPHATE 10 MG/ML IJ SOLN
INTRAMUSCULAR | Status: AC
  Filled 2020-07-14: qty 1

## 2020-07-14 MED ORDER — POLYETHYLENE GLYCOL 3350 17 G PO PACK: 17.0000 g | PACK | Freq: Every day | ORAL | Status: DC | PRN

## 2020-07-14 MED ORDER — LOSARTAN POTASSIUM 50 MG PO TABS
50.0000 mg | ORAL_TABLET | Freq: Every day | ORAL | Status: DC
  Administered 2020-07-14: 50 mg via ORAL
  Filled 2020-07-14 (×2): qty 1

## 2020-07-14 MED ORDER — PROPOFOL 10 MG/ML IV BOLUS
INTRAVENOUS | Status: DC | PRN
  Administered 2020-07-14: 110 mg via INTRAVENOUS

## 2020-07-14 MED ORDER — SUGAMMADEX SODIUM 200 MG/2ML IV SOLN
INTRAVENOUS | Status: DC | PRN
  Administered 2020-07-14: 200 mg via INTRAVENOUS

## 2020-07-14 MED ORDER — MIDAZOLAM HCL 2 MG/2ML IJ SOLN
INTRAMUSCULAR | Status: AC
  Filled 2020-07-14: qty 2

## 2020-07-14 MED ORDER — ONDANSETRON HCL 4 MG/2ML IJ SOLN: 4.0000 mg | Freq: Once | INTRAMUSCULAR | Status: AC | PRN

## 2020-07-14 MED ORDER — MIDAZOLAM HCL 2 MG/2ML IJ SOLN
INTRAMUSCULAR | Status: DC | PRN
  Administered 2020-07-14: 2 mg via INTRAVENOUS

## 2020-07-14 MED ORDER — ONDANSETRON HCL 4 MG/2ML IJ SOLN
INTRAMUSCULAR | Status: AC
  Administered 2020-07-14: 4 mg via INTRAVENOUS
  Filled 2020-07-14: qty 2

## 2020-07-14 MED ORDER — LIDOCAINE 2% (20 MG/ML) 5 ML SYRINGE
INTRAMUSCULAR | Status: DC | PRN
  Administered 2020-07-14: 100 mg via INTRAVENOUS

## 2020-07-14 MED ORDER — OXYCODONE HCL 5 MG PO TABS
ORAL_TABLET | ORAL | Status: AC
  Administered 2020-07-14: 5 mg via ORAL
  Filled 2020-07-14: qty 1

## 2020-07-14 MED ORDER — HYDROMORPHONE HCL 1 MG/ML IJ SOLN
0.2500 mg | INTRAMUSCULAR | Status: DC | PRN
  Administered 2020-07-14 (×2): 0.5 mg via INTRAVENOUS

## 2020-07-14 MED ORDER — OXYCODONE HCL 5 MG/5ML PO SOLN: 5.0000 mg | Freq: Once | ORAL | Status: AC | PRN

## 2020-07-14 MED ORDER — SODIUM CHLORIDE 0.9 % IV SOLN
2.0000 g | Freq: Once | INTRAVENOUS | Status: AC
  Administered 2020-07-14: 2 g via INTRAVENOUS
  Filled 2020-07-14: qty 20

## 2020-07-14 MED ORDER — LACTATED RINGERS IV BOLUS
1000.0000 mL | Freq: Once | INTRAVENOUS | Status: AC
  Administered 2020-07-14: 1000 mL via INTRAVENOUS

## 2020-07-14 MED ORDER — IOHEXOL 300 MG/ML  SOLN
75.0000 mL | Freq: Once | INTRAMUSCULAR | Status: AC | PRN
  Administered 2020-07-14: 75 mL via INTRAVENOUS

## 2020-07-14 MED ORDER — METHOCARBAMOL 500 MG PO TABS
500.0000 mg | ORAL_TABLET | Freq: Four times a day (QID) | ORAL | Status: DC | PRN
  Administered 2020-07-15: 500 mg via ORAL
  Filled 2020-07-14: qty 1

## 2020-07-14 MED ORDER — SODIUM CHLORIDE 0.9 % IV SOLN
2.0000 g | INTRAVENOUS | Status: DC
  Filled 2020-07-14: qty 20

## 2020-07-14 MED ORDER — HYDROMORPHONE HCL 1 MG/ML IJ SOLN
0.5000 mg | INTRAMUSCULAR | Status: DC | PRN
Start: 2020-07-14 — End: 2020-07-15

## 2020-07-14 MED ORDER — POTASSIUM CHLORIDE IN NACL 20-0.9 MEQ/L-% IV SOLN
INTRAVENOUS | Status: DC
  Filled 2020-07-14 (×3): qty 1000

## 2020-07-14 MED ORDER — FENTANYL CITRATE (PF) 100 MCG/2ML IJ SOLN: 50.0000 ug | INTRAMUSCULAR | Status: DC | PRN

## 2020-07-14 MED ORDER — FENTANYL CITRATE (PF) 100 MCG/2ML IJ SOLN
50.0000 ug | Freq: Once | INTRAMUSCULAR | Status: AC
  Administered 2020-07-14: 50 ug via INTRAVENOUS
  Filled 2020-07-14: qty 2

## 2020-07-14 MED ORDER — ONDANSETRON HCL 4 MG/2ML IJ SOLN
INTRAMUSCULAR | Status: AC
  Filled 2020-07-14: qty 2

## 2020-07-14 MED ORDER — LABETALOL HCL 5 MG/ML IV SOLN
INTRAVENOUS | Status: AC
  Filled 2020-07-14: qty 4

## 2020-07-14 MED ORDER — ACETAMINOPHEN 500 MG PO TABS
1000.0000 mg | ORAL_TABLET | Freq: Four times a day (QID) | ORAL | Status: DC
  Administered 2020-07-14 – 2020-07-15 (×3): 1000 mg via ORAL
  Filled 2020-07-14 (×3): qty 2

## 2020-07-14 MED ORDER — DIPHENHYDRAMINE HCL 50 MG/ML IJ SOLN: 12.5000 mg | Freq: Four times a day (QID) | INTRAMUSCULAR | Status: DC | PRN

## 2020-07-14 MED ORDER — LACTATED RINGERS IV SOLN: INTRAVENOUS | Status: DC

## 2020-07-14 MED ORDER — OXYCODONE HCL 5 MG PO TABS: 5.0000 mg | ORAL_TABLET | Freq: Once | ORAL | Status: AC | PRN

## 2020-07-14 MED ORDER — POTASSIUM CHLORIDE CRYS ER 20 MEQ PO TBCR
20.0000 meq | EXTENDED_RELEASE_TABLET | Freq: Two times a day (BID) | ORAL | Status: DC
  Administered 2020-07-14: 20 meq via ORAL
  Filled 2020-07-14 (×2): qty 1

## 2020-07-14 MED ORDER — OXYCODONE HCL 5 MG PO TABS
5.0000 mg | ORAL_TABLET | ORAL | Status: DC | PRN
  Administered 2020-07-14 – 2020-07-15 (×3): 10 mg via ORAL
  Filled 2020-07-14 (×3): qty 2

## 2020-07-14 MED ORDER — ONDANSETRON HCL 4 MG/2ML IJ SOLN
INTRAMUSCULAR | Status: DC | PRN
  Administered 2020-07-14: 4 mg via INTRAVENOUS

## 2020-07-14 MED ORDER — 0.9 % SODIUM CHLORIDE (POUR BTL) OPTIME
TOPICAL | Status: DC | PRN
  Administered 2020-07-14: 1000 mL

## 2020-07-14 MED ORDER — ONDANSETRON 4 MG PO TBDP: 4.0000 mg | ORAL_TABLET | Freq: Four times a day (QID) | ORAL | Status: DC | PRN

## 2020-07-14 MED ORDER — ONDANSETRON HCL 4 MG/2ML IJ SOLN
4.0000 mg | Freq: Once | INTRAMUSCULAR | Status: AC
  Administered 2020-07-14: 4 mg via INTRAVENOUS
  Filled 2020-07-14: qty 2

## 2020-07-14 MED ORDER — FENTANYL CITRATE (PF) 100 MCG/2ML IJ SOLN
INTRAMUSCULAR | Status: DC | PRN
  Administered 2020-07-14 (×5): 50 ug via INTRAVENOUS

## 2020-07-14 MED ORDER — ROCURONIUM BROMIDE 10 MG/ML (PF) SYRINGE
PREFILLED_SYRINGE | INTRAVENOUS | Status: AC
  Filled 2020-07-14: qty 10

## 2020-07-14 MED ORDER — LABETALOL HCL 5 MG/ML IV SOLN
INTRAVENOUS | Status: DC | PRN
  Administered 2020-07-14: 5 mg via INTRAVENOUS

## 2020-07-14 MED ORDER — LIDOCAINE 2% (20 MG/ML) 5 ML SYRINGE
INTRAMUSCULAR | Status: AC
  Filled 2020-07-14: qty 5

## 2020-07-14 MED ORDER — ONDANSETRON HCL 4 MG/2ML IJ SOLN: 4.0000 mg | Freq: Four times a day (QID) | INTRAMUSCULAR | Status: DC | PRN

## 2020-07-14 MED ORDER — SIMETHICONE 80 MG PO CHEW: 40.0000 mg | CHEWABLE_TABLET | Freq: Four times a day (QID) | ORAL | Status: DC | PRN

## 2020-07-14 MED ORDER — INSULIN ASPART 100 UNIT/ML IJ SOLN
0.0000 [IU] | Freq: Three times a day (TID) | INTRAMUSCULAR | Status: DC
Start: 2020-07-15 — End: 2020-07-15

## 2020-07-14 MED ORDER — CHLORHEXIDINE GLUCONATE 0.12 % MT SOLN
15.0000 mL | Freq: Once | OROMUCOSAL | Status: AC
  Administered 2020-07-14: 15 mL via OROMUCOSAL

## 2020-07-14 MED ORDER — DEXAMETHASONE SODIUM PHOSPHATE 10 MG/ML IJ SOLN
INTRAMUSCULAR | Status: DC | PRN
  Administered 2020-07-14: 4 mg via INTRAVENOUS

## 2020-07-14 MED ORDER — ONDANSETRON HCL 4 MG/2ML IJ SOLN: 4.0000 mg | INTRAMUSCULAR | Status: DC | PRN

## 2020-07-14 MED ORDER — HYDRALAZINE HCL 20 MG/ML IJ SOLN: 10.0000 mg | INTRAMUSCULAR | Status: DC | PRN

## 2020-07-14 MED ORDER — DIPHENHYDRAMINE HCL 12.5 MG/5ML PO ELIX: 12.5000 mg | ORAL_SOLUTION | Freq: Four times a day (QID) | ORAL | Status: DC | PRN

## 2020-07-14 MED ORDER — SODIUM CHLORIDE 0.9 % IV SOLN: 2.0000 g | Freq: Once | INTRAVENOUS | Status: DC

## 2020-07-14 MED ORDER — SUCCINYLCHOLINE CHLORIDE 200 MG/10ML IV SOSY
PREFILLED_SYRINGE | INTRAVENOUS | Status: AC
  Filled 2020-07-14: qty 10

## 2020-07-14 MED ORDER — ENOXAPARIN SODIUM 40 MG/0.4ML IJ SOSY: 40.0000 mg | PREFILLED_SYRINGE | INTRAMUSCULAR | Status: DC

## 2020-07-14 MED ORDER — ACETAMINOPHEN 325 MG PO TABS
650.0000 mg | ORAL_TABLET | Freq: Once | ORAL | Status: AC
  Administered 2020-07-14: 650 mg via ORAL
  Filled 2020-07-14: qty 2

## 2020-07-14 MED ORDER — PROPOFOL 10 MG/ML IV BOLUS
INTRAVENOUS | Status: AC
  Filled 2020-07-14: qty 20

## 2020-07-14 SURGICAL SUPPLY — 41 items
ADH SKN CLS APL DERMABOND .7 (GAUZE/BANDAGES/DRESSINGS) ×1
APL PRP STRL LF DISP 70% ISPRP (MISCELLANEOUS) ×1
APPLIER CLIP ROT 10 11.4 M/L (STAPLE) ×2
APR CLP MED LRG 11.4X10 (STAPLE) ×1
BAG SPEC RTRVL LRG 6X4 10 (ENDOMECHANICALS) ×1
CABLE HIGH FREQUENCY MONO STRZ (ELECTRODE) ×1 IMPLANT
CHLORAPREP W/TINT 26 (MISCELLANEOUS) ×2 IMPLANT
CLIP APPLIE ROT 10 11.4 M/L (STAPLE) ×1 IMPLANT
COVER MAYO STAND STRL (DRAPES) IMPLANT
COVER SURGICAL LIGHT HANDLE (MISCELLANEOUS) ×2 IMPLANT
COVER WAND RF STERILE (DRAPES) IMPLANT
DERMABOND ADVANCED (GAUZE/BANDAGES/DRESSINGS) ×1
DERMABOND ADVANCED .7 DNX12 (GAUZE/BANDAGES/DRESSINGS) ×1 IMPLANT
DRAPE C-ARM 42X120 X-RAY (DRAPES) IMPLANT
ELECT REM PT RETURN 15FT ADLT (MISCELLANEOUS) ×2 IMPLANT
GLOVE SURG ENC MOIS LTX SZ6 (GLOVE) ×2 IMPLANT
GLOVE SURG MICRO LTX SZ6 (GLOVE) ×2 IMPLANT
GLOVE SURG MICRO LTX SZ7 (GLOVE) ×1 IMPLANT
GLOVE SURG UNDER LTX SZ6.5 (GLOVE) ×2 IMPLANT
GLOVE SURG UNDER POLY LF SZ7.5 (GLOVE) ×1 IMPLANT
GOWN STRL REUS W/TWL LRG LVL3 (GOWN DISPOSABLE) ×3 IMPLANT
GOWN STRL REUS W/TWL XL LVL3 (GOWN DISPOSABLE) ×4 IMPLANT
GRASPER SUT TROCAR 14GX15 (MISCELLANEOUS) IMPLANT
HEMOSTAT SNOW SURGICEL 2X4 (HEMOSTASIS) IMPLANT
KIT BASIN OR (CUSTOM PROCEDURE TRAY) ×2 IMPLANT
KIT TURNOVER KIT A (KITS) ×2 IMPLANT
NDL INSUFFLATION 14GA 120MM (NEEDLE) ×1 IMPLANT
NEEDLE INSUFFLATION 14GA 120MM (NEEDLE) ×2 IMPLANT
PENCIL SMOKE EVACUATOR (MISCELLANEOUS) IMPLANT
POUCH SPECIMEN RETRIEVAL 10MM (ENDOMECHANICALS) ×2 IMPLANT
SCISSORS LAP 5X35 DISP (ENDOMECHANICALS) ×2 IMPLANT
SET CHOLANGIOGRAPH MIX (MISCELLANEOUS) IMPLANT
SET IRRIG TUBING LAPAROSCOPIC (IRRIGATION / IRRIGATOR) ×2 IMPLANT
SET TUBE SMOKE EVAC HIGH FLOW (TUBING) ×2 IMPLANT
SLEEVE XCEL OPT CAN 5 100 (ENDOMECHANICALS) ×4 IMPLANT
SUT MNCRL AB 4-0 PS2 18 (SUTURE) ×2 IMPLANT
TOWEL OR 17X26 10 PK STRL BLUE (TOWEL DISPOSABLE) ×2 IMPLANT
TOWEL OR NON WOVEN STRL DISP B (DISPOSABLE) IMPLANT
TRAY LAPAROSCOPIC (CUSTOM PROCEDURE TRAY) ×2 IMPLANT
TROCAR BLADELESS OPT 5 100 (ENDOMECHANICALS) ×2 IMPLANT
TROCAR XCEL 12X100 BLDLESS (ENDOMECHANICALS) ×2 IMPLANT

## 2020-07-14 NOTE — ED Notes (Signed)
Ultrasound at bedside

## 2020-07-14 NOTE — ED Notes (Signed)
ED MD informed of temp 100.7 and c/o of chills

## 2020-07-14 NOTE — ED Notes (Signed)
ED Provider at bedside. 

## 2020-07-14 NOTE — Anesthesia Preprocedure Evaluation (Addendum)
Anesthesia Evaluation  Patient identified by MRN, date of birth, ID band Patient awake    Reviewed: Allergy & Precautions, NPO status , Patient's Chart, lab work & pertinent test results  Airway Mallampati: II  TM Distance: >3 FB Neck ROM: Full    Dental no notable dental hx.    Pulmonary neg pulmonary ROS,    Pulmonary exam normal breath sounds clear to auscultation       Cardiovascular hypertension, Normal cardiovascular exam Rhythm:Regular Rate:Normal     Neuro/Psych TIAnegative psych ROS   GI/Hepatic negative GI ROS, Neg liver ROS,   Endo/Other  diabetes  Renal/GU negative Renal ROS  negative genitourinary   Musculoskeletal negative musculoskeletal ROS (+)   Abdominal   Peds negative pediatric ROS (+)  Hematology  (+) Blood dyscrasia, , Patient on plavix   Anesthesia Other Findings   Reproductive/Obstetrics negative OB ROS                            Anesthesia Physical Anesthesia Plan  ASA: 2  Anesthesia Plan: General   Post-op Pain Management:    Induction: Intravenous  PONV Risk Score and Plan: 3 and Ondansetron, Dexamethasone and Treatment may vary due to age or medical condition  Airway Management Planned: Oral ETT  Additional Equipment:   Intra-op Plan:   Post-operative Plan: Extubation in OR  Informed Consent: I have reviewed the patients History and Physical, chart, labs and discussed the procedure including the risks, benefits and alternatives for the proposed anesthesia with the patient or authorized representative who has indicated his/her understanding and acceptance.     Dental advisory given  Plan Discussed with: CRNA and Surgeon  Anesthesia Plan Comments:         Anesthesia Quick Evaluation

## 2020-07-14 NOTE — Discharge Instructions (Addendum)
[facility] ° °Please arrive at least 30 min before your appointment to complete your check in paperwork.  If you are unable to arrive 30 min prior to your appointment time we may have to cancel or reschedule you. °LAPAROSCOPIC SURGERY: POST OP INSTRUCTIONS °Always review your discharge instruction sheet given to you by the facility where your surgery was performed. °IF YOU HAVE DISABILITY OR FAMILY LEAVE FORMS, YOU MUST BRING THEM TO THE OFFICE FOR PROCESSING.   °DO NOT GIVE THEM TO YOUR DOCTOR. ° °PAIN CONTROL ° °First take acetaminophen (Tylenol) AND/or ibuprofen (Advil) to control your pain after surgery.  Follow directions on package.  Taking acetaminophen (Tylenol) and/or ibuprofen (Advil) regularly after surgery will help to control your pain and lower the amount of prescription pain medication you may need.  You should not take more than 4,000 mg (4 grams) of acetaminophen (Tylenol) in 24 hours.  You should not take ibuprofen (Advil), aleve, motrin, naprosyn or other NSAIDS if you have a history of stomach ulcers or chronic kidney disease.  °A prescription for pain medication may be given to you upon discharge.  Take your pain medication as prescribed, if you still have uncontrolled pain after taking acetaminophen (Tylenol) or ibuprofen (Advil). °Use ice packs to help control pain. °If you need a refill on your pain medication, please contact your pharmacy.  They will contact our office to request authorization. Prescriptions will not be filled after 5pm or on week-ends. ° °HOME MEDICATIONS °Take your usually prescribed medications unless otherwise directed. ° °DIET °You should follow a light diet the first few days after arrival home.  Be sure to include lots of fluids daily. Avoid fatty, fried foods.  ° °CONSTIPATION °It is common to experience some constipation after surgery and if you are taking pain medication.  Increasing fluid intake and taking a stool softener (such as Colace)  will usually help or prevent this problem from occurring.  A mild laxative (Milk of Magnesia or Miralax) should be taken according to package instructions if there are no bowel movements after 48 hours. ° °WOUND/INCISION CARE °Most patients will experience some swelling and bruising in the area of the incisions.  Ice packs will help.  Swelling and bruising can take several days to resolve.  °Unless discharge instructions indicate otherwise, follow guidelines below  °STERI-STRIPS - you may remove your outer bandages 48 hours after surgery, and you may shower at that time.  You have steri-strips (small skin tapes) in place directly over the incision.  These strips should be left on the skin for 7-10 days.   °DERMABOND/SKIN GLUE - you may shower in 24 hours.  The glue will flake off over the next 2-3 weeks. °Any sutures or staples will be removed at the office during your follow-up visit. ° °ACTIVITIES °You may resume regular (light) daily activities beginning the next day--such as daily self-care, walking, climbing stairs--gradually increasing activities as tolerated.  You may have sexual intercourse when it is comfortable.  Refrain from any heavy lifting or straining until approved by your doctor. °You may drive when you are no longer taking prescription pain medication, you can comfortably wear a seatbelt, and you can safely maneuver your car and apply brakes. ° °FOLLOW-UP °You should see your doctor in the office for a follow-up appointment approximately 2-3 weeks after your surgery.  You should have been given your post-op/follow-up appointment when your surgery was scheduled.  If you did not receive a post-op/follow-up appointment, make sure   that you call for this appointment within a day or two after you arrive home to insure a convenient appointment time. ° ° °WHEN TO CALL YOUR DOCTOR: °Fever over 101.0 °Inability to urinate °Continued bleeding from incision. °Increased pain, redness, or drainage from the  incision. °Increasing abdominal pain ° °The clinic staff is available to answer your questions during regular business hours.  Please don’t hesitate to call and ask to speak to one of the nurses for clinical concerns.  If you have a medical emergency, go to the nearest emergency room or call 911.  A surgeon from [facility] is always on call at the hospital. °[address], [address], [place], [place]  [address] ? [address] [address], [place], [place]   [address] °[phone] ? [phone] ? FAX [phone] ° ° ° ° °Managing Your Pain After Surgery Without Opioids ° ° ° °Thank you for participating in our program to help patients manage their pain after surgery without opioids. This is part of our effort to provide you with the best care possible, without exposing you or your family to the risk that opioids pose. ° °What pain can I expect after surgery? °You can expect to have some pain after surgery. This is normal. The pain is typically worse the day after surgery, and quickly begins to get better. °Many studies have found that many patients are able to manage their pain after surgery with Over-the-Counter (OTC) medications such as Tylenol and Motrin. If you have a condition that does not allow you to take Tylenol or Motrin, notify your surgical team. ° °How will I manage my pain? °The best strategy for controlling your pain after surgery is around the clock pain control with Tylenol (acetaminophen) and Motrin (ibuprofen or Advil). Alternating these medications with each other allows you to maximize your pain control. In addition to Tylenol and Motrin, you can use heating pads or ice packs on your incisions to help reduce your pain. ° °How will I alternate your regular strength over-the-counter pain medication? °You will take a dose of pain medication every three hours. °Start by taking 650 mg of Tylenol (2 pills of 325 mg) °3 hours later take 600 mg of Motrin (3 pills of 200 mg) °3 hours after  taking the Motrin take 650 mg of Tylenol °3 hours after that take 600 mg of Motrin. ° ° °- 1 - ° °See example - if your first dose of Tylenol is at 12:00 PM ° ° °12:00 PM Tylenol 650 mg (2 pills of 325 mg)  °3:00 PM Motrin 600 mg (3 pills of 200 mg)  °6:00 PM Tylenol 650 mg (2 pills of 325 mg)  °9:00 PM Motrin 600 mg (3 pills of 200 mg)  °Continue alternating every 3 hours  ° °We recommend that you follow this schedule around-the-clock for at least 3 days after surgery, or until you feel that it is no longer needed. Use the table on the last page of this handout to keep track of the medications you are taking. °Important: °Do not take more than 3000mg of Tylenol or 3200mg of Motrin in a 24-hour period. °Do not take ibuprofen/Motrin if you have a history of bleeding stomach ulcers, severe kidney disease, &/or actively taking a blood thinner ° °What if I still have pain? °If you have pain that is not controlled with the over-the-counter pain medications (Tylenol and Motrin or Advil) you might have what we call “breakthrough” pain. You will receive a prescription   for a small amount of an opioid pain medication such as Oxycodone, Tramadol, or Tylenol with Codeine. Use these opioid pills in the first 24 hours after surgery if you have breakthrough pain. Do not take more than 1 pill every 4-6 hours. ° °If you still have uncontrolled pain after using all opioid pills, don't hesitate to call our staff using the number provided. We will help make sure you are managing your pain in the best way possible, and if necessary, we can provide a prescription for additional pain medication. ° ° °Day 1   ° °Time  °Name of Medication Number of pills taken  °Amount of Acetaminophen  °Pain Level  ° °Comments  °AM PM       °AM PM       °AM PM       °AM PM       °AM PM       °AM PM       °AM PM       °AM PM       °Total Daily amount of Acetaminophen °Do not take more than  3,000 mg per day    ° ° °Day 2   ° °Time  °Name of Medication  Number of pills °taken  °Amount of Acetaminophen  °Pain Level  ° °Comments  °AM PM       °AM PM       °AM PM       °AM PM       °AM PM       °AM PM       °AM PM       °AM PM       °Total Daily amount of Acetaminophen °Do not take more than  3,000 mg per day    ° ° °Day 3   ° °Time  °Name of Medication Number of pills taken  °Amount of Acetaminophen  °Pain Level  ° °Comments  °AM PM       °AM PM       °AM PM       °AM PM       ° ° ° °AM PM       °AM PM       °AM PM       °AM PM       °Total Daily amount of Acetaminophen °Do not take more than  3,000 mg per day    ° ° °Day 4   ° °Time  °Name of Medication Number of pills taken  °Amount of Acetaminophen  °Pain Level  ° °Comments  °AM PM       °AM PM       °AM PM       °AM PM       °AM PM       °AM PM       °AM PM       °AM PM       °Total Daily amount of Acetaminophen °Do not take more than  3,000 mg per day    ° ° °Day 5   ° °Time  °Name of Medication Number °of pills taken  °Amount of Acetaminophen  °Pain Level  ° °Comments  °AM PM       °AM PM       °AM PM       °AM PM       °AM PM       °AM   PM       °AM PM       °AM PM       °Total Daily amount of Acetaminophen °Do not take more than  3,000 mg per day    ° ° ° °Day 6   ° °Time  °Name of Medication Number of pills °taken  °Amount of Acetaminophen  °Pain Level  °Comments  °AM PM       °AM PM       °AM PM       °AM PM       °AM PM       °AM PM       °AM PM       °AM PM       °Total Daily amount of Acetaminophen °Do not take more than  3,000 mg per day    ° ° °Day 7   ° °Time  °Name of Medication Number of pills taken  °Amount of Acetaminophen  °Pain Level  ° °Comments  °AM PM       °AM PM       °AM PM       °AM PM       °AM PM       °AM PM       °AM PM       °AM PM       °Total Daily amount of Acetaminophen °Do not take more than  3,000 mg per day    ° ° ° ° °For additional information about how and where to safely dispose of unused opioid °medications - https://www.morepowerfulnc.org ° °Disclaimer: This document  contains information and/or instructional materials adapted from [facility] for the typical patient with your condition. It does not replace medical advice from your health care provider because your experience may differ from that of the °typical patient. Talk to your health care provider if you have any questions about this °document, your condition or your treatment plan. °Adapted from [facility] ° °

## 2020-07-14 NOTE — ED Triage Notes (Signed)
Reports severe upper abdominal pain since Monday, 6/9.  Reports one episode of vomiting on Monday evening.  Diarrhea yesterday. Reports some dizziness, lightheadedness, denies SOB.  Denies fever, cough.

## 2020-07-14 NOTE — Anesthesia Postprocedure Evaluation (Signed)
Anesthesia Post Note  Patient: Christina Wong  Procedure(s) Performed: LAPAROSCOPIC CHOLECYSTECTOMY     Patient location during evaluation: PACU Anesthesia Type: General Level of consciousness: awake and alert Pain management: pain level controlled Vital Signs Assessment: post-procedure vital signs reviewed and stable Respiratory status: spontaneous breathing, nonlabored ventilation, respiratory function stable and patient connected to nasal cannula oxygen Cardiovascular status: blood pressure returned to baseline and stable Postop Assessment: no apparent nausea or vomiting Anesthetic complications: no   No notable events documented.  Last Vitals:  Vitals:   07/14/20 1715 07/14/20 1730  BP: (!) 158/79 (!) 153/81  Pulse: 94 89  Resp: 13 12  Temp: 37.2 C   SpO2: 94% 95%    Last Pain:  Vitals:   07/14/20 1730  TempSrc:   PainSc: 3                  Kienna Moncada S

## 2020-07-14 NOTE — ED Notes (Signed)
Report given to Dierdre Searles, CN at [facility] ED

## 2020-07-14 NOTE — Op Note (Signed)
Operative Note  Christina Wong 70 y.o. female [id number]  07/14/2020  Surgeon: Berna Bue MD FACS  Assistant: Barnetta Chapel PA-C  Procedure performed: Laparoscopic Cholecystectomy  Procedure classification: emergent  Preop diagnosis: acute cholecystitis Post-op diagnosis/intraop findings: same, severe with developing hydrops and gallbladder wall necrosis  Specimens: gallbladder  Retained items: none  EBL: 50  Complications: none  Description of procedure: After obtaining informed consent the patient was brought to the operating room. Antibiotics were administered. SCD's were applied. General endotracheal anesthesia was initiated and a formal time-out was performed. The abdomen was prepped and draped in the usual sterile fashion and the abdomen was entered using  a left subcostal veress  after instilling the site with local. Insufflation to was obtained, 51mm trocar and camera inserted with optical entry in the right upper quadrant, and gross inspection revealed no evidence of injury from our entry.  There is an incarcerated supraumbilical hernia as well as adhesions in the upper midline, all of this was left alone.  Two 56mm trocars were introduced, 1 to the left of the umbilicus well away from the incarcerated hernia, and the other in the right anterior axillary line under direct visualization and following infiltration with local. An 34mm trocar was placed in the epigastrium.  The gallbladder was encased in omental adhesions and early adhesions to the pylorus and first part of the duodenum.  These were carefully bluntly swept away.  The gallbladder is tensely distended and severely inflamed with patches of necrotic gallbladder wall.  This was decompressed with the [place], revealing thin slightly bile tinged but mostly seropurulent fluid.  The gallbladder was then retracted cephalad and the infundibulum was retracted laterally. A combination of hook electrocautery and blunt  dissection was utilized to clear the peritoneum from the neck and cystic duct, circumferentially isolating the cystic artery and cystic duct and lifting the gallbladder from the cystic plate. The critical view of safety was achieved with the cystic artery, cystic duct, and liver bed visualized between them with no other structures. The artery was clipped with a single clip proximally and distally and divided as was the cystic duct with three clips on the proximal end. The gallbladder was dissected from the liver plate using electrocautery.  Bleeding was encountered from a posterior branch along the proximal liver bed which was addressed with clips and direct pressure.  The posterior wall of the gallbladder was necrotic and some of the distal gallbladder wall was left on the liver bed.  Once freed the gallbladder was placed in an endocatch bag and removed through the epigastric trocar site. A small amount of bleeding on the liver bed was controlled with cautery, in addition to which the small patch of the posterior gallbladder wall along the distal gallbladder bed was ablated with cautery.  The previous area of bleeding that required clip was closely inspected and confirmed to be hemostatic. Some bile had been spilled from the gallbladder during its dissection from the liver bed. This was aspirated and the right upper quadrant was irrigated copiously until the effluent was clear. Hemostasis was once again confirmed, and reinspection of the abdomen revealed no injuries. The clips were well opposed without any bile leak from the duct or the liver bed.  Surgicel snow was placed in the liver bed.  The 25mm trocar site in the epigastrium was closed with a 0 vicryl in the fascia under direct visualization using a PMI device. The abdomen was desufflated and all trocars removed. The skin  incisions were closed with subcuticular 4-0 monocryl and Dermabond. The patient was awakened, extubated and transported to the recovery  room in stable condition.    All counts were correct at the completion of the case.

## 2020-07-14 NOTE — ED Notes (Signed)
Patient is resting comfortably, stated she would like to sit on side of bed due to tenderness of side after [place]. Cardiac monitor placed back on pt.

## 2020-07-14 NOTE — ED Notes (Signed)
Patient transported to CT 

## 2020-07-14 NOTE — Anesthesia Procedure Notes (Signed)
Procedure Name: Intubation Date/Time: 07/14/2020 3:11 PM Performed by: Niel Hummer, CRNA Pre-anesthesia Checklist: Patient identified, Emergency Drugs available, Suction available and Patient being monitored Patient Re-evaluated:Patient Re-evaluated prior to induction Oxygen Delivery Method: Circle system utilized Preoxygenation: Pre-oxygenation with 100% oxygen Induction Type: IV induction and Rapid sequence Laryngoscope Size: Mac and 4 Grade View: Grade I Tube type: Oral Tube size: 7.0 mm Number of attempts: 1 Airway Equipment and Method: Stylet Placement Confirmation: ETT inserted through vocal cords under direct vision, positive ETCO2 and breath sounds checked- equal and bilateral Secured at: 21 cm Tube secured with: Tape Dental Injury: Teeth and Oropharynx as per pre-operative assessment

## 2020-07-14 NOTE — Transfer of Care (Signed)
Immediate Anesthesia Transfer of Care Note  Patient: Christina Wong  Procedure(s) Performed: LAPAROSCOPIC CHOLECYSTECTOMY  Patient Location: PACU  Anesthesia Type:General  Level of Consciousness: awake, alert  and oriented  Airway & Oxygen Therapy: Patient Spontanous Breathing and Patient connected to face mask oxygen  Post-op Assessment: Report given to RN, Post -op Vital signs reviewed and stable and Patient moving all extremities X 4  Post vital signs: Reviewed and stable  Last Vitals:  Vitals Value Taken Time  BP    Temp    Pulse    Resp    SpO2      Last Pain:  Vitals:   07/14/20 1447  TempSrc: Oral  PainSc:          Complications: No notable events documented.

## 2020-07-14 NOTE — H&P (Signed)
Admission Note  DNASIA GAUNA 09-Jan-1951  [id number].    Requesting MD: Gwyneth Sprout, MD Chief Complaint/Reason for Consult: Abdominal pain  HPI:  Patient is a 70 year old female who presented to [facility] with 4 day history of abdominal pain. Pain is located in upper abdomen and has progressively worsened and migrated to her RUQ. She rates pain as 9/10 and describes it as a deep aching sensation. She tried taking pepto bismol without relief. Pain seemed to be exacerbated by eating yesterday. She also has associated nausea, vomiting and 1 episode of diarrhea. Denies fever, chills, chest pain, SBO, urinary symptoms. PMH otherwise significant for HTN, HLD, T2DM, hx of TIA in 2015. Past abdominal surgery includes prior laparoscopies related to infertility. Patient was previously on plavix but reports she is no longer taking this medication. Allergies listed to sulfa and lipitor.   She presented to [facility] today for evaluation.  She was found to have acute cholecystitis on imaging as well as a WBC of 13K.   ROS: Review of Systems  Constitutional:  Negative for chills and fever.  Respiratory:  Negative for shortness of breath and wheezing.   Cardiovascular:  Negative for chest pain and palpitations.  Gastrointestinal:  Positive for abdominal pain, diarrhea, nausea and vomiting. Negative for blood in stool, constipation and melena.  Genitourinary:  Negative for dysuria, frequency and urgency.  All other systems reviewed and are negative.  Family History  Problem Relation Age of Onset   Heart Problems Mother        aortic valve replacement   Diabetes Father    Hypertension Father    CVA Father    Hypertension Paternal Grandmother    CVA Paternal Grandmother    Breast cancer Maternal Aunt     Past Medical History:  Diagnosis Date   Aphasia 07/11/13   x 10-15 min   Elevated liver function tests    One of her studies are slightly elevated which may represent fatty liver from her  Diabetes and her obesity or may represent an adverse effect from the Crestor.   Exogenous obesity    She has had a problem with   Headache(784.0)    "daily to weekly lately" (09/02/2013)   Hypercholesterolemia    Hypertension    hx of essential- on meds   Infertility    Leg cramps    in last 6 months   Neck stiffness 07/13/14   and neck pain   TIA (transient ischemic attack) 09/01/2013   "that's what they think"   Type II diabetes mellitus (HCC)    mild   Walking pneumonia ~ 1972    Past Surgical History:  Procedure Laterality Date   ANKLE FRACTURE SURGERY Left ~ 2007   ANTERIOR CERVICAL DECOMP/DISCECTOMY FUSION  2001   BREAST BIOPSY     CARDIOVASCULAR STRESS TEST  06-29-2002   EF is normal at 55%   DILATION AND CURETTAGE OF UTERUS     FOOT SURGERY Left 02/2014   KNEE ARTHROSCOPY Left 1980's   KNEE ARTHROSCOPY WITH MENISCAL REPAIR Right 2/14   LAPAROSCOPY     multiple s/p infertility   PILONIDAL CYST EXCISION     TONSILLECTOMY  1960's    Social History:  reports that she has never smoked. She has never used smokeless tobacco. She reports current alcohol use of about 7.0 standard drinks of alcohol per week. She reports that she does not use drugs.  Allergies:  Allergies  Allergen Reactions  Lipitor [Atorvastatin] Other (See Comments)    Leg cramps   Onion Nausea And Vomiting   Bactrim [Sulfamethoxazole-Trimethoprim] Other (See Comments)    Muscle issues   Sulfa Antibiotics Other (See Comments)    unknown    (Not in a hospital admission)   Blood pressure 134/80, pulse (!) 106, temperature 98.9 F (37.2 C), temperature source Oral, resp. rate 20, height  (1.575 m), weight 90.7 kg, last menstrual period 02/05/2006, SpO2 96 %. Physical Exam:  General: pleasant, WD, obese female who is sitting up in a chair in NAD HEENT: head is normocephalic, atraumatic.  Sclera are anicteric.  PERRL.  Ears and nose without any masses or lesions.  Mouth is pink and moist Heart:  regular, rate, and rhythm.  Normal s1,s2. No obvious murmurs, gallops, or rubs noted.  Palpable radial and pedal pulses bilaterally Lungs: CTAB, no wheezes, rhonchi, or rales noted.  Respiratory effort nonlabored Abd: soft, ttp in RUQ, + Murphy sign, ND, +BS, no masses, hernias, or organomegaly MS: all 4 extremities are symmetrical with no cyanosis, clubbing, or edema. Skin: warm and dry with no masses, lesions, or rashes Neuro: Cranial nerves 2-12 grossly intact, sensation is normal throughout Psych: A&Ox3 with an appropriate affect.   Results for orders placed or performed during the hospital encounter of 07/14/20 (from the past 48 hour(s))  CBC with Differential/Platelet     Status: Abnormal   Collection Time: 07/14/20  9:16 AM  Result Value Ref Range   WBC 13.3 (H) 4.0 - 10.5 K/uL   RBC 5.15 (H) 3.87 - 5.11 MIL/uL   Hemoglobin 15.5 (H) 12.0 - 15.0 g/dL   HCT 40.9 (H) 81.1 - 91.4 %   MCV 89.9 80.0 - 100.0 fL   MCH 30.1 26.0 - 34.0 pg   MCHC 33.5 30.0 - 36.0 g/dL   RDW 78.2 95.6 - 21.3 %   Platelets 175 150 - 400 K/uL   nRBC 0.0 0.0 - 0.2 %   Neutrophils Relative % 86 %   Neutro Abs 11.5 (H) 1.7 - 7.7 K/uL   Lymphocytes Relative 7 %   Lymphs Abs 0.9 0.7 - 4.0 K/uL   Monocytes Relative 6 %   Monocytes Absolute 0.8 0.1 - 1.0 K/uL   Eosinophils Relative 0 %   Eosinophils Absolute 0.0 0.0 - 0.5 K/uL   Basophils Relative 0 %   Basophils Absolute 0.1 0.0 - 0.1 K/uL   Immature Granulocytes 1 %   Abs Immature Granulocytes 0.06 0.00 - 0.07 K/uL    Comment: Performed at [facility], [address] [place], [place] [address]  Comprehensive metabolic panel     Status: Abnormal   Collection Time: 07/14/20  9:16 AM  Result Value Ref Range   Sodium 137 135 - 145 mmol/L   Potassium 3.9 3.5 - 5.1 mmol/L   Chloride 102 98 - 111 mmol/L   CO2 24 22 - 32 mmol/L   Glucose, Bld 146 (H) 70 - 99 mg/dL    Comment: Glucose reference range applies only to samples taken after fasting  for at least 8 hours.   BUN 14 8 - 23 mg/dL   Creatinine, Ser 8.46 0.44 - 1.00 mg/dL   Calcium 96.2 8.9 - 95.2 mg/dL   Total Protein 7.9 6.5 - 8.1 g/dL   Albumin 4.2 3.5 - 5.0 g/dL   AST 17 15 - 41 U/L   ALT 21 0 - 44 U/L   Alkaline Phosphatase 76 38 - 126  U/L   Total Bilirubin 1.1 0.3 - 1.2 mg/dL   GFR, Estimated >16>60 >10>60 mL/min    Comment: (NOTE) Calculated using the CKD-EPI Creatinine Equation (2021)    Anion gap 11 5 - 15    Comment: Performed at [facility] [place], [address] [place], [place] [address]  Lipase, blood     Status: None   Collection Time: 07/14/20  9:16 AM  Result Value Ref Range   Lipase 35 11 - 51 U/L    Comment: Performed at [facility] [place], [address] [place], [place] [address]  Urinalysis, Routine w reflex microscopic Urine, Clean Catch     Status: Abnormal   Collection Time: 07/14/20 11:03 AM  Result Value Ref Range   Color, Urine YELLOW YELLOW   APPearance CLEAR CLEAR   Specific Gravity, Urine 1.025 1.005 - 1.030   pH 6.0 5.0 - 8.0   Glucose, UA NEGATIVE NEGATIVE mg/dL   Hgb urine dipstick TRACE (A) NEGATIVE   Bilirubin Urine NEGATIVE NEGATIVE   Ketones, ur 40 (A) NEGATIVE mg/dL   Protein, ur NEGATIVE NEGATIVE mg/dL   Nitrite NEGATIVE NEGATIVE   Leukocytes,Ua SMALL (A) NEGATIVE    Comment: Performed at [facility] [place], [address] [place], [place] [address]  Urinalysis, Microscopic (reflex)     Status: Abnormal   Collection Time: 07/14/20 11:03 AM  Result Value Ref Range   RBC / HPF 0-5 0 - 5 RBC/hpf   WBC, UA 6-10 0 - 5 WBC/hpf   Bacteria, UA FEW (A) NONE SEEN   Squamous Epithelial / LPF 0-5 0 - 5   Mucus PRESENT     Comment: Performed at [facility] [place], [address] [place], [place] [address]  Resp Panel by RT-PCR (Flu A&B, Covid) Nasopharyngeal Swab     Status: None   Collection Time: 07/14/20 12:40 PM   Specimen: Nasopharyngeal Swab; Nasopharyngeal(NP) swabs in vial transport medium  Result  Value Ref Range   SARS Coronavirus 2 by RT PCR NEGATIVE NEGATIVE    Comment: (NOTE) SARS-CoV-2 target nucleic acids are NOT DETECTED.  The SARS-CoV-2 RNA is generally detectable in upper respiratory specimens during the acute phase of infection. The lowest concentration of SARS-CoV-2 viral copies this assay can detect is 138 copies/mL. A negative result does not preclude SARS-Cov-2 infection and should not be used as the sole basis for treatment or other patient management decisions. A negative result may occur with  improper specimen collection/handling, submission of specimen other than nasopharyngeal swab, presence of viral mutation(s) within the areas targeted by this assay, and inadequate number of viral copies(<138 copies/mL). A negative result must be combined with clinical observations, patient history, and epidemiological information. The expected result is Negative.  Fact Sheet for Patients:  BloggerCourse.comhttps://www.fda.gov/media/152166/download  Fact Sheet for Healthcare Providers:  SeriousBroker.ithttps://www.fda.gov/media/152162/download  This test is no t yet approved or cleared by the [place] FDA and  has been authorized for detection and/or diagnosis of SARS-CoV-2 by FDA under an Emergency Use Authorization (EUA). This EUA will remain  in effect (meaning this test can be used) for the duration of the COVID-19 declaration under Section 564(b)(1) of the Act, 21 U.S.C.section 360bbb-3(b)(1), unless the authorization is terminated  or revoked sooner.       Influenza A by PCR NEGATIVE NEGATIVE   Influenza B by PCR NEGATIVE NEGATIVE    Comment: (NOTE) The Xpert Xpress SARS-CoV-2/FLU/RSV plus assay is intended as an aid in  the diagnosis of influenza from Nasopharyngeal swab specimens and should not be used as a sole basis for treatment. Nasal washings and aspirates are unacceptable for Xpert Xpress SARS-CoV-2/FLU/RSV testing.  Fact Sheet for  Patients: BloggerCourse.com  Fact Sheet for Healthcare Providers: SeriousBroker.it  This test is not yet approved or cleared by the [place] FDA and has been authorized for detection and/or diagnosis of SARS-CoV-2 by FDA under an Emergency Use Authorization (EUA). This EUA will remain in effect (meaning this test can be used) for the duration of the COVID-19 declaration under Section 564(b)(1) of the Act, 21 U.S.C. section 360bbb-3(b)(1), unless the authorization is terminated or revoked.  Performed at [facility], [address] [place], [place] [address]    CT ABDOMEN PELVIS W CONTRAST  Result Date: 07/14/2020 CLINICAL DATA:  Acute generalized abdominal pain, vomiting. EXAM: CT ABDOMEN AND PELVIS WITH CONTRAST TECHNIQUE: Multidetector CT imaging of the abdomen and pelvis was performed using the standard protocol following bolus administration of intravenous contrast. CONTRAST:  65mL OMNIPAQUE IOHEXOL 300 MG/ML  SOLN COMPARISON:  None. FINDINGS: Lower chest: Minimal right middle lobe subsegmental atelectasis is noted. Hepatobiliary: Small gallstone is noted in the neck of the gallbladder. Distention of gallbladder is noted with wall thickening and surrounding inflammatory changes concerning for acute cholecystitis. No biliary dilatation is noted. The liver is unremarkable. Pancreas: Unremarkable. No pancreatic ductal dilatation or surrounding inflammatory changes. Spleen: Normal in size without focal abnormality. Adrenals/Urinary Tract: Adrenal glands are unremarkable. Kidneys are normal, without renal calculi, focal lesion, or hydronephrosis. Bladder is unremarkable. Stomach/Bowel: Stomach is within normal limits. Appendix appears normal. No evidence of bowel wall thickening, distention, or inflammatory changes. Vascular/Lymphatic: Aortic atherosclerosis. No enlarged abdominal or pelvic lymph nodes. Reproductive: Uterus and  bilateral adnexa are unremarkable. Other: Small fat containing periumbilical hernia is noted. No ascites is noted. Musculoskeletal: No acute or significant osseous findings. IMPRESSION: Small gallstone is noted in the neck of the gallbladder, with gallbladder distention and wall thickening and surrounding inflammatory changes consistent with acute cholecystitis. Small fat containing periumbilical hernia. Aortic Atherosclerosis (ICD10-I70.0). Electronically Signed   By: Lupita Raider M.D.   On: 07/14/2020 12:20   US Abdomen Limited RUQ (LIVER/GB)  Result Date: 07/14/2020 CLINICAL DATA:  Right quadrant pain with nausea and vomiting EXAM: ULTRASOUND ABDOMEN LIMITED RIGHT UPPER QUADRANT COMPARISON:  None. FINDINGS: Gallbladder: Gallbladder is distended. No gallstones, gallbladder wall thickening, or pericholecystic fluid. No sonographic Murphy sign noted by sonographer. Common bile duct: Diameter: 3 mm. Note that most of the common bile duct is obscured by gas. Liver: No focal lesion identified. Within normal limits in parenchymal echogenicity. Portal vein is patent on color Doppler imaging with normal direction of blood flow towards the liver. Other: None. IMPRESSION: Gallbladder distended. No gallstones, gallbladder wall thickening, or pericholecystic fluid. No visualization of the extrahepatic biliary ductal system without dilatation noted. Electronically Signed   By: Bretta Bang III M.D.   On: 07/14/2020 10:24      Assessment/Plan Acute cholecystitis  - CT/US with small gallstone in the neck of the gallbladder and gallbladder wall thickening and pericholecystic inflammation - WBC 13.3, Tmax 100.7 - LFTs otherwise normal - to OR for laparoscopic cholecystectomy today  -likely home from PACU per patient request.  This is reasonable as long as everything with surgery goes well.  She knows she will be admitted for observation if there are any issues with this.   FEN: NPO, IVF VTE: SCDs ID:  [id number] 6/9>>  T2DM HTN HLD Hx of TIA in 2015  Admit to observation post-operatively if unable to discharge from the PACU.   Letha Cape, [facility] 07/14/2020, 1:55 PM Please see Amion for pager number during day hours 7:00am-4:30pm

## 2020-07-14 NOTE — ED Provider Notes (Signed)
[facility] EMERGENCY DEPARTMENT Provider Note   CSN: [id number] Arrival date & time: 07/14/20  0825     History Chief Complaint  Patient presents with   Abdominal Pain    Christina Wong is a 70 y.o. female.  Patient is a 70 year old female with a history of hypertension, hypercholesterolemia, diabetes, prior fatty liver who is presenting today with 4 days of abdominal pain.  Patient woke up on Monday and reports she was not feeling well but throughout the day developed more significant pain in her upper abdomen and then had 1 episode of vomiting.  The vomiting did not relieve the pain and she continued to have discomfort and intermittent nausea.  Yesterday when she woke up she felt a little bit better and thought that maybe she had just had a virus and she was on the way to improving but then she ate a little bit of chicken and rice and reports that the upper abdominal pain became significantly worse.  She had 1 diarrhea stool yesterday.  She denies any fever, shortness of breath, cough, congestion.  Eating seems to make the pain worse and sometimes movement.  It occasionally will radiate around to the right side.  She has had no abdominal surgeries other than laparoscopy with infertility issues years ago.  She has tried taking Tums and Pepto-Bismol at home without improvement.  Currently the pain is a 9 out of 10 and deep and aching in nature.  The history is provided by the patient.  Abdominal Pain     Past Medical History:  Diagnosis Date   Aphasia 07/11/13   x 10-15 min   Elevated liver function tests    One of her studies are slightly elevated which may represent fatty liver from her Diabetes and her obesity or may represent an adverse effect from the Crestor.   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  03/21/2017   Migraine equivalent 03/09/2014   TIA (transient ischemic attack) 09/01/2013   Type II or unspecified type diabetes mellitus without mention of complication, uncontrolled 08/23/2010   Dyslipidemia 08/23/2010   Benign hypertensive heart disease without heart failure 08/23/2010    Past Surgical History:  Procedure Laterality Date   ANKLE FRACTURE SURGERY Left ~ 2007   ANTERIOR CERVICAL DECOMP/DISCECTOMY FUSION  2001   BREAST BIOPSY     CARDIOVASCULAR STRESS TEST  06-29-2002   EF is normal at 55%   DILATION AND CURETTAGE OF UTERUS     FOOT SURGERY Left 02/2014   KNEE ARTHROSCOPY Left 1980's   KNEE ARTHROSCOPY WITH MENISCAL REPAIR Right 2/14   LAPAROSCOPY     multiple s/p infertility   PILONIDAL CYST EXCISION     TONSILLECTOMY  1960's     OB History     Gravida  1   Para  1   Term  Preterm      AB      Living  1      SAB      IAB      Ectopic      Multiple      Live Births           Obstetric Comments  And one adopted child         Family History  Problem Relation Age of Onset   Heart Problems Mother        aortic valve replacement   Diabetes Father    Hypertension Father    CVA Father    Hypertension Paternal Grandmother    CVA Paternal Grandmother    Breast cancer Maternal Aunt     Social History   Tobacco Use   Smoking status: Never   Smokeless tobacco: Never  Substance Use Topics   Alcohol use: Yes    Alcohol/week: 7.0 standard drinks    Types: 7 Glasses of wine per week   Drug use: No    Home Medications Prior to Admission medications   Medication Sig Start Date End Date Taking? Authorizing Provider  B Complex  Vitamins (VITAMIN B COMPLEX PO) Take by mouth.    [provider]  Calcium Carbonate (CALCIUM 600 PO) Take by mouth 2 (two) times daily.    [provider]  clobetasol (TEMOVATE) 0.05 % external solution  03/17/18   [provider]  clopidogrel (PLAVIX) 75 MG tablet Take 1 tablet (75 mg total) by mouth daily. 09/18/16   Micki Riley, MD  clotrimazole-betamethasone (LOTRISONE) cream Apply 1 application topically as needed.    [provider]  Dulaglutide (TRULICITY) 1.5 MG/0.5ML SOPN Inject into the skin as directed.    [provider]  losartan (COZAAR) 50 MG tablet Take 50 mg by mouth daily.    [provider]  metFORMIN (GLUCOPHAGE) 500 MG tablet Take 500 mg by mouth 2 (two) times daily with a meal.    [provider]  metroNIDAZOLE (METROGEL) 1 % gel  03/18/18   [provider]  Multiple Vitamins-Minerals (HAIR SKIN AND NAILS FORMULA PO) Take by mouth daily.    [provider]  nitrofurantoin, macrocrystal-monohydrate, (MACROBID) 100 MG capsule Take 1 capsule (100 mg total) by mouth 2 (two) times daily. 11/19/18   Jerene Bears, MD  phenazopyridine (PYRIDIUM) 100 MG tablet Take 1 tablet (100 mg total) by mouth 3 (three) times daily as needed for pain. 11/19/18   Jerene Bears, MD  potassium chloride SA (K-DUR,KLOR-CON) 20 MEQ tablet Take 20 mEq by mouth 2 (two) times daily.    [provider]  rosuvastatin (CRESTOR) 5 MG tablet Take 5 mg by mouth every morning.    [provider]  traMADol (ULTRAM) 50 MG tablet Take 1 tablet (50 mg total) by mouth every 4 (four) hours as needed. 04/05/17   Nyoka Cowden, MD  Triamcinolone Acetonide (NASACORT AQ NA) Place 2 sprays into the nose daily.    [provider]  VITAMIN D PO Take 1,000 Units by mouth daily.    [provider]  zolpidem (AMBIEN) 10 MG tablet Take 10 mg by mouth at bedtime. TAKING 1/2 TABLET AT BEDTIME    [provider]  Saxagliptin HCl (ONGLYZA PO) Take 1,000 mg by mouth.    04/13/11  [provider]    Allergies    Lipitor [atorvastatin], Onion, Bactrim [sulfamethoxazole-trimethoprim], and Sulfa antibiotics  Review of Systems  Review of Systems  Gastrointestinal:  Positive for abdominal pain.  All other systems reviewed and are negative.  Physical Exam Updated Vital Signs BP (!) 145/87 (BP Location: Right Arm)   Pulse 85   Temp 98.2 F (36.8 C) (Oral)   Resp 16   Ht 5\' 2"  (1.575 m)   Wt 90.7 kg   LMP 02/05/2006   SpO2 96%   BMI 36.58 kg/m   Physical Exam Vitals and nursing note reviewed.  Constitutional:      General: She is not in acute distress.    Appearance: She is well-developed.  HENT:     Head: Normocephalic and atraumatic.     Mouth/Throat:     Mouth: Mucous membranes are dry.  Eyes:     Pupils: Pupils are equal, round, and reactive to light.  Cardiovascular:     Rate and Rhythm: Normal rate and regular rhythm.     Heart sounds: Normal heart sounds. No murmur heard.   No friction rub.  Pulmonary:     Effort: Pulmonary effort is normal.     Breath sounds: Normal breath sounds. No wheezing or rales.  Abdominal:     General: Bowel sounds are decreased. There is no distension.     Palpations: Abdomen is soft.     Tenderness: There is abdominal tenderness in the right upper quadrant. There is guarding. There is no rebound. Positive signs include Murphy's sign.  Musculoskeletal:        General: No tenderness. Normal range of motion.     Right lower leg: No edema.     Left lower leg: No edema.     Comments: No edema  Skin:    General: Skin is warm and dry.     Findings: No rash.  Neurological:     Mental Status: She is alert and oriented to person, place, and time. Mental status is at baseline.     Cranial Nerves: No cranial nerve deficit.  Psychiatric:        Mood and Affect: Mood normal.        Behavior: Behavior normal.    ED Results /  Procedures / Treatments   Labs (all labs ordered are listed, but only abnormal results are displayed) Labs Reviewed  CBC WITH DIFFERENTIAL/PLATELET - Abnormal; Notable for the following components:      Result Value   WBC 13.3 (*)    RBC 5.15 (*)    Hemoglobin 15.5 (*)    HCT 46.3 (*)    Neutro Abs 11.5 (*)    All other components within normal limits  COMPREHENSIVE METABOLIC PANEL - Abnormal; Notable for the following components:   Glucose, Bld 146 (*)    All other components within normal limits  LIPASE, BLOOD    EKG EKG Interpretation  Date/Time:  Thursday July 14 2020 08:41:10 EDT Ventricular Rate:  85 PR Interval:  129 QRS Duration: 93 QT Interval:  371 QTC Calculation: 442 R Axis:   -2 Text Interpretation: Sinus rhythm Abnormal R-wave progression, early transition new Left ventricular hypertrophy Confirmed by 06-08-1988 (Gwyneth Sprout) on 07/14/2020 8:43:48 AM  Radiology CT ABDOMEN PELVIS W CONTRAST  Result Date: 07/14/2020 CLINICAL DATA:  Acute generalized abdominal pain, vomiting. EXAM: CT ABDOMEN AND PELVIS WITH CONTRAST TECHNIQUE: Multidetector CT imaging of the abdomen and pelvis was performed using the standard protocol following bolus administration of intravenous contrast. CONTRAST:  70mL OMNIPAQUE IOHEXOL 300 MG/ML  SOLN COMPARISON:  None. FINDINGS: Lower chest: Minimal right middle lobe subsegmental  atelectasis is noted. Hepatobiliary: Small gallstone is noted in the neck of the gallbladder. Distention of gallbladder is noted with wall thickening and surrounding inflammatory changes concerning for acute cholecystitis. No biliary dilatation is noted. The liver is unremarkable. Pancreas: Unremarkable. No pancreatic ductal dilatation or surrounding inflammatory changes. Spleen: Normal in size without focal abnormality. Adrenals/Urinary Tract: Adrenal glands are unremarkable. Kidneys are normal, without renal calculi, focal lesion, or hydronephrosis. Bladder is unremarkable.  Stomach/Bowel: Stomach is within normal limits. Appendix appears normal. No evidence of bowel wall thickening, distention, or inflammatory changes. Vascular/Lymphatic: Aortic atherosclerosis. No enlarged abdominal or pelvic lymph nodes. Reproductive: Uterus and bilateral adnexa are unremarkable. Other: Small fat containing periumbilical hernia is noted. No ascites is noted. Musculoskeletal: No acute or significant osseous findings. IMPRESSION: Small gallstone is noted in the neck of the gallbladder, with gallbladder distention and wall thickening and surrounding inflammatory changes consistent with acute cholecystitis. Small fat containing periumbilical hernia. Aortic Atherosclerosis (ICD10-I70.0). Electronically Signed   By: Lupita RaiderJames  Green Jr M.D.   On: 07/14/2020 12:20   US Abdomen Limited RUQ (LIVER/GB)  Result Date: 07/14/2020 CLINICAL DATA:  Right quadrant pain with nausea and vomiting EXAM: ULTRASOUND ABDOMEN LIMITED RIGHT UPPER QUADRANT COMPARISON:  None. FINDINGS: Gallbladder: Gallbladder is distended. No gallstones, gallbladder wall thickening, or pericholecystic fluid. No sonographic Murphy sign noted by sonographer. Common bile duct: Diameter: 3 mm. Note that most of the common bile duct is obscured by gas. Liver: No focal lesion identified. Within normal limits in parenchymal echogenicity. Portal vein is patent on color Doppler imaging with normal direction of blood flow towards the liver. Other: None. IMPRESSION: Gallbladder distended. No gallstones, gallbladder wall thickening, or pericholecystic fluid. No visualization of the extrahepatic biliary ductal system without dilatation noted. Electronically Signed   By: Bretta BangWilliam  Woodruff III M.D.   On: 07/14/2020 10:24    Procedures Procedures   Medications Ordered in ED Medications  lactated ringers bolus 1,000 mL (has no administration in time range)  ondansetron (ZOFRAN) injection 4 mg (has no administration in time range)  fentaNYL (SUBLIMAZE)  injection 50 mcg (has no administration in time range)    ED Course  I have reviewed the triage vital signs and the nursing notes.  Pertinent labs & imaging results that were available during my care of the patient were reviewed by me and considered in my medical decision making (see chart for details).    MDM Rules/Calculators/A&P                          Elderly female presenting today with abdominal pain that is now day 4.  The pain has been progressive and worsening.  Patient reports that it is in the epigastric and periumbilical region however on exam she has most significant tenderness in the right upper quadrant.  Patient denies symptoms concerning for cardiac or respiratory pathology.  EKG does show some evidence of LVH compared to her prior EKG in 2015 but no other significant findings.  Low suspicion for pneumonia, ACS, PE.  Low suspicion that this is musculoskeletal.  Suspect most likely cholecystitis.  Also possibility for hepatitis, pancreatitis or obstruction.  Low suspicion for appendicitis, urinary issue or diverticulitis at this time.  Patient given IV fluids, pain and nausea control.  Labs and imaging are pending.  10:51 AM Patient CBC with a leukocytosis of 13,000, CMP and lipase are within normal limits.  Ultrasound shows distended gallbladder but no other significant findings.  Patient now  has a temperature of 100.7 and did have improvement of her pain initially with fentanyl but now reports the pain is returning.  She was given a second dose of fentanyl and Tylenol for the fever.  We will do a CT to look for any other acute issues.  12:38 PM CT shows a gallstone in the neck of the gallbladder with surrounding fluid and evidence of cholecystitis.  Patient case discussed with Dr. Eustaquio Boyden with general surgery.  Patient will be transferred by private vehicle to [facility].  She will receive her Rocephin upon arrival to the emergency room.  COVID is pending.  Final Clinical  Impression(s) / ED Diagnoses Final diagnoses:  RUQ abdominal pain  Cholecystitis    Rx / DC Orders ED Discharge Orders     None        Gwyneth Sprout, MD 07/14/20 1240

## 2020-07-14 NOTE — ED Provider Notes (Addendum)
Patient has been transferred from [facility] for cholecystitis.  I have reviewed chart.  Patient is not to room yet.  Will see in the waiting room if necessary. Physical Exam  BP 134/80 (BP Location: Left Arm)   Pulse (!) 106   Temp 98.9 F (37.2 C) (Oral)   Resp 20   Ht 5\' 2"  (1.575 m)   Wt 90.7 kg   LMP 02/05/2006   SpO2 96%   BMI 36.58 kg/m   Physical Exam Constitutional:      Comments: nontoxic clear mental status  Cardiovascular:     Rate and Rhythm: Normal rate and regular rhythm.  Pulmonary:     Effort: Pulmonary effort is normal.     Breath sounds: Normal breath sounds.  Abdominal:     Tenderness: There is abdominal tenderness.  Musculoskeletal:     Right lower leg: No edema.     Left lower leg: No edema.  Skin:    General: Skin is warm and dry.  Neurological:     General: No focal deficit present.     Mental Status: She is alert and oriented to person, place, and time.    ED Course/Procedures     Procedures  MDM  Chart is reviewed.  Rocephin ordered as indicated.  Fluids, pain medication and nausea medications ordered.  Will notify general surgery of the patient's arrival to the emergency department.  General surgery at bedside making plans for the OR.       04/06/2006, MD 07/14/20 1352    09/13/20, MD 07/14/20 1400

## 2020-07-15 ENCOUNTER — Encounter (HOSPITAL_COMMUNITY): Payer: Self-pay | Admitting: Surgery

## 2020-07-15 DIAGNOSIS — K810 Acute cholecystitis: Secondary | ICD-10-CM | POA: Diagnosis not present

## 2020-07-15 LAB — CBC
HCT: 38 % (ref 36.0–46.0)
Hemoglobin: 12.2 g/dL (ref 12.0–15.0)
MCH: 29.9 pg (ref 26.0–34.0)
MCHC: 32.1 g/dL (ref 30.0–36.0)
MCV: 93.1 fL (ref 80.0–100.0)
Platelets: 163 10*3/uL (ref 150–400)
RBC: 4.08 MIL/uL (ref 3.87–5.11)
RDW: 13.4 % (ref 11.5–15.5)
WBC: 16 10*3/uL — ABNORMAL HIGH (ref 4.0–10.5)
nRBC: 0 % (ref 0.0–0.2)

## 2020-07-15 LAB — BASIC METABOLIC PANEL
Anion gap: 6 (ref 5–15)
BUN: 11 mg/dL (ref 8–23)
CO2: 28 mmol/L (ref 22–32)
Calcium: 9.1 mg/dL (ref 8.9–10.3)
Chloride: 104 mmol/L (ref 98–111)
Creatinine, Ser: 0.57 mg/dL (ref 0.44–1.00)
GFR, Estimated: >60 mL/min (ref >60)
Glucose, Bld: 212 mg/dL — ABNORMAL HIGH (ref 70–99)
Potassium: 4.6 mmol/L (ref 3.5–5.1)
Sodium: 138 mmol/L (ref 135–145)

## 2020-07-15 LAB — GLUCOSE, CAPILLARY: Glucose-Capillary: 158 mg/dL — ABNORMAL HIGH (ref 70–99)

## 2020-07-15 LAB — HIV ANTIBODY (ROUTINE TESTING W REFLEX): HIV Screen 4th Generation wRfx: NONREACTIVE

## 2020-07-15 MED ORDER — METHOCARBAMOL 500 MG PO TABS: 500.0000 mg | ORAL_TABLET | Freq: Three times a day (TID) | ORAL | 0 refills | Status: DC | PRN

## 2020-07-15 MED ORDER — ACETAMINOPHEN 500 MG PO TABS: 1000.0000 mg | ORAL_TABLET | Freq: Four times a day (QID) | ORAL | 0 refills | Status: DC | PRN

## 2020-07-15 MED ORDER — OXYCODONE HCL 5 MG PO TABS: 5.0000 mg | ORAL_TABLET | ORAL | 0 refills | Status: DC | PRN

## 2020-07-15 NOTE — Discharge Summary (Signed)
Patient ID: Christina Wong [id number] 05/23/1950 70 y.o.  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 Pain is located in upper abdomen and has progressively worsened and migrated to her RUQ. She rates pain as 9/10 and describes it as a deep aching sensation. She tried taking pepto bismol without relief. Pain seemed to be exacerbated by eating yesterday. She also has associated nausea, vomiting and 1 episode of diarrhea. Denies fever, chills, chest pain, SBO, urinary symptoms. PMH otherwise significant for HTN, HLD, T2DM, hx of TIA in 2015. Past abdominal surgery includes prior laparoscopies related to infertility. Patient was previously on plavix but reports she is no longer taking this medication. Allergies listed to sulfa and lipitor.    She presented to [facility] today for evaluation.  She was found to have acute cholecystitis on imaging as well as a WBC of 13K.  Procedures Lap chole, Dr. Fredricka Bonine 6/9  Hospital Course:  The patient was admitted and underwent a laparoscopic cholecystectomy.  The patient tolerated the procedure well.  On POD 1, the patient was tolerating a regular diet, voiding well, mobilizing, and pain was controlled with oral pain medications.  The patient was stable for DC home at this time with appropriate follow up made.   Physical  Exam: Abd: soft, appropriately tender, +BS, incisions c/d/I with dermabond present  Allergies as of 07/15/2020       Reactions   Lipitor [atorvastatin] Other (See Comments)   Leg cramps   Onion Nausea And Vomiting   Bactrim [sulfamethoxazole-trimethoprim] Other (See Comments)   Muscle issues   Sulfa Antibiotics Other (See Comments)   unknown        Medication List     TAKE these medications    acetaminophen 500 MG tablet Commonly known as: TYLENOL Take 2 tablets (1,000 mg total) by mouth every 6 (six) hours as needed.   CALCIUM 600 PO Take 600 mg by mouth 2 (two) times daily.   clobetasol 0.05 % external solution Commonly known as: TEMOVATE Apply 1 application topically daily as needed.   Dulaglutide 1.5 MG/0.5ML Sopn Inject 1.5 mg into the skin once a week.   HAIR SKIN AND NAILS FORMULA PO Take 1 tablet by mouth daily.   losartan 50 MG tablet Commonly known as: COZAAR Take 50 mg by mouth daily.   metFORMIN 500 MG tablet Commonly known as: GLUCOPHAGE Take 500 mg by mouth 2 (two) times daily with a meal.   methocarbamol 500 MG tablet Commonly known as: ROBAXIN Take 1 tablet (500 mg total) by mouth every 8 (eight) hours as needed for muscle spasms.   NASACORT AQ NA Place 2 sprays into the nose daily.   oxyCODONE 5 MG immediate release tablet Commonly known as: Oxy IR/ROXICODONE Take 1 tablet (5 mg total) by mouth every 4 (four) hours as needed for moderate pain.   potassium chloride SA 20 MEQ tablet Commonly known as:  KLOR-CON Take 20 mEq by mouth 2 (two) times daily.   rosuvastatin 5 MG tablet Commonly known as: CRESTOR Take 5 mg by mouth every morning.   VITAMIN B COMPLEX PO Take 1 tablet by mouth daily.   VITAMIN D PO Take 1,000 Units by mouth daily.   zolpidem 10 MG tablet Commonly known as: AMBIEN Take 5 mg by mouth at bedtime.          Follow-up Information     Surgery, [facility] Follow up on 08/04/2020.   Specialty: General  Surgery Why: 9:45 am, Arrive 30 minutes prior to your appointment for for paperwork and check in process Contact information: [address] [place] [address] [phone]                 Signed: Barnetta Chapel, [facility] Surgery 07/15/2020, 10:39 AM Please see Amion for pager number during day hours 7:00am-4:30pm, 7-11:30am on Weekends

## 2020-07-15 NOTE — Progress Notes (Signed)
Pt declined meds.  Will take at home since she is about to discharge.

## 2020-07-18 LAB — SURGICAL PATHOLOGY

## 2020-11-22 ENCOUNTER — Ambulatory Visit: Payer: Medicare Other

## 2020-11-29 ENCOUNTER — Other Ambulatory Visit: Payer: Self-pay | Admitting: Family Medicine

## 2020-11-29 DIAGNOSIS — Z1231 Encounter for screening mammogram for malignant neoplasm of breast: Secondary | ICD-10-CM

## 2021-01-03 ENCOUNTER — Ambulatory Visit
Admission: RE | Admit: 2021-01-03 | Discharge: 2021-01-03 | Disposition: A | Discharge status: Home / Self Care | Payer: Medicare Other | Source: Ambulatory Visit | Attending: Family Medicine | Admitting: Family Medicine

## 2021-01-03 ENCOUNTER — Other Ambulatory Visit: Payer: Self-pay

## 2021-01-03 DIAGNOSIS — Z1231 Encounter for screening mammogram for malignant neoplasm of breast: Secondary | ICD-10-CM

## 2021-02-14 ENCOUNTER — Encounter (HOSPITAL_BASED_OUTPATIENT_CLINIC_OR_DEPARTMENT_OTHER): Payer: Self-pay | Admitting: Obstetrics and Gynecology

## 2021-02-14 ENCOUNTER — Emergency Department (HOSPITAL_BASED_OUTPATIENT_CLINIC_OR_DEPARTMENT_OTHER): Payer: HMO

## 2021-02-14 ENCOUNTER — Observation Stay (HOSPITAL_BASED_OUTPATIENT_CLINIC_OR_DEPARTMENT_OTHER)
Admission: EM | Admit: 2021-02-14 | Discharge: 2021-02-15 | Disposition: A | Discharge status: Home / Self Care | Payer: HMO | Attending: Internal Medicine | Admitting: Internal Medicine

## 2021-02-14 ENCOUNTER — Other Ambulatory Visit: Payer: Self-pay

## 2021-02-14 ENCOUNTER — Observation Stay (HOSPITAL_COMMUNITY): Payer: HMO

## 2021-02-14 DIAGNOSIS — Z794 Long term (current) use of insulin: Secondary | ICD-10-CM | POA: Diagnosis not present

## 2021-02-14 DIAGNOSIS — I6782 Cerebral ischemia: Secondary | ICD-10-CM | POA: Insufficient documentation

## 2021-02-14 DIAGNOSIS — Z124 Encounter for screening for malignant neoplasm of cervix: Secondary | ICD-10-CM | POA: Diagnosis not present

## 2021-02-14 DIAGNOSIS — I10 Essential (primary) hypertension: Secondary | ICD-10-CM | POA: Insufficient documentation

## 2021-02-14 DIAGNOSIS — Z91128 Patient's intentional underdosing of medication regimen for other reason: Secondary | ICD-10-CM | POA: Insufficient documentation

## 2021-02-14 DIAGNOSIS — Z79899 Other long term (current) drug therapy: Secondary | ICD-10-CM | POA: Insufficient documentation

## 2021-02-14 DIAGNOSIS — T45526A Underdosing of antithrombotic drugs, initial encounter: Secondary | ICD-10-CM | POA: Insufficient documentation

## 2021-02-14 DIAGNOSIS — R930 Abnormal findings on diagnostic imaging of skull and head, not elsewhere classified: Secondary | ICD-10-CM | POA: Diagnosis present

## 2021-02-14 DIAGNOSIS — Z8673 Personal history of transient ischemic attack (TIA), and cerebral infarction without residual deficits: Secondary | ICD-10-CM | POA: Insufficient documentation

## 2021-02-14 DIAGNOSIS — Z20822 Contact with and (suspected) exposure to covid-19: Secondary | ICD-10-CM | POA: Diagnosis not present

## 2021-02-14 DIAGNOSIS — G459 Transient cerebral ischemic attack, unspecified: Secondary | ICD-10-CM | POA: Diagnosis not present

## 2021-02-14 DIAGNOSIS — E119 Type 2 diabetes mellitus without complications: Secondary | ICD-10-CM | POA: Diagnosis not present

## 2021-02-14 DIAGNOSIS — Z7984 Long term (current) use of oral hypoglycemic drugs: Secondary | ICD-10-CM | POA: Diagnosis not present

## 2021-02-14 DIAGNOSIS — R4701 Aphasia: Secondary | ICD-10-CM | POA: Diagnosis present

## 2021-02-14 DIAGNOSIS — E785 Hyperlipidemia, unspecified: Secondary | ICD-10-CM

## 2021-02-14 DIAGNOSIS — R519 Headache, unspecified: Secondary | ICD-10-CM | POA: Diagnosis present

## 2021-02-14 DIAGNOSIS — R29818 Other symptoms and signs involving the nervous system: Secondary | ICD-10-CM | POA: Diagnosis not present

## 2021-02-14 LAB — URINALYSIS, ROUTINE W REFLEX MICROSCOPIC
Bilirubin Urine: NEGATIVE
Glucose, UA: NEGATIVE mg/dL
Hgb urine dipstick: NEGATIVE
Ketones, ur: NEGATIVE mg/dL
Leukocytes,Ua: NEGATIVE
Nitrite: NEGATIVE
Protein, ur: NEGATIVE mg/dL
Specific Gravity, Urine: 1.005 (ref 1.005–1.030)
pH: 6.5 (ref 5.0–8.0)

## 2021-02-14 LAB — APTT: aPTT: 28 seconds (ref 24–36)

## 2021-02-14 LAB — GLUCOSE, CAPILLARY: Glucose-Capillary: 115 mg/dL — ABNORMAL HIGH (ref 70–99)

## 2021-02-14 LAB — COMPREHENSIVE METABOLIC PANEL
ALT: 26 U/L (ref 0–44)
AST: 17 U/L (ref 15–41)
Albumin: 4.3 g/dL (ref 3.5–5.0)
Alkaline Phosphatase: 66 U/L (ref 38–126)
Anion gap: 8 (ref 5–15)
BUN: 16 mg/dL (ref 8–23)
CO2: 29 mmol/L (ref 22–32)
Calcium: 10 mg/dL (ref 8.9–10.3)
Chloride: 103 mmol/L (ref 98–111)
Creatinine, Ser: 0.7 mg/dL (ref 0.44–1.00)
GFR, Estimated: >60 mL/min (ref >60)
Glucose, Bld: 124 mg/dL — ABNORMAL HIGH (ref 70–99)
Potassium: 4 mmol/L (ref 3.5–5.1)
Sodium: 140 mmol/L (ref 135–145)
Total Bilirubin: 0.5 mg/dL (ref 0.3–1.2)
Total Protein: 7.2 g/dL (ref 6.5–8.1)

## 2021-02-14 LAB — RESP PANEL BY RT-PCR (FLU A&B, COVID) ARPGX2
Influenza A by PCR: NEGATIVE
Influenza B by PCR: NEGATIVE
SARS Coronavirus 2 by RT PCR: NEGATIVE

## 2021-02-14 LAB — DIFFERENTIAL
Abs Immature Granulocytes: 0.02 10*3/uL (ref 0.00–0.07)
Basophils Absolute: 0 10*3/uL (ref 0.0–0.1)
Basophils Relative: 1 %
Eosinophils Absolute: 0.2 10*3/uL (ref 0.0–0.5)
Eosinophils Relative: 3 %
Immature Granulocytes: 0 %
Lymphocytes Relative: 22 %
Lymphs Abs: 1.6 10*3/uL (ref 0.7–4.0)
Monocytes Absolute: 0.5 10*3/uL (ref 0.1–1.0)
Monocytes Relative: 7 %
Neutro Abs: 5 10*3/uL (ref 1.7–7.7)
Neutrophils Relative %: 67 %

## 2021-02-14 LAB — CBC
HCT: 45.6 % (ref 36.0–46.0)
Hemoglobin: 14.6 g/dL (ref 12.0–15.0)
MCH: 29 pg (ref 26.0–34.0)
MCHC: 32 g/dL (ref 30.0–36.0)
MCV: 90.7 fL (ref 80.0–100.0)
Platelets: 199 10*3/uL (ref 150–400)
RBC: 5.03 MIL/uL (ref 3.87–5.11)
RDW: 13.2 % (ref 11.5–15.5)
WBC: 7.3 10*3/uL (ref 4.0–10.5)
nRBC: 0 % (ref 0.0–0.2)

## 2021-02-14 LAB — PROTIME-INR
INR: 0.9 (ref 0.8–1.2)
Prothrombin Time: 12.1 seconds (ref 11.4–15.2)

## 2021-02-14 LAB — RAPID URINE DRUG SCREEN, HOSP PERFORMED
Amphetamines: NOT DETECTED
Barbiturates: NOT DETECTED
Benzodiazepines: NOT DETECTED
Cocaine: NOT DETECTED
Opiates: NOT DETECTED
Tetrahydrocannabinol: NOT DETECTED

## 2021-02-14 LAB — ETHANOL: Alcohol, Ethyl (B): 11 mg/dL — ABNORMAL HIGH (ref <10)

## 2021-02-14 MED ORDER — SODIUM CHLORIDE 0.9 % IV BOLUS
500.0000 mL | Freq: Once | INTRAVENOUS | Status: AC
  Administered 2021-02-14: 500 mL via INTRAVENOUS

## 2021-02-14 MED ORDER — DIPHENHYDRAMINE HCL 50 MG/ML IJ SOLN
25.0000 mg | Freq: Once | INTRAMUSCULAR | Status: AC
  Administered 2021-02-14: 25 mg via INTRAVENOUS
  Filled 2021-02-14: qty 1

## 2021-02-14 MED ORDER — PROCHLORPERAZINE EDISYLATE 10 MG/2ML IJ SOLN
10.0000 mg | Freq: Once | INTRAMUSCULAR | Status: AC
  Administered 2021-02-14: 10 mg via INTRAVENOUS
  Filled 2021-02-14: qty 2

## 2021-02-14 MED ORDER — ZOLPIDEM TARTRATE 5 MG PO TABS
5.0000 mg | ORAL_TABLET | Freq: Every day | ORAL | Status: DC
  Administered 2021-02-15: 5 mg via ORAL
  Filled 2021-02-14: qty 1

## 2021-02-14 MED ORDER — SODIUM CHLORIDE 0.9% FLUSH
3.0000 mL | Freq: Once | INTRAVENOUS | Status: AC
  Administered 2021-02-14: 3 mL via INTRAVENOUS
  Filled 2021-02-14: qty 3

## 2021-02-14 MED ORDER — LORAZEPAM 2 MG/ML IJ SOLN
1.0000 mg | Freq: Once | INTRAMUSCULAR | Status: AC | PRN
  Administered 2021-02-14: 1 mg via INTRAVENOUS
  Filled 2021-02-14: qty 1

## 2021-02-14 MED ORDER — SODIUM CHLORIDE 0.9 % IV SOLN
100.0000 mL/h | INTRAVENOUS | Status: DC
  Administered 2021-02-14 (×2): 100 mL/h via INTRAVENOUS

## 2021-02-14 NOTE — ED Provider Notes (Signed)
[facility] EMERGENCY DEPT Provider Note   CSN: [id number] Arrival date & time: 02/14/21  1217     History  Chief Complaint  Patient presents with   Headache    Christina Wong is a 71 y.o. female.   Headache  Patient has a history of aphasia, TIA, hypertension, hypercholesterolemia and diabetes who presents with difficulty with her speech.  Patient states last time she was known normal was last night before she went to bed.  Patient states when she woke up this morning around 9 she felt a little bit off but did not speak.  She went back to bed and then when she woke up again an hour or 2 later she spoke to her husband and she was having difficulty finding her words.  Patient also experienced a headache.  She is not having any trouble with her vision.  No focal numbness or weakness.  Patient states she has had similar episodes in the past many years ago.  She was evaluated by Dr. Pearlean Brownie neurology.  Patient states at one point they thought she might of had a TIA but then complex migraine was also mention.  According to the medical records back in July 2015 patient did had MRIs and MRIs that did not show any signs of stenosis or acute stroke.  Home Medications Prior to Admission medications   Medication Sig Start Date End Date Taking? Authorizing Provider  acetaminophen (TYLENOL) 500 MG tablet Take 2 tablets (1,000 mg total) by mouth every 6 (six) hours as needed. 07/15/20   Barnetta Chapel, PA-C  B Complex Vitamins (VITAMIN B COMPLEX PO) Take 1 tablet by mouth daily.    [provider]  Calcium Carbonate (CALCIUM 600 PO) Take 600 mg by mouth 2 (two) times daily.    [provider]  clobetasol (TEMOVATE) 0.05 % external solution Apply 1 application topically daily as needed. 03/17/18   [provider]  Dulaglutide 1.5 MG/0.5ML SOPN Inject 1.5 mg into the skin once a week.    [provider]  losartan (COZAAR) 50 MG tablet Take 50 mg by mouth  daily.    [provider]  metFORMIN (GLUCOPHAGE) 500 MG tablet Take 500 mg by mouth 2 (two) times daily with a meal.    [provider]  methocarbamol (ROBAXIN) 500 MG tablet Take 1 tablet (500 mg total) by mouth every 8 (eight) hours as needed for muscle spasms. 07/15/20   Barnetta Chapel, PA-C  Multiple Vitamins-Minerals (HAIR SKIN AND NAILS FORMULA PO) Take 1 tablet by mouth daily.    [provider]  oxyCODONE (OXY IR/ROXICODONE) 5 MG immediate release tablet Take 1 tablet (5 mg total) by mouth every 4 (four) hours as needed for moderate pain. 07/15/20   Barnetta Chapel, PA-C  potassium chloride SA (K-DUR,KLOR-CON) 20 MEQ tablet Take 20 mEq by mouth 2 (two) times daily.    [provider]  rosuvastatin (CRESTOR) 5 MG tablet Take 5 mg by mouth every morning.    [provider]  Triamcinolone Acetonide (NASACORT AQ NA) Place 2 sprays into the nose daily.    [provider]  VITAMIN D PO Take 1,000 Units by mouth daily.    [provider]  zolpidem (AMBIEN) 10 MG tablet Take 5 mg by mouth at bedtime.    [provider]  Saxagliptin HCl (ONGLYZA PO) Take 1,000 mg by mouth.    04/13/11  [provider]      Allergies    Lipitor [  atorvastatin], Onion, Bactrim [sulfamethoxazole-trimethoprim], and Sulfa antibiotics    Review of Systems   Review of Systems  Neurological:  Positive for headaches.  All other systems reviewed and are negative.  Physical Exam Updated Vital Signs BP (!) 177/96    Pulse 79    Temp 98.4 F (36.9 C) (Oral)    Resp 15    Ht 1.575 m (5\' 2" )    Wt 90.7 kg    LMP 02/05/2006    SpO2 99%    BMI 36.58 kg/m  Physical Exam Vitals and nursing note reviewed.  Constitutional:      General: She is not in acute distress.    Appearance: She is well-developed.  HENT:     Head: Normocephalic and atraumatic.     Right Ear: External ear normal.     Left Ear: External ear normal.  Eyes:     General: No  scleral icterus.       Right eye: No discharge.        Left eye: No discharge.     Conjunctiva/sclera: Conjunctivae normal.  Neck:     Trachea: No tracheal deviation.  Cardiovascular:     Rate and Rhythm: Normal rate and regular rhythm.  Pulmonary:     Effort: Pulmonary effort is normal. No respiratory distress.     Breath sounds: Normal breath sounds. No stridor. No wheezing or rales.  Abdominal:     General: Bowel sounds are normal. There is no distension.     Palpations: Abdomen is soft.     Tenderness: There is no abdominal tenderness. There is no guarding or rebound.  Musculoskeletal:        General: No tenderness.     Cervical back: Neck supple.  Skin:    General: Skin is warm and dry.     Findings: No rash.  Neurological:     Mental Status: She is alert and oriented to person, place, and time.     Cranial Nerves: No cranial nerve deficit.     Sensory: No sensory deficit.     Motor: No abnormal muscle tone or seizure activity.     Coordination: Coordination normal.     Comments: No pronator drift bilateral upper extrem, able to hold both legs off bed for 5 seconds, sensation intact in all extremities, no visual field cuts, no left or right sided neglect, normal finger-nose exam bilaterally, no nystagmus noted  No facial droop, extraocular movements intact, tongue midline, some word finding difficulty, aphasia noted    ED Results / Procedures / Treatments   Labs (all labs ordered are listed, but only abnormal results are displayed) Labs Reviewed  RESP PANEL BY RT-PCR (FLU A&B, COVID) ARPGX2  CBC  DIFFERENTIAL  PROTIME-INR  APTT  COMPREHENSIVE METABOLIC PANEL  ETHANOL  RAPID URINE DRUG SCREEN, HOSP PERFORMED  URINALYSIS, ROUTINE W REFLEX MICROSCOPIC  CBG MONITORING, ED    EKG EKG Interpretation  Date/Time:  Tuesday February 14 2021 12:35:37 EST Ventricular Rate:  72 PR Interval:  139 QRS Duration: 91 QT Interval:  389 QTC Calculation: 426 R Axis:   22 Text  Interpretation: Sinus arrhythmia Abnormal R-wave progression, early transition No significant change since last tracing Confirmed by [id number] [phone]) on 02/14/2021 12:42:18 PM  Radiology No results found.  Procedures Procedures    Medications Ordered in ED Medications  sodium chloride flush (NS) 0.9 % injection 3 mL (has no administration in time range)  sodium chloride 0.9 % bolus 500 mL (has no administration  in time range)    Followed by  0.9 %  sodium chloride infusion (has no administration in time range)  prochlorperazine (COMPAZINE) injection 10 mg (has no administration in time range)  diphenhydrAMINE (BENADRYL) injection 25 mg (has no administration in time range)    ED Course/ Medical Decision Making/ A&P Clinical Course as of 02/15/21 0715  Tue Feb 14, 2021  1345 Reviewed case with Dr. Thomasena Edisollins neurology.  Agrees with transfer and admission for further work-up.  Neurology will need to be consulted when the patient arrives [JK]  1345 CBC and metabolic panel unremarkable. [JK]  1346 Head CT findings discussed with radiology, new area of possible hemorrhage.  Patient will need MRI for further evaluation [JK]  1359 Discussed with Dr. Sheppard PentonWolf, hospitalist.  We will plan on admission [JK]    Clinical Course User Index [JK] Linwood DibblesKnapp, Wylie Russon, MD                           Patient presented to the ED for evaluation of speech disturbance.  Symptoms concerning for the possibility of TIA stroke, complex migraine.  CT scan shows an abnormality concerning for the possibility of new hemorrhage.  Patient was initially outside tPA window and certainly with the possibility of hemorrhage not a candidate for any anticoagulation.  Patient will require transfer to the hospital for further evaluation and treatment, MRI.  Care was discussed with neuro hospitalist and hospitalist service.         Final Clinical Impression(s) / ED Diagnoses Final diagnoses:  Aphasia      Linwood DibblesKnapp, Mily Malecki, MD 02/15/21  [phone]

## 2021-02-14 NOTE — ED Notes (Signed)
[facility] arrived to transport pt. Pt stable at time of departure ?

## 2021-02-14 NOTE — ED Triage Notes (Signed)
Patient reports this morning when she woke up she had trouble finding her words and speaking. Patient reports this occurred at 0930 this morning. Patient reports this happened about 5-6 years ago as well. Patient reports she is "better now" but still seems slow to respond and having trouble responding to questions. Patient last known normal was at 2am this morning when she went to sleep.

## 2021-02-14 NOTE — ED Notes (Signed)
Pt's speech pattern I normal and Pt is quicker to answer questions. Pt states that her headache has almost subsided

## 2021-02-14 NOTE — Progress Notes (Signed)
Discussed with neurology and we will be doing ER to ER transfer. Called patient placement to let them know we will not be doing an admit and discharged admit order. They will page back out for admit after cleared by ED at [facility] if needed.   Dr. Artis Flock [facility]

## 2021-02-14 NOTE — ED Notes (Signed)
Patient reports being unable to tolerate sitting in the stretcher. RN assisted patient to the chair and gave a fresh warm blanket.

## 2021-02-14 NOTE — Progress Notes (Signed)
Received a phone call from Facility: [facility]  Requesting MD: Dr. Lynelle Doctor Patient with h/o T2DM, HLD, HTN, TIA vs. Complex migraine presenting with aphasia/dysarthria complaints that started this morning when she woke up around 930. LKW at 2am. Exam wnl except for word finding difficulty per EDP.   Vitals: stable, elevated BP and labs wnl  CTH with There is new subtle linear focus of increased density measuring 8 x 3 mm in size in the medial left temporal lobe. This may suggest petechial hemorrhage in the medial temporal lobe or small focus of subarachnoid hemorrhage or calcification.   Neurology was called by edp, but he has not spoken to Dr. Thomasena Edis as of yet.   Plan of care: admit to [facility] for stroke w/u. Needs brain MRI.   The patient will be accepted for admission to telemetry at [facility] when bed is available.   Nursing staff, Please call the [facility] Admits & Consults System-Wide number at the top of Amion at the time of the patient's arrival so that the patient can be paged to the admitting physician.   Lanney Gins, M.D. [facility]

## 2021-02-15 ENCOUNTER — Encounter (HOSPITAL_BASED_OUTPATIENT_CLINIC_OR_DEPARTMENT_OTHER): Payer: Self-pay | Admitting: Obstetrics & Gynecology

## 2021-02-15 ENCOUNTER — Ambulatory Visit (INDEPENDENT_AMBULATORY_CARE_PROVIDER_SITE_OTHER): Payer: HMO | Admitting: Obstetrics & Gynecology

## 2021-02-15 ENCOUNTER — Other Ambulatory Visit (HOSPITAL_COMMUNITY)
Admission: RE | Admit: 2021-02-15 | Discharge: 2021-02-15 | Disposition: A | Discharge status: Home / Self Care | Payer: HMO | Source: Ambulatory Visit | Attending: Obstetrics & Gynecology | Admitting: Obstetrics & Gynecology

## 2021-02-15 VITALS — BP 141/92 | HR 96 | Ht 62.25 in | Wt 212.0 lb

## 2021-02-15 DIAGNOSIS — Z01419 Encounter for gynecological examination (general) (routine) without abnormal findings: Secondary | ICD-10-CM

## 2021-02-15 DIAGNOSIS — I629 Nontraumatic intracranial hemorrhage, unspecified: Secondary | ICD-10-CM | POA: Diagnosis not present

## 2021-02-15 DIAGNOSIS — Z860100 Personal history of colon polyps, unspecified: Secondary | ICD-10-CM | POA: Insufficient documentation

## 2021-02-15 DIAGNOSIS — Z124 Encounter for screening for malignant neoplasm of cervix: Secondary | ICD-10-CM

## 2021-02-15 DIAGNOSIS — Q283 Other malformations of cerebral vessels: Secondary | ICD-10-CM | POA: Diagnosis not present

## 2021-02-15 DIAGNOSIS — Z79899 Other long term (current) drug therapy: Secondary | ICD-10-CM | POA: Diagnosis not present

## 2021-02-15 DIAGNOSIS — E2839 Other primary ovarian failure: Secondary | ICD-10-CM

## 2021-02-15 DIAGNOSIS — Z780 Asymptomatic menopausal state: Secondary | ICD-10-CM

## 2021-02-15 DIAGNOSIS — R29818 Other symptoms and signs involving the nervous system: Secondary | ICD-10-CM | POA: Diagnosis not present

## 2021-02-15 DIAGNOSIS — Z794 Long term (current) use of insulin: Secondary | ICD-10-CM | POA: Diagnosis not present

## 2021-02-15 DIAGNOSIS — R4701 Aphasia: Secondary | ICD-10-CM | POA: Diagnosis not present

## 2021-02-15 DIAGNOSIS — H401131 Primary open-angle glaucoma, bilateral, mild stage: Secondary | ICD-10-CM | POA: Diagnosis not present

## 2021-02-15 DIAGNOSIS — Z8673 Personal history of transient ischemic attack (TIA), and cerebral infarction without residual deficits: Secondary | ICD-10-CM | POA: Diagnosis not present

## 2021-02-15 DIAGNOSIS — N390 Urinary tract infection, site not specified: Secondary | ICD-10-CM

## 2021-02-15 DIAGNOSIS — I10 Essential (primary) hypertension: Secondary | ICD-10-CM | POA: Diagnosis not present

## 2021-02-15 DIAGNOSIS — R519 Headache, unspecified: Secondary | ICD-10-CM | POA: Diagnosis present

## 2021-02-15 DIAGNOSIS — M545 Low back pain, unspecified: Secondary | ICD-10-CM | POA: Diagnosis not present

## 2021-02-15 DIAGNOSIS — G8929 Other chronic pain: Secondary | ICD-10-CM

## 2021-02-15 DIAGNOSIS — E119 Type 2 diabetes mellitus without complications: Secondary | ICD-10-CM | POA: Diagnosis not present

## 2021-02-15 DIAGNOSIS — Z7984 Long term (current) use of oral hypoglycemic drugs: Secondary | ICD-10-CM | POA: Diagnosis not present

## 2021-02-15 DIAGNOSIS — R930 Abnormal findings on diagnostic imaging of skull and head, not elsewhere classified: Secondary | ICD-10-CM | POA: Diagnosis present

## 2021-02-15 DIAGNOSIS — I6782 Cerebral ischemia: Secondary | ICD-10-CM | POA: Diagnosis not present

## 2021-02-15 DIAGNOSIS — Z20822 Contact with and (suspected) exposure to covid-19: Secondary | ICD-10-CM | POA: Diagnosis not present

## 2021-02-15 LAB — GLUCOSE, CAPILLARY: Glucose-Capillary: 98 mg/dL (ref 70–99)

## 2021-02-15 MED ORDER — POTASSIUM CHLORIDE CRYS ER 20 MEQ PO TBCR
20.0000 meq | EXTENDED_RELEASE_TABLET | Freq: Two times a day (BID) | ORAL | Status: DC
  Administered 2021-02-15 (×2): 20 meq via ORAL
  Filled 2021-02-15 (×2): qty 1

## 2021-02-15 MED ORDER — DICLOFENAC SODIUM 75 MG PO TBEC: 75.0000 mg | DELAYED_RELEASE_TABLET | Freq: Two times a day (BID) | ORAL | 0 refills | Status: DC

## 2021-02-15 MED ORDER — ONDANSETRON HCL 4 MG PO TABS: 4.0000 mg | ORAL_TABLET | Freq: Four times a day (QID) | ORAL | Status: DC | PRN

## 2021-02-15 MED ORDER — ONDANSETRON HCL 4 MG/2ML IJ SOLN: 4.0000 mg | Freq: Four times a day (QID) | INTRAMUSCULAR | Status: DC | PRN

## 2021-02-15 MED ORDER — ROSUVASTATIN CALCIUM 5 MG PO TABS
5.0000 mg | ORAL_TABLET | Freq: Every morning | ORAL | Status: DC
  Administered 2021-02-15: 5 mg via ORAL
  Filled 2021-02-15: qty 1

## 2021-02-15 MED ORDER — ACETAMINOPHEN 325 MG PO TABS: 650.0000 mg | ORAL_TABLET | Freq: Four times a day (QID) | ORAL | Status: DC | PRN

## 2021-02-15 MED ORDER — METFORMIN HCL 500 MG PO TABS
500.0000 mg | ORAL_TABLET | Freq: Two times a day (BID) | ORAL | Status: DC
  Administered 2021-02-15: 500 mg via ORAL
  Filled 2021-02-15: qty 1

## 2021-02-15 MED ORDER — ACETAMINOPHEN 650 MG RE SUPP: 650.0000 mg | Freq: Four times a day (QID) | RECTAL | Status: DC | PRN

## 2021-02-15 MED ORDER — NITROFURANTOIN MONOHYD MACRO 100 MG PO CAPS: 100.0000 mg | ORAL_CAPSULE | Freq: Two times a day (BID) | ORAL | 1 refills | Status: DC

## 2021-02-15 MED ORDER — CLOPIDOGREL BISULFATE 75 MG PO TABS: 75.0000 mg | ORAL_TABLET | Freq: Every day | ORAL | 0 refills | Status: DC

## 2021-02-15 MED ORDER — LOSARTAN POTASSIUM 50 MG PO TABS
50.0000 mg | ORAL_TABLET | Freq: Every day | ORAL | Status: DC
  Administered 2021-02-15: 50 mg via ORAL
  Filled 2021-02-15: qty 1

## 2021-02-15 NOTE — Plan of Care (Signed)

## 2021-02-15 NOTE — H&P (Signed)
History and Physical    Christina Wong EUM:[id number] DOB: 1951-01-09 DOA: 02/14/2021  PCP: [name], MD  Patient coming from: Home  I have personally briefly reviewed patient's old medical records in [facility]  Chief Complaint: aphasia  HPI: Christina Wong is a 71 y.o. female with medical history significant of DM2, HTN, HLD, TIA vs complex migraine.  Pt presented to ED with c/o aphasia / dysarthria.  Onset on waking this AM around 930.  LKW 2am prior to that.  Symptoms have slowly resolved throughout the day.  Doesn't seem to still have word finding difficulties at this time.   ED Course: CT head with 3cm linear focus of increased density.  Could be SAH but more likely just artifact per neurologists.  Plan was to transfer ED to ED, obtain MRI, and if neg pt could be discharged per neurology.  Unfortunately, pt got sent ED to [facility] floor instead.   Past Medical History:  Diagnosis Date   Aphasia 07/11/2013   x 10-15 min   Elevated liver function tests    One of her studies are slightly elevated which may represent fatty liver from her Diabetes and her obesity or may represent an adverse effect from the Crestor.   Exogenous obesity    She has had a problem with   Headache(784.0)    "daily to weekly lately" (09/02/2013)   Hypercholesterolemia    Hypertension    hx of essential- on meds   Infertility    Leg cramps    in last 6 months   Neck stiffness 07/13/2014   and neck pain   PONV (postoperative nausea and vomiting)    TIA (transient ischemic attack) 09/01/2013   "that's what they think"   Type II diabetes mellitus (HCC)    mild   Walking pneumonia ~ 1972    Past Surgical History:  Procedure Laterality Date   ANKLE FRACTURE SURGERY Left ~ 2007   ANTERIOR CERVICAL DECOMP/DISCECTOMY FUSION  2001   BREAST BIOPSY     CARDIOVASCULAR STRESS TEST  06-29-2002   EF is normal at 55%   CHOLECYSTECTOMY N/A 07/14/2020   Procedure: LAPAROSCOPIC CHOLECYSTECTOMY;   Surgeon: Berna Bue, MD;  Location: [facility] ORS;  Service: General;  Laterality: N/A;   DILATION AND CURETTAGE OF UTERUS     FOOT SURGERY Left 02/2014   KNEE ARTHROSCOPY Left 1980's   KNEE ARTHROSCOPY WITH MENISCAL REPAIR Right 2/14   LAPAROSCOPY     multiple s/p infertility   PILONIDAL CYST EXCISION     TONSILLECTOMY  1960's     reports that she has never smoked. She has never been exposed to tobacco smoke. She has never used smokeless tobacco. She reports current alcohol use of about 7.0 standard drinks per week. She reports that she does not use drugs.  Allergies  Allergen Reactions   Lipitor [Atorvastatin] Other (See Comments)    Leg cramps   Onion Nausea And Vomiting   Bactrim [Sulfamethoxazole-Trimethoprim] Other (See Comments)    Muscle issues   Sulfa Antibiotics Other (See Comments)    unknown    Family History  Problem Relation Age of Onset   Heart Problems Mother        aortic valve replacement   Diabetes Father    Hypertension Father    CVA Father    Hypertension Paternal Grandmother    CVA Paternal Grandmother    Breast cancer Maternal Aunt     Prior to Admission medications  Medication Sig Start Date End Date Taking? Authorizing Provider  B Complex Vitamins (VITAMIN B COMPLEX PO) Take 1 tablet by mouth daily.   Yes [provider]  Calcium Carbonate (CALCIUM 600 PO) Take 600 mg by mouth 2 (two) times daily.   Yes [provider]  diclofenac (VOLTAREN) 75 MG EC tablet Take 75 mg by mouth 2 (two) times daily. 02/13/21  Yes [provider]  Dulaglutide 1.5 MG/0.5ML SOPN Inject 1.5 mg into the skin once a week.   Yes [provider]  losartan (COZAAR) 50 MG tablet Take 50 mg by mouth daily.   Yes [provider]  metFORMIN (GLUCOPHAGE) 500 MG tablet Take 500 mg by mouth 2 (two) times daily with a meal.   Yes [provider]  Multiple Vitamins-Minerals (HAIR SKIN AND NAILS FORMULA PO) Take 1 tablet by mouth  daily.   Yes [provider]  potassium chloride SA (K-DUR,KLOR-CON) 20 MEQ tablet Take 20 mEq by mouth 2 (two) times daily.   Yes [provider]  rosuvastatin (CRESTOR) 5 MG tablet Take 5 mg by mouth every morning.   Yes [provider]  VITAMIN D PO Take 1,000 Units by mouth daily.   Yes [provider]  zolpidem (AMBIEN) 10 MG tablet Take 5 mg by mouth at bedtime.   Yes [provider]  Saxagliptin HCl (ONGLYZA PO) Take 1,000 mg by mouth.    04/13/11  [provider]    Physical Exam: Vitals:   02/14/21 2032 02/14/21 2133 02/14/21 2328 02/15/21 0145  BP: (!) 159/88 (!) 161/101 (!) 164/92 138/86  Pulse: 96 83 80 86  Resp: 14 14 18 18   Temp: 98.6 F (37 C) 98.3 F (36.8 C) 97.8 F (36.6 C) 97.9 F (36.6 C)  TempSrc: Oral Oral Oral Oral  SpO2: 95% 97% 96% 97%  Weight:      Height:        Constitutional: NAD, calm, comfortable Eyes: PERRL, lids and conjunctivae normal Respiratory: clear to auscultation bilaterally, no wheezing, no crackles. Normal respiratory effort. No accessory muscle use.  Cardiovascular: Regular rate and rhythm, no murmurs / rubs / gallops. No extremity edema. 2+ pedal pulses. No carotid bruits.  Abdomen: no tenderness, no masses palpated. No hepatosplenomegaly. Bowel sounds positive.  Neurologic: CN 2-12 grossly intact. Sensation intact, DTR normal. Strength 5/5 in all 4.  Psychiatric: Normal judgment and insight. Alert and oriented x 3. Normal mood.     Labs on Admission: I have personally reviewed following labs and imaging studies  CBC: Recent Labs  Lab 02/14/21 1231  WBC 7.3  NEUTROABS 5.0  HGB 14.6  HCT 45.6  MCV 90.7  PLT 199   Basic Metabolic Panel: Recent Labs  Lab 02/14/21 1231  NA 140  K 4.0  CL 103  CO2 29  GLUCOSE 124*  BUN 16  CREATININE 0.70  CALCIUM 10.0   GFR: Estimated Creatinine Clearance: 67.5 mL/min (by C-G formula based on SCr of 0.7 mg/dL). Liver Function  Tests: Recent Labs  Lab 02/14/21 1231  AST 17  ALT 26  ALKPHOS 66  BILITOT 0.5  PROT 7.2  ALBUMIN 4.3   No results for input(s): LIPASE, AMYLASE in the last 168 hours. No results for input(s): AMMONIA in the last 168 hours. Coagulation Profile: Recent Labs  Lab 02/14/21 1231  INR 0.9   Cardiac Enzymes: No results for input(s): CKTOTAL, CKMB, CKMBINDEX, TROPONINI in the last 168 hours. BNP (last 3 results) No results for  input(s): PROBNP in the last 8760 hours. HbA1C: No results for input(s): HGBA1C in the last 72 hours. CBG: Recent Labs  Lab 02/14/21 2239  GLUCAP 115*   Lipid Profile: No results for input(s): CHOL, HDL, LDLCALC, TRIG, CHOLHDL, LDLDIRECT in the last 72 hours. Thyroid Function Tests: No results for input(s): TSH, T4TOTAL, FREET4, T3FREE, THYROIDAB in the last 72 hours. Anemia Panel: No results for input(s): VITAMINB12, FOLATE, FERRITIN, TIBC, IRON, RETICCTPCT in the last 72 hours. Urine analysis:    Component Value Date/Time   COLORURINE COLORLESS (A) 02/14/2021 1345   APPEARANCEUR CLEAR 02/14/2021 1345   LABSPEC 1.005 02/14/2021 1345   PHURINE 6.5 02/14/2021 1345   GLUCOSEU NEGATIVE 02/14/2021 1345   HGBUR NEGATIVE 02/14/2021 1345   BILIRUBINUR NEGATIVE 02/14/2021 1345   BILIRUBINUR n 10/16/2012 1113   KETONESUR NEGATIVE 02/14/2021 1345   PROTEINUR NEGATIVE 02/14/2021 1345   UROBILINOGEN 0.2 09/01/2013 2028   NITRITE NEGATIVE 02/14/2021 1345   LEUKOCYTESUR NEGATIVE 02/14/2021 1345    Radiological Exams on Admission: CT HEAD WO CONTRAST  Result Date: 02/14/2021 CLINICAL DATA:  Neuro deficit, difficulty speaking EXAM: CT HEAD WITHOUT CONTRAST TECHNIQUE: Contiguous axial images were obtained from the base of the skull through the vertex without intravenous contrast. COMPARISON:  09/01/2013 FINDINGS: Brain: Ventricles are not dilated. There is no shift of midline structures. Cortical sulci are prominent. In image 11 of series 2, there is 8 x 3 mm  linear subtle focus of increased density in the medial left temporal lobe. This finding was not evident in the previous examination done on 09/01/2013. There is no significant adjacent edema or mass effect. Vascular: Unremarkable. Skull: Unremarkable. Sinuses/Orbits: There is mild mucosal thickening in the ethmoid sinus. Other: None IMPRESSION: There is new subtle linear focus of increased density measuring 8 x 3 mm in size in the medial left temporal lobe. This may suggest petechial hemorrhage in the medial temporal lobe or small focus of subarachnoid hemorrhage or calcification. Follow-up MRI may be considered. There are no epidural or subdural fluid collections. There is no shift of midline structures. There is no focal mass effect. Imaging findings were relayed to Dr. Lynelle Doctor by telephone call. Electronically Signed   By: Ernie Avena M.D.   On: 02/14/2021 13:23    EKG: Independently reviewed.  Assessment/Plan Principal Problem:   Aphasia Active Problems:   Abnormal CT of the head    Abnormal CT head - Getting MRI as instructed by neurology If MRI positive: Needs formal neuro consult Needs work up of positive finding If MRI is instead negative: Pt cleared to go home from neurologist standpoint Will put in for overnight obs since she is already on the floor and it is so late in the evening (almost 3am). Aphasia - Seems resolved at this time  DVT prophylaxis: Early ambulation Code Status: Full Family Communication: No family in room Disposition Plan: Home, possibly in AM if MRI is negative Consults called: Spoke with neurology Dr. Otelia Limes Admission status: Place in obs    Brydon Spahr M. DO [facility]  How to contact the [facility] Attending or Consulting provider 7A - 7P or covering provider during after hours 7P -7A, for this patient?  Check the care team in [facility] and look for a) attending/consulting [facility] provider listed and b) the [facility] team listed Log into [email or web address]   Amion Physician Scheduling and messaging for groups and whole hospitals  On call and physician scheduling software for group practices, residents, hospitalists and other medical providers for call,  clinic, rotation and shift schedules. OnCall [employer] is a hospital-wide system for scheduling doctors and paging doctors on call. EasyPlot is for scientific plotting and data analysis.  [email or web address]  and use [place]'s universal password to access. If you do not have the password, please contact the hospital operator.  Locate the [facility] provider you are looking for under [facility] and page to a number that you can be directly reached. If you still have difficulty reaching the provider, please page the [facility] (Director on Call) for the Hospitalists listed on amion for assistance.  02/15/2021, 2:33 AM

## 2021-02-15 NOTE — TOC Transition Note (Signed)
Transition of Care [facility]) - CM/SW Discharge Note   Patient Details  Name: KIARRA KIDD MRN: [id number] Date of Birth: [birth year]  [facility] CM/SW Contact:  Kermit Balo, RN Phone Number: 02/15/2021, 10:24 AM   Clinical Narrative:    Patient is discharging home with self care. No needs per TOC. Pt denies issues with home medications or transportation. No DME at home. Pt has transport home today when discharged.    Final next level of care: Home/Self Care Barriers to Discharge: No Barriers Identified   Patient Goals and CMS Choice        Discharge Placement                       Discharge Plan and Services                                     Social Determinants of Health (SDOH) Interventions     Readmission Risk Interventions No flowsheet data found.

## 2021-02-15 NOTE — Plan of Care (Signed)
Called for CT finding of possible bleed.  Recommended ER to ER transfer for MRI.  MRI completed-no acute changes. No further inpatient neurological recommendations.  -- Milon Dikes, MD Neurologist [facility] Pager: [phone]

## 2021-02-15 NOTE — Progress Notes (Signed)
71 y.o. G1P1 Married White or Caucasian female here for breast and pelvic exam.  Denies vaginal bleeding.  Was seen in the ER yesterday due to a severe headache and episode with difficulty of speaking.  Was admitted overnight.  MRI was negative.  Was seen by neurology and was restarted on Plavix.  She's going to the [place] next week and will have follow up when she gets back from the trip.  Now has four grandchildren.    Has been treated by Dr. Lucie Leather with [facility].  She is having a lot of back pain.  She's had an MRI and this did show some lumbar issues.  She is not interested in surgery.  She has been on diclofenac.  She cannot get his office to call back and give her a refill.  She is worried about going to the [place] without having this.  I did refill this for her.  She is desirous of another opinion and changing providers due to lack of response from his office.    H/o UTI one to two times a year that come on very quickly.  Needs RF for medication.  Patient's last menstrual period was 02/05/2006.          Sexually active: Yes.    H/O STD:  no  Health Maintenance: PCP:  Dr. Nicholos Johns.  Appt was scheduled for this morning but she was in the hospital so will reschedule.     Vaccines are up to date:  reviewed.  Pt is sure she's had the pneumonia vaccination. Colonoscopy:  11/18/2015.  This is scheduled at the end of January after she returns from her trip MMG:  01/03/2021 Negative BMD:  05/03/2016 Last pap smear:  03/26/2016 Negative.   H/o abnormal pap smear:  no recent abnormal   reports that she has never smoked. She has never been exposed to tobacco smoke. She has never used smokeless tobacco. She reports current alcohol use of about 7.0 standard drinks per week. She reports that she does not use drugs.  Past Medical History:  Diagnosis Date   Aphasia 07/11/2013   x 10-15 min   Elevated liver function tests    One of her studies are slightly elevated which may represent  fatty liver from her Diabetes and her obesity or may represent an adverse effect from the Crestor.   Exogenous obesity    She has had a problem with   Headache(784.0)    "daily to weekly lately" (09/02/2013)   Hypercholesterolemia    Hypertension    hx of essential- on meds   Infertility    Leg cramps    in last 6 months   Neck stiffness 07/13/2014   and neck pain   PONV (postoperative nausea and vomiting)    TIA (transient ischemic attack) 09/01/2013   "that's what they think"   Type II diabetes mellitus (HCC)    mild   Walking pneumonia ~ 1972    Past Surgical History:  Procedure Laterality Date   ANKLE FRACTURE SURGERY Left ~ 2007   ANTERIOR CERVICAL DECOMP/DISCECTOMY FUSION  2001   BREAST BIOPSY     CARDIOVASCULAR STRESS TEST  06-29-2002   EF is normal at 55%   CHOLECYSTECTOMY N/A 07/14/2020   Procedure: LAPAROSCOPIC CHOLECYSTECTOMY;  Surgeon: Berna Bue, MD;  Location: [facility];  Service: General;  Laterality: N/A;   DILATION AND CURETTAGE OF UTERUS     FOOT SURGERY Left 02/2014   KNEE ARTHROSCOPY Left 1980's   KNEE  ARTHROSCOPY WITH MENISCAL REPAIR Right 2/14   LAPAROSCOPY     multiple s/p infertility   PILONIDAL CYST EXCISION     TONSILLECTOMY  1960's    Current Outpatient Medications  Medication Sig Dispense Refill   B Complex Vitamins (VITAMIN B COMPLEX PO) Take 1 tablet by mouth daily.     Calcium Carbonate (CALCIUM 600 PO) Take 600 mg by mouth 2 (two) times daily.     clopidogrel (PLAVIX) 75 MG tablet Take 1 tablet (75 mg total) by mouth daily. 90 tablet 0   Dulaglutide 1.5 MG/0.5ML SOPN Inject 1.5 mg into the skin once a week.     losartan (COZAAR) 50 MG tablet Take 50 mg by mouth daily.     metFORMIN (GLUCOPHAGE) 500 MG tablet Take 500 mg by mouth 2 (two) times daily with a meal.     Multiple Vitamins-Minerals (HAIR SKIN AND NAILS FORMULA PO) Take 1 tablet by mouth daily.     nitrofurantoin, macrocrystal-monohydrate, (MACROBID) 100 MG capsule Take 1  capsule (100 mg total) by mouth 2 (two) times daily. 10 capsule 1   potassium chloride SA (K-DUR,KLOR-CON) 20 MEQ tablet Take 20 mEq by mouth 2 (two) times daily.     rosuvastatin (CRESTOR) 5 MG tablet Take 5 mg by mouth every morning.     VITAMIN D PO Take 1,000 Units by mouth daily.     zolpidem (AMBIEN) 10 MG tablet Take 5 mg by mouth at bedtime.     diclofenac (VOLTAREN) 75 MG EC tablet Take 1 tablet (75 mg total) by mouth 2 (two) times daily. 60 tablet 0   No current facility-administered medications for this visit.    Family History  Problem Relation Age of Onset   Heart Problems Mother        aortic valve replacement   Diabetes Father    Hypertension Father    CVA Father    Hypertension Paternal Grandmother    CVA Paternal Grandmother    Breast cancer Maternal Aunt     Review of Systems  All other systems reviewed and are negative.  Exam:   BP (!) 141/92 (BP Location: Right Arm, Patient Position: Sitting, Cuff Size: Large)    Pulse 96    Ht 5' 2.25" (1.581 m)    Wt 212 lb (96.2 kg)    LMP 02/05/2006    BMI 38.46 kg/m   Height: 5' 2.25" (158.1 cm)  General appearance: alert, cooperative and appears stated age Breasts: normal appearance, no masses or tenderness Abdomen: soft, non-tender; bowel sounds normal; no masses,  no organomegaly Lymph nodes: Cervical, supraclavicular, and axillary nodes normal.  No abnormal inguinal nodes palpated Neurologic: Grossly normal  Pelvic: External genitalia:  no lesions              Urethra:  normal appearing urethra with no masses, tenderness or lesions              Bartholins and Skenes: normal                 Vagina: normal appearing vagina with atrophic changes and no discharge, no lesions              Cervix: no lesions              Pap taken: Yes.   Bimanual Exam:  Uterus:  normal size, contour, position, consistency, mobility, non-tender              Adnexa: normal adnexa and no mass,  fullness, tenderness                Rectovaginal: Confirms               Anus:  normal sphincter tone, no lesions  Chaperone, Ina Homesonya Mason, CMA, was present for exam.  Assessment/Plan: 1. Encntr for gyn exam (general) (routine) w/o abn findings - pap smear obtained today - MMG up to date - colonoscopy 11/2015.  Follow up is already scheduled - Plan BMD this year - lab work done with Dr. Nicholos Johnseade - vaccines reviewed/updated  2. Postmenopausal - no HRT  3. Chronic midline low back pain without sciatica - as pt has upcoming trip, rx for diclofenac given.   - diclofenac (VOLTAREN) 75 MG EC tablet; Take 1 tablet (75 mg total) by mouth 2 (two) times daily.  Dispense: 60 tablet; Refill: 0 - AMB referral to sports medicine placed as pt desires to change providers  4. Hypoestrogenism - DG BONE DENSITY (DXA); Future  5. Recurrent UTI - nitrofurantoin, macrocrystal-monohydrate, (MACROBID) 100 MG capsule; Take 1 capsule (100 mg total) by mouth 2 (two) times daily.  Dispense: 10 capsule; Refill: 1  6. Cervical cancer screening - Cytology - PAP( [place]) - PR OBTAINING SCREEN PAP SMEAR

## 2021-02-15 NOTE — Discharge Summary (Signed)
Physician Discharge Summary  Christina Wong [id number] DOB: 09/19/1950 DOA: 02/14/2021  PCP: Elias Elseeade, Robert, MD  Admit date: 02/14/2021 Discharge date: 02/15/2021  Admitted From: Home Discharge disposition: Home   Code Status: Full Code   Discharge Diagnosis:   Principal Problem:   Aphasia Active Problems:   Abnormal CT of the head   Chief Complaint  Patient presents with   Headache   Brief narrative: Christina Wong is a 71 y.o. female with PMH significant for DM2, HTN, HLD, TIA vs complex migraine.   Pt presented to [facility] with c/o aphasia / dysarthria that she noticed when she woke up around 930 on 1/10.  Her symptoms gradually improved but she still seemed a little slow to her husband and hence she was taken to [facility].  CT head showed old 3cm linear focus of increased density.  Could be SAH but more likely just artifact per neurologist. Patient was intended to be transferred from ED to ED at [facility] but by mistake got admitted to hospitalist service. An MRI was obtained which did not show any acute intracranial process but showed chronic microvascular ischemic changes.  Subjective: Patient was seen and examined this morning.  Pleasant elderly Caucasian female.  Sitting up in chair.  Not in distress.  No new symptoms. Husband was available on the phone. Patient states that she had similar symptoms 5 to 6 years ago and was seen by Dr. Pearlean BrownieSethi.  She was maintained on Plavix and Crestor.  She however dropped Plavix after few years, continues to take Crestor.  Assessment/Plan: TIA -Presented with aphasia, dysarthria that gradually improved. -CT head as above, likely an artifact for neurologist. -As recommended by neurology, patient underwent an MRI brain which did not show any acute intracranial abnormality but showed chronic microvascular ischemic changes.  Before the transfer happened, the recommendation from neurologist on the phone was  to discharge her home if MRI was negative. -Patient states to me that she was supposed to be on Plavix and Crestor.  However she dropped Plavix few years ago and only takes Crestor.  She did not have any side effects with Plavix. -Because of the presence of microvascular changes, I would recommend her to start taking Plavix again and continue Crestor. -Follow-up with neurology as an outpatient.  Type 2 diabetes mellitus -A1c 6.1 from 2015. -Home meds include metformin 5 mg twice daily, dulaglutide 1.5 mg weekly -Continue the same at home.  Follow-up with PCP as an outpatient for repeat A1c. Recent Labs  Lab 02/14/21 2239 02/15/21 0636  GLUCAP 115* 98   Hypertension -Continue losartan.  Hyperlipidemia -Continue Crestor   Discharge Medications:   Allergies as of 02/15/2021       Reactions   Lipitor [atorvastatin] Other (See Comments)   Leg cramps   Onion Nausea And Vomiting   Bactrim [sulfamethoxazole-trimethoprim] Other (See Comments)   Muscle issues   Sulfa Antibiotics Other (See Comments)   unknown        Medication List     TAKE these medications    CALCIUM 600 PO Take 600 mg by mouth 2 (two) times daily.   clopidogrel 75 MG tablet Commonly known as: Plavix Take 1 tablet (75 mg total) by mouth daily.   diclofenac 75 MG EC tablet Commonly known as: VOLTAREN Take 75 mg by mouth 2 (two) times daily.   Dulaglutide 1.5 MG/0.5ML Sopn Inject 1.5 mg into the skin once a week.   HAIR  SKIN AND NAILS FORMULA PO Take 1 tablet by mouth daily.   losartan 50 MG tablet Commonly known as: COZAAR Take 50 mg by mouth daily.   metFORMIN 500 MG tablet Commonly known as: GLUCOPHAGE Take 500 mg by mouth 2 (two) times daily with a meal.   potassium chloride SA 20 MEQ tablet Commonly known as: KLOR-CON M Take 20 mEq by mouth 2 (two) times daily.   rosuvastatin 5 MG tablet Commonly known as: CRESTOR Take 5 mg by mouth every morning.   VITAMIN B COMPLEX PO Take 1  tablet by mouth daily.   VITAMIN D PO Take 1,000 Units by mouth daily.   zolpidem 10 MG tablet Commonly known as: AMBIEN Take 5 mg by mouth at bedtime.        Wound care:   Incision - 4 Ports Abdomen 1: Umbilicus 2: Upper 3: Medial;Upper 4: Lateral;Upper (Active)  Placement Date/Time: 07/14/20 1522   Location of Ports: Abdomen  Port: 1:  Location Orientation: Umbilicus  Port: 2:  Location Orientation: Upper  Port: 3:  Location Orientation: Medial;Upper  Port: 4:  Location Orientation: Lateral;Upper    Assessments 07/14/2020  4:22 PM 07/15/2020 12:29 AM  Port 1 Dressing Type Liquid skin adhesive Liquid skin adhesive  Port 1 Dressing Status -- Clean;Dry;Intact  Port 2 Dressing Type Liquid skin adhesive Liquid skin adhesive  Port 2 Dressing Status -- Clean;Dry;Intact  Port 3 Dressing Type Liquid skin adhesive Liquid skin adhesive  Port 3 Dressing Status -- Clean;Dry;Intact  Port 4 Dressing Type Liquid skin adhesive Liquid skin adhesive  Port 4 Dressing Status -- Clean;Intact;Dry     No Linked orders to display    Discharge Instructions:   Discharge Instructions     Ambulatory referral to Neurology   Complete by: As directed    TIA,  lost follow up.  She stopped taking Plavix.  I resumed it   Call MD for:  difficulty breathing, headache or visual disturbances   Complete by: As directed    Call MD for:  extreme fatigue   Complete by: As directed    Call MD for:  hives   Complete by: As directed    Call MD for:  persistant dizziness or light-headedness   Complete by: As directed    Call MD for:  persistant nausea and vomiting   Complete by: As directed    Call MD for:  severe uncontrolled pain   Complete by: As directed    Call MD for:  temperature >100.4   Complete by: As directed    Diet Carb Modified   Complete by: As directed    Diet general   Complete by: As directed    Discharge instructions   Complete by: As directed    General discharge  instructions:  Follow with Primary MD [name], MD in 7 days   Get CBC/BMP checked in next visit within 1 week by PCP or SNF MD. (We routinely change or add medications that can affect your baseline labs and fluid status, therefore we recommend that you get the mentioned basic workup next visit with your PCP, your PCP may decide not to get them or add new tests based on their clinical decision)  On your next visit with your PCP, please get your medicines reviewed and adjusted.  Please request your PCP  to go over all hospital tests, procedures, radiology results at the follow up, please get all Hospital records sent to your PCP by signing hospital release before  you go home.  Activity: As tolerated with Full fall precautions use walker/cane & assistance as needed  Avoid using any recreational substances like cigarette, tobacco, alcohol, or non-prescribed drug.  If you experience worsening of your admission symptoms, develop shortness of breath, life threatening emergency, suicidal or homicidal thoughts you must seek medical attention immediately by calling 911 or calling your MD immediately  if symptoms less severe.  You must read complete instructions/literature along with all the possible adverse reactions/side effects for all the medicines you take and that have been prescribed to you. Take any new medicine only after you have completely understood and accepted all the possible adverse reactions/side effects.   Do not drive, operate heavy machinery, perform activities at heights, swimming or participation in water activities or provide baby sitting services if your were admitted for syncope or siezures until you have seen by Primary MD or a Neurologist and advised to do so again.  Do not drive when taking Pain medications.  Do not take more than prescribed Pain, Sleep and Anxiety Medications  Wear Seat belts while driving.  Please note You were cared for by a hospitalist during your  hospital stay. If you have any questions about your discharge medications or the care you received while you were in the hospital after you are discharged, you can call the unit and asked to speak with the hospitalist on call if the hospitalist that took care of you is not available. Once you are discharged, your primary care physician will handle any further medical issues. Please note that NO REFILLS for any discharge medications will be authorized once you are discharged, as it is imperative that you return to your primary care physician (or establish a relationship with a primary care physician if you do not have one) for your aftercare needs so that they can reassess your need for medications and monitor your lab values.   Increase activity slowly   Complete by: As directed        Follow ups:    Follow-up Information     Elias Elseeade, Robert, MD Follow up.   Specialty: Family Medicine Contact information: [phone] [place] [address] [phone]                 Discharge Exam:   Vitals:   02/14/21 2328 02/15/21 0145 02/15/21 0358 02/15/21 0809  BP: (!) 164/92 138/86 (!) 174/92 (!) 156/86  Pulse: 80 86 81 86  Resp: 18 18 18 18   Temp: 97.8 F (36.6 C) 97.9 F (36.6 C) (!) 97.5 F (36.4 C) 98 F (36.7 C)  TempSrc: Oral Oral Oral   SpO2: 96% 97% 98% 93%  Weight:      Height:        Body mass index is 36.58 kg/m.  General exam: Pleasant, elderly Caucasian female.  Sitting up in chair.  Not in distress Skin: No rashes, lesions or ulcers. HEENT: Atraumatic, normocephalic, no obvious bleeding Lungs: Clear to auscultation bilaterally CVS: Regular rate and rhythm, no murmur GI/Abd soft, nontender, nondistended, bowel sound present CNS: Alert, awake abound x3 Psychiatry: Mood appropriate Extremities: No pedal edema, no calf tenderness   Time coordinating discharge: 35 minutes   The results of significant diagnostics from this hospitalization  (including imaging, microbiology, ancillary and laboratory) are listed below for reference.    Procedures and Diagnostic Studies:   CT HEAD WO CONTRAST  Result Date: 02/14/2021 CLINICAL DATA:  Neuro deficit, difficulty speaking EXAM: CT HEAD WITHOUT  CONTRAST TECHNIQUE: Contiguous axial images were obtained from the base of the skull through the vertex without intravenous contrast. COMPARISON:  09/01/2013 FINDINGS: Brain: Ventricles are not dilated. There is no shift of midline structures. Cortical sulci are prominent. In image 11 of series 2, there is 8 x 3 mm linear subtle focus of increased density in the medial left temporal lobe. This finding was not evident in the previous examination done on 09/01/2013. There is no significant adjacent edema or mass effect. Vascular: Unremarkable. Skull: Unremarkable. Sinuses/Orbits: There is mild mucosal thickening in the ethmoid sinus. Other: None IMPRESSION: There is new subtle linear focus of increased density measuring 8 x 3 mm in size in the medial left temporal lobe. This may suggest petechial hemorrhage in the medial temporal lobe or small focus of subarachnoid hemorrhage or calcification. Follow-up MRI may be considered. There are no epidural or subdural fluid collections. There is no shift of midline structures. There is no focal mass effect. Imaging findings were relayed to Dr. Lynelle Doctor by telephone call. Electronically Signed   By: Ernie Avena M.D.   On: 02/14/2021 13:23   MR BRAIN WO CONTRAST  Result Date: 02/15/2021 CLINICAL DATA:  Initial evaluation for neuro deficit, stroke suspected. EXAM: MRI HEAD WITHOUT CONTRAST TECHNIQUE: Multiplanar, multiecho pulse sequences of the brain and surrounding structures were obtained without intravenous contrast. COMPARISON:  Prior CT from 02/14/2021. FINDINGS: Brain: Cerebral volume within normal limits. Patchy T2/FLAIR hyperintensity involving the periventricular and deep white matter both cerebral hemispheres  most consistent with chronic small vessel ischemic disease, mild in nature. No abnormal foci of restricted diffusion to suggest acute or subacute ischemia. Gray-white matter differentiation maintained. No encephalomalacia to suggest chronic cortical infarction. No evidence for acute or chronic intracranial hemorrhage. No mass lesion, midline shift or mass effect. No hydrocephalus or extra-axial fluid collection. Pituitary gland suprasellar region normal. Midline structures intact. Vascular: Major intracranial vascular flow voids are maintained. Hypoplastic right vertebral artery noted. Skull and upper cervical spine: Craniocervical junction normal. Bone marrow signal intensity within normal limits. No scalp soft tissue abnormality. Sinuses/Orbits: Globes and orbital soft tissues within normal limits. Mild mucosal thickening noted within the ethmoidal air cells. Paranasal sinuses are otherwise clear. No mastoid effusion. Other: None. IMPRESSION: 1. No acute intracranial abnormality. 2. Mild chronic microvascular ischemic disease for age. Electronically Signed   By: Rise Mu M.D.   On: 02/15/2021 02:34     Labs:   Basic Metabolic Panel: Recent Labs  Lab 02/14/21 1231  NA 140  K 4.0  CL 103  CO2 29  GLUCOSE 124*  BUN 16  CREATININE 0.70  CALCIUM 10.0   GFR Estimated Creatinine Clearance: 67.5 mL/min (by C-G formula based on SCr of 0.7 mg/dL). Liver Function Tests: Recent Labs  Lab 02/14/21 1231  AST 17  ALT 26  ALKPHOS 66  BILITOT 0.5  PROT 7.2  ALBUMIN 4.3   No results for input(s): LIPASE, AMYLASE in the last 168 hours. No results for input(s): AMMONIA in the last 168 hours. Coagulation profile Recent Labs  Lab 02/14/21 1231  INR 0.9    CBC: Recent Labs  Lab 02/14/21 1231  WBC 7.3  NEUTROABS 5.0  HGB 14.6  HCT 45.6  MCV 90.7  PLT 199   Cardiac Enzymes: No results for input(s): CKTOTAL, CKMB, CKMBINDEX, TROPONINI in the last 168 hours. BNP: Invalid  input(s): POCBNP CBG: Recent Labs  Lab 02/14/21 2239 02/15/21 0636  GLUCAP 115* 98   D-Dimer No results for input(s):  DDIMER in the last 72 hours. Hgb A1c No results for input(s): HGBA1C in the last 72 hours. Lipid Profile No results for input(s): CHOL, HDL, LDLCALC, TRIG, CHOLHDL, LDLDIRECT in the last 72 hours. Thyroid function studies No results for input(s): TSH, T4TOTAL, T3FREE, THYROIDAB in the last 72 hours.  Invalid input(s): FREET3 Anemia work up No results for input(s): VITAMINB12, FOLATE, FERRITIN, TIBC, IRON, RETICCTPCT in the last 72 hours. Microbiology Recent Results (from the past 240 hour(s))  Resp Panel by RT-PCR (Flu A&B, Covid) Nasopharyngeal Swab     Status: None   Collection Time: 02/14/21  5:40 PM   Specimen: Nasopharyngeal Swab; Nasopharyngeal(NP) swabs in vial transport medium  Result Value Ref Range Status   SARS Coronavirus 2 by RT PCR NEGATIVE NEGATIVE Final    Comment: (NOTE) SARS-CoV-2 target nucleic acids are NOT DETECTED.  The SARS-CoV-2 RNA is generally detectable in upper respiratory specimens during the acute phase of infection. The lowest concentration of SARS-CoV-2 viral copies this assay can detect is 138 copies/mL. A negative result does not preclude SARS-Cov-2 infection and should not be used as the sole basis for treatment or other patient management decisions. A negative result may occur with  improper specimen collection/handling, submission of specimen other than nasopharyngeal swab, presence of viral mutation(s) within the areas targeted by this assay, and inadequate number of viral copies(<138 copies/mL). A negative result must be combined with clinical observations, patient history, and epidemiological information. The expected result is Negative.  Fact Sheet for Patients:  BloggerCourse.com  Fact Sheet for Healthcare Providers:  SeriousBroker.it  This test is no t yet  approved or cleared by the [place] FDA and  has been authorized for detection and/or diagnosis of SARS-CoV-2 by FDA under an Emergency Use Authorization (EUA). This EUA will remain  in effect (meaning this test can be used) for the duration of the COVID-19 declaration under Section 564(b)(1) of the Act, 21 U.S.C.section 360bbb-3(b)(1), unless the authorization is terminated  or revoked sooner.       Influenza A by PCR NEGATIVE NEGATIVE Final   Influenza B by PCR NEGATIVE NEGATIVE Final    Comment: (NOTE) The Xpert Xpress SARS-CoV-2/FLU/RSV plus assay is intended as an aid in the diagnosis of influenza from Nasopharyngeal swab specimens and should not be used as a sole basis for treatment. Nasal washings and aspirates are unacceptable for Xpert Xpress SARS-CoV-2/FLU/RSV testing.  Fact Sheet for Patients: BloggerCourse.com  Fact Sheet for Healthcare Providers: SeriousBroker.it  This test is not yet approved or cleared by the [place] FDA and has been authorized for detection and/or diagnosis of SARS-CoV-2 by FDA under an Emergency Use Authorization (EUA). This EUA will remain in effect (meaning this test can be used) for the duration of the COVID-19 declaration under Section 564(b)(1) of the Act, 21 U.S.C. section 360bbb-3(b)(1), unless the authorization is terminated or revoked.  Performed at [facility], [address], [place], [place] [address]      Signed: Lorin Glass  [facility] 02/15/2021, 10:29 AM

## 2021-02-16 LAB — CYTOLOGY - PAP: Diagnosis: NEGATIVE

## 2021-02-23 NOTE — Progress Notes (Signed)
[facility] [address] [place] [address] Phone: [phone] Subjective:   Bruce Donath, am serving as a scribe for Dr. Antoine Primas. This visit occurred during the SARS-CoV-2 public health emergency.  Safety protocols were in place, including screening questions prior to the visit, additional usage of staff PPE, and extensive cleaning of exam room while observing appropriate contact time as indicated for disinfecting solutions.  I'm seeing this patient by the request  of:  [name], MD  CC: low back pain and hip pain  [id number]  Christina Wong is a 71 y.o. female coming in with complaint of chronic LBP that radiates into lateral hips. Recent hx of TIA. Has been treated at [facility] and [facility]. Has had injection in B GT which did help. Had MRI in June 2022. Tried Meloxicam but was recently switched to diclofenac. Uses Tramadol 1/2 pill at night. Patient did try PT which was helping but Jonny Ruiz had TKR so she had to put sessions on hold.   Also having pain in back of L knee that might be from her back or from playing pickleball.   MRI of lumbar 6/22- broad based disc bulges from L3-S1 with facet arthropathy Moderate spinal stenosis L2-3 and L4-5    Past Medical History:  Diagnosis Date   Aphasia 07/11/2013   x 10-15 min   Elevated liver function tests    One of her studies are slightly elevated which may represent fatty liver from her Diabetes and her obesity or may represent an adverse effect from the Crestor.   Exogenous obesity    She has had a problem with   Headache(784.0)    "daily to weekly lately" (09/02/2013)   Hypercholesterolemia    Hypertension    hx of essential- on meds   Infertility    Leg cramps    in last 6 months   Neck stiffness 07/13/2014   and neck pain   PONV (postoperative nausea and vomiting)    TIA (transient ischemic attack) 09/01/2013   "that's what they think"   Type II diabetes mellitus (HCC)    mild    Walking pneumonia ~ 1972   Past Surgical History:  Procedure Laterality Date   ANKLE FRACTURE SURGERY Left ~ 2007   ANTERIOR CERVICAL DECOMP/DISCECTOMY FUSION  2001   BREAST BIOPSY     CARDIOVASCULAR STRESS TEST  06-29-2002   EF is normal at 55%   CHOLECYSTECTOMY N/A 07/14/2020   Procedure: LAPAROSCOPIC CHOLECYSTECTOMY;  Surgeon: Berna Bue, MD;  Location: [facility];  Service: General;  Laterality: N/A;   DILATION AND CURETTAGE OF UTERUS     FOOT SURGERY Left 02/2014   KNEE ARTHROSCOPY Left 1980's   KNEE ARTHROSCOPY WITH MENISCAL REPAIR Right 2/14   LAPAROSCOPY     multiple s/p infertility   PILONIDAL CYST EXCISION     TONSILLECTOMY  1960's   Social History   Socioeconomic History   Marital status: Married    Spouse name: Not on file   Number of children: 1   Years of education: BA   Highest education level: Not on file  Occupational History   Not on file  Tobacco Use   Smoking status: Never    Passive exposure: Never   Smokeless tobacco: Never  Vaping Use   Vaping Use: Never used  Substance and Sexual Activity   Alcohol use: Yes    Alcohol/week: 7.0 standard drinks    Types: 7 Glasses of wine per  week   Drug use: No   Sexual activity: Yes    Partners: Male  Other Topics Concern   Not on file  Social History Narrative   Patient is married with 1 biological child, and 1 adopted child.   Patient is right handed.   Patient has her Bachelor's degree.   Patient drinks 1 cup daily.   Social Determinants of Health   Financial Resource Strain: Not on file  Food Insecurity: Not on file  Transportation Needs: Not on file  Physical Activity: Not on file  Stress: Not on file  Social Connections: Not on file   Allergies  Allergen Reactions   Lipitor [Atorvastatin] Other (See Comments)    Leg cramps   Onion Nausea And Vomiting   Bactrim [Sulfamethoxazole-Trimethoprim] Other (See Comments)    Muscle issues   Family History  Problem Relation Age of Onset    Heart Problems Mother        aortic valve replacement   Diabetes Father    Hypertension Father    CVA Father    Hypertension Paternal Grandmother    CVA Paternal Grandmother    Breast cancer Maternal Aunt     Current Outpatient Medications (Endocrine & Metabolic):    Dulaglutide 1.5 QI/3.4VQ SOPN, Inject 1.5 mg into the skin once a week.   metFORMIN (GLUCOPHAGE) 500 MG tablet, Take 500 mg by mouth 2 (two) times daily with a meal.  Current Outpatient Medications (Cardiovascular):    losartan (COZAAR) 50 MG tablet, Take 50 mg by mouth daily.   rosuvastatin (CRESTOR) 5 MG tablet, Take 5 mg by mouth every morning.   Current Outpatient Medications (Analgesics):    diclofenac (VOLTAREN) 75 MG EC tablet, Take 1 tablet (75 mg total) by mouth 2 (two) times daily.  Current Outpatient Medications (Hematological):    clopidogrel (PLAVIX) 75 MG tablet, Take 1 tablet (75 mg total) by mouth daily.  Current Outpatient Medications (Other):    B Complex Vitamins (VITAMIN B COMPLEX PO), Take 1 tablet by mouth daily.   Calcium Carbonate (CALCIUM 600 PO), Take 600 mg by mouth 2 (two) times daily.   Multiple Vitamins-Minerals (HAIR SKIN AND NAILS FORMULA PO), Take 1 tablet by mouth daily.   nitrofurantoin, macrocrystal-monohydrate, (MACROBID) 100 MG capsule, Take 1 capsule (100 mg total) by mouth 2 (two) times daily.   potassium chloride SA (K-DUR,KLOR-CON) 20 MEQ tablet, Take 20 mEq by mouth 2 (two) times daily.   VITAMIN D PO, Take 1,000 Units by mouth daily.   zolpidem (AMBIEN) 10 MG tablet, Take 5 mg by mouth at bedtime.   Reviewed prior external information including notes and imaging from  primary care provider As well as notes that were available from care everywhere and other healthcare systems.  Past medical history, social, surgical and family history all reviewed in electronic medical record.  No pertanent information unless stated regarding to the chief complaint.   Review of  Systems:  No headache, visual changes, nausea, vomiting, diarrhea, constipation, dizziness, abdominal pain, skin rash, fevers, chills, night sweats, weight loss, swollen lymph nodes, body aches, joint swelling, chest pain, shortness of breath, mood changes. POSITIVE muscle aches  Objective  Blood pressure 110/84, pulse 80, height 5\' 2"  (1.575 m), weight 215 lb (97.5 kg), last menstrual period 02/05/2006, SpO2 98 %.   General: No apparent distress alert and oriented x3 mood and affect normal, dressed appropriately.  HEENT: Pupils equal, extraocular movements intact  Respiratory: Patient's speak in full sentences and does not appear  short of breath  Cardiovascular: No lower extremity edema, non tender, no erythema  Gait normal with good balance and coordination.  MSK: Patient does have tenderness to palpation of the paraspinal musculature of the lumbar spine.  Patient does have severe tenderness over the greater trochanteric area bilaterally. Patient does have tenderness noted over the greater trochanteric area bilaterally.  Tightness with Pearlean BrownieFaber bilaterally.  After verbal consent patient was prepped in sterile fashion with alcohol swabs. Ethyl chloride used patient was injected with a 22-gauge 3 inch needle into the RIGHT lateral hip in the greater trochanteric area under ultrasound guidance. Picture was taken. Patient had 4 cc of 0.5% Marcaine and 1 cc of Kenalog 40 mg/dL injected. Patient tolerated the procedure well and no blood loss. Pain completely resolved after injection stating proper placement. Post injection instructions given.  After verbal consent patient was prepped in sterile fashion with alcohol swabs. Ethyl chloride used patient was injected with a 22-gauge 3 inch needle into the left lateral hip in the greater trochanteric area under ultrasound guidance. Picture was taken. Patient had 4 cc of 0.5% Marcaine and 1 cc of Kenalog 40 mg/dL injected. Patient tolerated the procedure well and  no blood loss. Pain completely resolved after injection stating proper placement. Post injection instructions given.  [id number]; 15 additional minutes spent for Therapeutic exercises as stated in above notes.  This included exercises focusing on stretching, strengthening, with significant focus on eccentric aspects.   Long term goals include an improvement in range of motion, strength, endurance as well as avoiding reinjury. Patient's frequency would include in 1-2 times a day, 3-5 times a week for a duration of 6-12 weeks.  Proper technique shown and discussed handout in great detail with ATC.  All questions were discussed and answered.      Impression and Recommendations:    The above documentation has been reviewed and is accurate and complete Judi SaaZachary M Naava Janeway, DO

## 2021-02-27 DIAGNOSIS — G479 Sleep disorder, unspecified: Secondary | ICD-10-CM | POA: Diagnosis not present

## 2021-02-27 DIAGNOSIS — D692 Other nonthrombocytopenic purpura: Secondary | ICD-10-CM | POA: Diagnosis not present

## 2021-02-27 DIAGNOSIS — E7800 Pure hypercholesterolemia, unspecified: Secondary | ICD-10-CM | POA: Diagnosis not present

## 2021-02-27 DIAGNOSIS — E1169 Type 2 diabetes mellitus with other specified complication: Secondary | ICD-10-CM | POA: Diagnosis not present

## 2021-02-27 DIAGNOSIS — M5416 Radiculopathy, lumbar region: Secondary | ICD-10-CM | POA: Diagnosis not present

## 2021-02-27 DIAGNOSIS — G43809 Other migraine, not intractable, without status migrainosus: Secondary | ICD-10-CM | POA: Diagnosis not present

## 2021-02-27 DIAGNOSIS — H9193 Unspecified hearing loss, bilateral: Secondary | ICD-10-CM | POA: Diagnosis not present

## 2021-02-27 DIAGNOSIS — J302 Other seasonal allergic rhinitis: Secondary | ICD-10-CM | POA: Diagnosis not present

## 2021-02-27 DIAGNOSIS — N390 Urinary tract infection, site not specified: Secondary | ICD-10-CM | POA: Diagnosis not present

## 2021-02-27 DIAGNOSIS — I10 Essential (primary) hypertension: Secondary | ICD-10-CM | POA: Diagnosis not present

## 2021-02-27 DIAGNOSIS — G459 Transient cerebral ischemic attack, unspecified: Secondary | ICD-10-CM | POA: Diagnosis not present

## 2021-02-28 ENCOUNTER — Encounter: Payer: Self-pay | Admitting: Family Medicine

## 2021-02-28 ENCOUNTER — Other Ambulatory Visit: Payer: Self-pay

## 2021-02-28 ENCOUNTER — Ambulatory Visit: Payer: HMO | Admitting: Family Medicine

## 2021-02-28 DIAGNOSIS — M7062 Trochanteric bursitis, left hip: Secondary | ICD-10-CM | POA: Diagnosis not present

## 2021-02-28 DIAGNOSIS — M7061 Trochanteric bursitis, right hip: Secondary | ICD-10-CM

## 2021-02-28 NOTE — Patient Instructions (Signed)
Exercises Injected both Gts today If not better in 2 weeks write and we will order epidural Tart Cherry Extract 1200mg  at night Consider pelvic floor PT See me again in 3-4 weeks

## 2021-02-28 NOTE — Assessment & Plan Note (Addendum)
Bilateral injections given today, tolerated the procedure well, discussed icing regimen and home exercises, discussed avoiding certain activities.  Did discuss with patient about the MRI at this point does show that there is some spinal stenosis at L4-L5 that could also be consistent with the hip pain.  May want to consider the possibility of injections.  Patient also has tried gabapentin previously but states that it did not help and it made her not feel well.  Discussed with patient also having chronic urinary tract infections we could potentially consider pelvic floor physical therapy but at the moment we will start with home exercises.  Follow-up with me again in 6 to 8 weeks.

## 2021-03-20 DIAGNOSIS — H903 Sensorineural hearing loss, bilateral: Secondary | ICD-10-CM | POA: Diagnosis not present

## 2021-03-20 DIAGNOSIS — E119 Type 2 diabetes mellitus without complications: Secondary | ICD-10-CM | POA: Diagnosis not present

## 2021-03-20 NOTE — Progress Notes (Signed)
[facility] [address] [place] [address] Phone: [phone] Subjective:   Christina Wong, am serving as a scribe for Dr. Antoine Primas. This visit occurred during the SARS-CoV-2 public health emergency.  Safety protocols were in place, including screening questions prior to the visit, additional usage of staff PPE, and extensive cleaning of exam room while observing appropriate contact time as indicated for disinfecting solutions.   I'm seeing this patient by the request  of:  [name], MD  CC: hip pain   [id number]  02/28/2021 Bilateral injections given today, tolerated the procedure well, discussed icing regimen and home exercises, discussed avoiding certain activities.  Did discuss with patient about the MRI at this point does show that there is some spinal stenosis at L4-L5 that could also be consistent with the hip pain.  May want to consider the possibility of injections.  Patient also has tried gabapentin previously but states that it did not help and it made her not feel well.  Discussed with patient also having chronic urinary tract infections we could potentially consider pelvic floor physical therapy but at the moment we will start with home exercises.  Follow-up with me again in 6 to 8 weeks.  Updated 03/21/2021 Christina Wong is a 71 y.o. female coming in with complaint of hip pain found to have bilateral greater trochanteric bursitis.  Given injection 4 weeks ago. Did not have any relief from injections. Pain is constant but worse with standing in one place and going up stairs. Tried tart cherry extract and she developed leg cramps so discontinued medication. Does feel that HEP is helpful in managing pain.        Past Medical History:  Diagnosis Date   Aphasia 07/11/2013   x 10-15 min   Elevated liver function tests    One of her studies are slightly elevated which may represent fatty liver from her Diabetes and her obesity or  may represent an adverse effect from the Crestor.   Exogenous obesity    She has had a problem with   Headache(784.0)    "daily to weekly lately" (09/02/2013)   Hypercholesterolemia    Hypertension    hx of essential- on meds   Infertility    Leg cramps    in last 6 months   Neck stiffness 07/13/2014   and neck pain   PONV (postoperative nausea and vomiting)    TIA (transient ischemic attack) 09/01/2013   "that's what they think"   Type II diabetes mellitus (HCC)    mild   Walking pneumonia ~ 1972   Past Surgical History:  Procedure Laterality Date   ANKLE FRACTURE SURGERY Left ~ 2007   ANTERIOR CERVICAL DECOMP/DISCECTOMY FUSION  2001   BREAST BIOPSY     CARDIOVASCULAR STRESS TEST  06-29-2002   EF is normal at 55%   CHOLECYSTECTOMY N/A 07/14/2020   Procedure: LAPAROSCOPIC CHOLECYSTECTOMY;  Surgeon: Berna Bue, MD;  Location: [facility];  Service: General;  Laterality: N/A;   DILATION AND CURETTAGE OF UTERUS     FOOT SURGERY Left 02/2014   KNEE ARTHROSCOPY Left 1980's   KNEE ARTHROSCOPY WITH MENISCAL REPAIR Right 2/14   LAPAROSCOPY     multiple s/p infertility   PILONIDAL CYST EXCISION     TONSILLECTOMY  1960's   Social History   Socioeconomic History   Marital status: Married    Spouse name: Not on file   Number of children: 1   Years  of education: BA   Highest education level: Not on file  Occupational History   Not on file  Tobacco Use   Smoking status: Never    Passive exposure: Never   Smokeless tobacco: Never  Vaping Use   Vaping Use: Never used  Substance and Sexual Activity   Alcohol use: Yes    Alcohol/week: 7.0 standard drinks    Types: 7 Glasses of wine per week   Drug use: No   Sexual activity: Yes    Partners: Male  Other Topics Concern   Not on file  Social History Narrative   Patient is married with 1 biological child, and 1 adopted child.   Patient is right handed.   Patient has her Bachelor's degree.   Patient drinks 1 cup daily.    Social Determinants of Health   Financial Resource Strain: Not on file  Food Insecurity: Not on file  Transportation Needs: Not on file  Physical Activity: Not on file  Stress: Not on file  Social Connections: Not on file   Allergies  Allergen Reactions   Lipitor [Atorvastatin] Other (See Comments)    Leg cramps   Onion Nausea And Vomiting   Bactrim [Sulfamethoxazole-Trimethoprim] Other (See Comments)    Muscle issues   Family History  Problem Relation Age of Onset   Heart Problems Mother        aortic valve replacement   Diabetes Father    Hypertension Father    CVA Father    Hypertension Paternal Grandmother    CVA Paternal Grandmother    Breast cancer Maternal Aunt     Current Outpatient Medications (Endocrine & Metabolic):    Dulaglutide 1.5 TM/5.4YT SOPN, Inject 1.5 mg into the skin once a week.   metFORMIN (GLUCOPHAGE) 500 MG tablet, Take 500 mg by mouth 2 (two) times daily with a meal.  Current Outpatient Medications (Cardiovascular):    losartan (COZAAR) 50 MG tablet, Take 50 mg by mouth daily.   rosuvastatin (CRESTOR) 5 MG tablet, Take 5 mg by mouth every morning.    Current Outpatient Medications (Hematological):    clopidogrel (PLAVIX) 75 MG tablet, Take 1 tablet (75 mg total) by mouth daily.  Current Outpatient Medications (Other):    B Complex Vitamins (VITAMIN B COMPLEX PO), Take 1 tablet by mouth daily.   Calcium Carbonate (CALCIUM 600 PO), Take 600 mg by mouth 2 (two) times daily.   Multiple Vitamins-Minerals (HAIR SKIN AND NAILS FORMULA PO), Take 1 tablet by mouth daily.   nitrofurantoin, macrocrystal-monohydrate, (MACROBID) 100 MG capsule, Take 1 capsule (100 mg total) by mouth 2 (two) times daily.   potassium chloride SA (K-DUR,KLOR-CON) 20 MEQ tablet, Take 20 mEq by mouth 2 (two) times daily.   VITAMIN D PO, Take 1,000 Units by mouth daily.   zolpidem (AMBIEN) 10 MG tablet, Take 5 mg by mouth at bedtime.   Reviewed prior external information  including notes and imaging from  primary care provider As well as notes that were available from care everywhere and other healthcare systems.  Past medical history, social, surgical and family history all reviewed in electronic medical record.  No pertanent information unless stated regarding to the chief complaint.   Review of Systems:  No headache, visual changes, nausea, vomiting, diarrhea, constipation, dizziness, abdominal pain, skin rash, fevers, chills, night sweats, weight loss, swollen lymph nodes, body aches, joint swelling, chest pain, shortness of breath, mood changes. POSITIVE muscle aches  Objective  Blood pressure 110/78, pulse 83, height 5\' 2"  (  1.575 m), weight 214 lb (97.1 kg), last menstrual period 02/05/2006, SpO2 97 %.   General: No apparent distress alert and oriented x3 mood and affect normal, dressed appropriately.  HEENT: Pupils equal, extraocular movements intact  Respiratory: Patient's speak in full sentences and does not appear short of breath  Cardiovascular: No lower extremity edema, non tender, no erythema  Gait normal with good balance and coordination.  MSK: Patient does have loss of lordosis.  Seems somewhat uncomfortable in the sitting position.  Still tender over the lateral aspect of the hips.  Does have difficulty with getting out of a chair mildly.    Impression and Recommendations:     The above documentation has been reviewed and is accurate and complete Judi Saa, DO

## 2021-03-21 ENCOUNTER — Other Ambulatory Visit: Payer: Self-pay

## 2021-03-21 ENCOUNTER — Ambulatory Visit: Payer: Self-pay

## 2021-03-21 ENCOUNTER — Ambulatory Visit: Payer: HMO | Admitting: Family Medicine

## 2021-03-21 ENCOUNTER — Encounter: Payer: Self-pay | Admitting: Family Medicine

## 2021-03-21 VITALS — BP 110/78 | HR 83 | Ht 62.0 in | Wt 214.0 lb

## 2021-03-21 DIAGNOSIS — M25552 Pain in left hip: Secondary | ICD-10-CM | POA: Diagnosis not present

## 2021-03-21 DIAGNOSIS — M25551 Pain in right hip: Secondary | ICD-10-CM

## 2021-03-21 DIAGNOSIS — M48062 Spinal stenosis, lumbar region with neurogenic claudication: Secondary | ICD-10-CM | POA: Diagnosis not present

## 2021-03-21 NOTE — Patient Instructions (Addendum)
PT with [facility] If pain worsen injections sooner than later See you again in 4 weeks

## 2021-03-21 NOTE — Assessment & Plan Note (Signed)
Patient does have signs and symptoms consistent with more of a lumbar spinal stenosis.  Discussed with patient about icing regimen and home exercises, which activities to do which ones to avoid.  Increase activity slowly over the course of the next several weeks.  I do feel that different treatment options are possible for her.  Patient at the moment would like to try formal physical therapy and will be referred appropriately.  We did discuss that I do feel an epidural would be helpful but patient wants to hold on it at the moment.  We will discuss again in follow-up in 4 to 6 weeks.  Total time discussing with patient and reviewing patient's previous imaging greater than 31 minutes

## 2021-03-31 ENCOUNTER — Telehealth (HOSPITAL_BASED_OUTPATIENT_CLINIC_OR_DEPARTMENT_OTHER): Payer: Self-pay | Admitting: Obstetrics & Gynecology

## 2021-03-31 NOTE — Telephone Encounter (Signed)
Patient called and would like for the nurse to please call her she thinks she has UTI.

## 2021-03-31 NOTE — Telephone Encounter (Signed)
Pt complains of urinary frequency and some pain with urination, stating that she feels like she may be getting a UTI. She states that Dr. Hyacinth Meeker told her to call the next time she had symptoms. Advised that Dr. Hyacinth Meeker sent in a prescription and she should let us know the next time she has symptoms so that we can have her come in and provide [place] with a urine sample. Pt verbalized understanding.

## 2021-04-10 DIAGNOSIS — M25551 Pain in right hip: Secondary | ICD-10-CM | POA: Diagnosis not present

## 2021-04-10 DIAGNOSIS — M25552 Pain in left hip: Secondary | ICD-10-CM | POA: Diagnosis not present

## 2021-04-12 ENCOUNTER — Telehealth (HOSPITAL_BASED_OUTPATIENT_CLINIC_OR_DEPARTMENT_OTHER): Payer: Self-pay | Admitting: *Deleted

## 2021-04-12 ENCOUNTER — Other Ambulatory Visit (HOSPITAL_BASED_OUTPATIENT_CLINIC_OR_DEPARTMENT_OTHER): Payer: Self-pay | Admitting: *Deleted

## 2021-04-12 DIAGNOSIS — N390 Urinary tract infection, site not specified: Secondary | ICD-10-CM

## 2021-04-12 MED ORDER — NITROFURANTOIN MONOHYD MACRO 100 MG PO CAPS: 100.0000 mg | ORAL_CAPSULE | Freq: Two times a day (BID) | ORAL | 1 refills | Status: DC

## 2021-04-12 NOTE — Telephone Encounter (Signed)
Pt called stating that [facility] never received the prescription that Dr. Hyacinth Meeker sent of treatment of recurrent UTIs. She started having symptoms so she took what she had left that her PCP had prescribed her. Pt is to finish that dosage tomorrow. Advised that I would send in the prescription that Dr. Hyacinth Meeker had previously sent but when she has symptoms of a uti, she should come to the office to provide [place] with a  urine sample so that we can best treat her. Pt verbalized understanding.  ?

## 2021-04-17 DIAGNOSIS — M25552 Pain in left hip: Secondary | ICD-10-CM | POA: Diagnosis not present

## 2021-04-17 DIAGNOSIS — M25551 Pain in right hip: Secondary | ICD-10-CM | POA: Diagnosis not present

## 2021-04-25 ENCOUNTER — Ambulatory Visit: Payer: HMO | Admitting: Family Medicine

## 2021-04-26 ENCOUNTER — Other Ambulatory Visit: Payer: Self-pay

## 2021-04-26 ENCOUNTER — Ambulatory Visit: Payer: PPO | Admitting: Neurology

## 2021-04-26 ENCOUNTER — Telehealth: Payer: Self-pay | Admitting: Neurology

## 2021-04-26 VITALS — BP 155/78 | HR 75 | Ht 62.0 in | Wt 214.0 lb

## 2021-04-26 DIAGNOSIS — R4701 Aphasia: Secondary | ICD-10-CM

## 2021-04-26 DIAGNOSIS — M25551 Pain in right hip: Secondary | ICD-10-CM | POA: Diagnosis not present

## 2021-04-26 DIAGNOSIS — M25552 Pain in left hip: Secondary | ICD-10-CM | POA: Diagnosis not present

## 2021-04-26 DIAGNOSIS — G43009 Migraine without aura, not intractable, without status migrainosus: Secondary | ICD-10-CM | POA: Diagnosis not present

## 2021-04-26 DIAGNOSIS — G459 Transient cerebral ischemic attack, unspecified: Secondary | ICD-10-CM

## 2021-04-26 MED ORDER — ASPIRIN EC 81 MG PO TBEC: 81.0000 mg | DELAYED_RELEASE_TABLET | Freq: Every day | ORAL | 2 refills | Status: AC

## 2021-04-26 NOTE — Telephone Encounter (Signed)
HTA order sent to [facility], [facility] they will reach out to the patient to schedule.  

## 2021-04-26 NOTE — Progress Notes (Signed)
[facility] [address] [place]. [place] [address] [phone]       OFFICE CONSULT NOTE  [name] Date of Birth:  August 01, 1950 Medical Record Number:  [id number]   Referring MD: Lorin Glass  Reason for Referral:  Aphasia  HPI: Christina Wong is a 71 year old pleasant Caucasian lady with past medical history of diabetes, hypertension, hyperlipidemia and TIA versus complicated migraine.  History is obtained from the patient and review of electronic medical records and opossum reviewed pertinent available imaging films in PACS.  She presented to [facility] ED on 02/14/2021 with difficulty speaking.  She states her last known normal was the night before when she went to bed.  She woke up in the morning and had a mild headache and was not feeling well so she went back to sleep.  A few hours later when she woke up she tried to speak to the husband and noticed difficulty finding her words and continues to have a mild headache.  She denies any trouble with vision or extremity weakness numbness or gait problems.  She has had similar problems in 2015 for which she was evaluated by me in brain imaging and neurovascular studies were unremarkable.  She was thought to have complicated migraine.  She has lost to follow-up after last office visit in 2017.  She had been advised to take Plavix which she ran out of her prescription and plan her primary care physician did not fill it.  Patient has CT scan of the head at [facility] ED which showed a small hemorrhagic lesion in the medial temporal lobe and she was transferred to [facility] for MRI scan which however did not show any acute hemorrhage or acute abnormality and only changes of small vessel disease.  Patient states she is done well since then and has had no recurrent episodes of either headaches or speech or language difficulties.  This whole episode lasted a couple of hours and was quite similar to the episode she did have previously  in 2015 when she first saw me.  In some of those episodes she lost her peripheral vision and had some blurred vision in addition to speech difficulties.  MRI brain and MRA of the brain on 09/02/2013 were normal.  She was initially thought to have left brain TIA in fact participate in the Socrates study for stroke prevention.  She has no prior history of migraine headaches and there is no strong family history of migraines either. ROS:   14 system review of systems is positive for speech difficulty, dysarthria, trouble finding words, headache and all other systems negative  PMH:  Past Medical History:  Diagnosis Date   Aphasia 07/11/2013   x 10-15 min   Elevated liver function tests    One of her studies are slightly elevated which may represent fatty liver from her Diabetes and her obesity or may represent an adverse effect from the Crestor.   Exogenous obesity    She has had a problem with   Headache(784.0)    "daily to weekly lately" (09/02/2013)   Hypercholesterolemia    Hypertension    hx of essential- on meds   Infertility    Leg cramps    in last 6 months   Neck stiffness 07/13/2014   and neck pain   PONV (postoperative nausea and vomiting)    TIA (transient ischemic attack) 09/01/2013   "that's what they think"   Type II diabetes mellitus (HCC)  mild   Walking pneumonia ~ 1972    Social History:  Social History   Socioeconomic History   Marital status: Married    Spouse name: Not on file   Number of children: 1   Years of education: BA   Highest education level: Not on file  Occupational History   Not on file  Tobacco Use   Smoking status: Never    Passive exposure: Never   Smokeless tobacco: Never  Vaping Use   Vaping Use: Never used  Substance and Sexual Activity   Alcohol use: Yes    Alcohol/week: 7.0 standard drinks    Types: 7 Glasses of wine per week   Drug use: No   Sexual activity: Yes    Partners: Male  Other Topics Concern   Not on file   Social History Narrative   Patient is married with 1 biological child, and 1 adopted child.   Patient is right handed.   Patient has her Bachelor's degree.   Patient drinks 1 cup daily.   Social Determinants of Health   Financial Resource Strain: Not on file  Food Insecurity: Not on file  Transportation Needs: Not on file  Physical Activity: Not on file  Stress: Not on file  Social Connections: Not on file  Intimate Partner Violence: Not on file    Medications:   Current Outpatient Medications on File Prior to Visit  Medication Sig Dispense Refill   B Complex Vitamins (VITAMIN B COMPLEX PO) Take 1 tablet by mouth daily.     Calcium Carbonate (CALCIUM 600 PO) Take 600 mg by mouth 2 (two) times daily.     Dulaglutide 1.5 MG/0.5ML SOPN Inject 1.5 mg into the skin once a week.     losartan (COZAAR) 50 MG tablet Take 50 mg by mouth daily.     metFORMIN (GLUCOPHAGE) 500 MG tablet Take 500 mg by mouth 2 (two) times daily with a meal.     Multiple Vitamins-Minerals (HAIR SKIN AND NAILS FORMULA PO) Take 1 tablet by mouth daily.     nitrofurantoin, macrocrystal-monohydrate, (MACROBID) 100 MG capsule Take 1 capsule (100 mg total) by mouth 2 (two) times daily. 10 capsule 1   potassium chloride SA (K-DUR,KLOR-CON) 20 MEQ tablet Take 20 mEq by mouth 2 (two) times daily.     rosuvastatin (CRESTOR) 5 MG tablet Take 5 mg by mouth every morning.     VITAMIN D PO Take 1,000 Units by mouth daily.     zolpidem (AMBIEN) 10 MG tablet Take 5 mg by mouth at bedtime.     [DISCONTINUED] Saxagliptin HCl (ONGLYZA PO) Take 1,000 mg by mouth.       No current facility-administered medications on file prior to visit.    Allergies:   Allergies  Allergen Reactions   Lipitor [Atorvastatin] Other (See Comments)    Leg cramps   Onion Nausea And Vomiting   Bactrim [Sulfamethoxazole-Trimethoprim] Other (See Comments)    Muscle issues    Physical Exam General: Obese elderly Caucasian lady seated, in no  evident distress Head: head normocephalic and atraumatic.   Neck: supple with no carotid or supraclavicular bruits Cardiovascular: regular rate and rhythm, no murmurs Musculoskeletal: no deformity Skin:  no rash/petichiae Vascular:  Normal pulses all extremities  Neurologic Exam Mental Status: Awake and fully alert. Oriented to place and time. Recent and remote memory intact. Attention span, concentration and fund of knowledge appropriate. Mood and affect appropriate.  Cranial Nerves: Fundoscopic exam reveals sharp disc margins. Pupils equal,  briskly reactive to light. Extraocular movements full without nystagmus. Visual fields full to confrontation. Hearing intact. Facial sensation intact. Face, tongue, palate moves normally and symmetrically.  Motor: Normal bulk and tone. Normal strength in all tested extremity muscles. Sensory.: intact to touch , pinprick , position and vibratory sensation.  Coordination: Rapid alternating movements normal in all extremities. Finger-to-nose and heel-to-shin performed accurately bilaterally. Gait and Station: Arises from chair without difficulty. Stance is normal. Gait demonstrates normal stride length and balance . Able to heel, toe and tandem walk with difficulty.  Reflexes: 1+ and symmetric. Toes downgoing.   NIHSS  0 Modified Rankin  0   ASSESSMENT: 70 year old17 Caucasian lady with transient episode of expressive aphasia and speech difficulties with a mild headache likely a complicated migraine episode particularly since she has had similar episodes in the past.  However left hemispheric TIA is also possibility given advanced age and risk factors     PLAN:I had a long discussion with the patient regarding her episode of transient speech disturbance along with mild headache likely representing complicated migraine episode rather than left hemispheric TIA particularly since she has had similar stereotypical episodes in the past.  Since his episodes are  quite infrequent there is no role for prophylactic medications at the present time but I advised her to avoid processed possible triggers.  I would recommend further evaluation by checking CT angiogram of the brain and neck for occlusive vascular disease as well as checking EEG for seizure activity.  Recommend she change Plavix to aspirin 81 mg daily for stroke prevention and maintain aggressive risk factor modification strict control of hypertension with blood pressure goal below 130/90, lipids with LDL cholesterol goal below 70 mg percent and diabetes with hemoglobin A1c goal below 6.5%.  She will return for follow-up in the future only as needed and no schedule appointment was made.  Greater than 50% time during this 45-minute consultation visit was spent on counseling and coordination of care about TIA versus complicated migraine and answering questions. Delia HeadyPramod Kennidi Yoshida, MD  Note: This document was prepared with digital dictation and possible smart phrase technology. Any transcriptional errors that result from this process are unintentional.

## 2021-04-26 NOTE — Patient Instructions (Signed)
I had a long discussion with the patient regarding her episode of transient speech disturbance along with mild headache likely representing complicated migraine episode rather than left hemispheric TIA particularly since she has had similar stereotypical episodes in the past.  Since his episodes are quite infrequent there is no role for prophylactic medications at the present time but I advised her to avoid processed possible triggers.  I would recommend further evaluation by checking CT angiogram of the brain and neck for occlusive vascular disease as well as checking EEG for seizure activity.  Recommend she change Plavix to aspirin 81 mg daily for stroke prevention and maintain aggressive risk factor modification strict control of hypertension with blood pressure goal below 130/90, lipids with LDL cholesterol goal below 70 mg percent and diabetes with hemoglobin A1c goal below 6.5%.  She will return for follow-up in the future only as needed and no schedule appointment was made. ?

## 2021-05-01 ENCOUNTER — Other Ambulatory Visit: Payer: PPO | Admitting: Neurology

## 2021-05-01 DIAGNOSIS — R4701 Aphasia: Secondary | ICD-10-CM

## 2021-05-02 DIAGNOSIS — M5416 Radiculopathy, lumbar region: Secondary | ICD-10-CM | POA: Diagnosis not present

## 2021-05-11 DIAGNOSIS — M5416 Radiculopathy, lumbar region: Secondary | ICD-10-CM | POA: Diagnosis not present

## 2021-05-15 DIAGNOSIS — H401131 Primary open-angle glaucoma, bilateral, mild stage: Secondary | ICD-10-CM | POA: Diagnosis not present

## 2021-05-18 ENCOUNTER — Telehealth (HOSPITAL_BASED_OUTPATIENT_CLINIC_OR_DEPARTMENT_OTHER): Payer: Self-pay | Admitting: *Deleted

## 2021-05-18 ENCOUNTER — Ambulatory Visit
Admission: RE | Admit: 2021-05-18 | Discharge: 2021-05-18 | Disposition: A | Discharge status: Home / Self Care | Payer: HMO | Source: Ambulatory Visit | Attending: Neurology | Admitting: Neurology

## 2021-05-18 DIAGNOSIS — I700 Atherosclerosis of aorta: Secondary | ICD-10-CM | POA: Diagnosis not present

## 2021-05-18 DIAGNOSIS — I6523 Occlusion and stenosis of bilateral carotid arteries: Secondary | ICD-10-CM | POA: Diagnosis not present

## 2021-05-18 DIAGNOSIS — Z8673 Personal history of transient ischemic attack (TIA), and cerebral infarction without residual deficits: Secondary | ICD-10-CM | POA: Diagnosis not present

## 2021-05-18 DIAGNOSIS — I739 Peripheral vascular disease, unspecified: Secondary | ICD-10-CM | POA: Diagnosis not present

## 2021-05-18 MED ORDER — IOPAMIDOL (ISOVUE-370) INJECTION 76%
100.0000 mL | Freq: Once | INTRAVENOUS | Status: AC | PRN
  Administered 2021-05-18: 100 mL via INTRAVENOUS

## 2021-05-18 MED ORDER — PHENAZOPYRIDINE HCL 200 MG PO TABS: 200.0000 mg | ORAL_TABLET | Freq: Three times a day (TID) | ORAL | 0 refills | Status: AC | PRN

## 2021-05-18 NOTE — Telephone Encounter (Signed)
TC from pt - DOB verified. Began with SX's of UTI yesterday evening.  Had a prescription Dr Hyacinth Meeker previously gave her of Macrobid 100mg  therefore began to take, has now taken 2 doses and a pyridium.  Needs refill on Pyridium but not on Macrobid.  With patient already taken medication, no reason to come in for UA sample.  Patient leaving for on Monday for a 2.5 week trip.  Pt now has had UTI's in 5 out of the last 6 months.  Would like recommendation on who to see for workup.  Pt will call back when she returns from her trip to see who Dr Sunday recommends. KW CMA ?

## 2021-06-09 DIAGNOSIS — H1045 Other chronic allergic conjunctivitis: Secondary | ICD-10-CM | POA: Diagnosis not present

## 2021-06-12 DIAGNOSIS — H1045 Other chronic allergic conjunctivitis: Secondary | ICD-10-CM | POA: Diagnosis not present

## 2021-06-13 DIAGNOSIS — U071 COVID-19: Secondary | ICD-10-CM | POA: Diagnosis not present

## 2021-06-19 DIAGNOSIS — H1045 Other chronic allergic conjunctivitis: Secondary | ICD-10-CM | POA: Diagnosis not present

## 2021-06-20 DIAGNOSIS — M5416 Radiculopathy, lumbar region: Secondary | ICD-10-CM | POA: Diagnosis not present

## 2021-06-26 DIAGNOSIS — H1033 Unspecified acute conjunctivitis, bilateral: Secondary | ICD-10-CM | POA: Diagnosis not present

## 2021-07-05 DIAGNOSIS — M5416 Radiculopathy, lumbar region: Secondary | ICD-10-CM | POA: Diagnosis not present

## 2021-07-11 DIAGNOSIS — H401131 Primary open-angle glaucoma, bilateral, mild stage: Secondary | ICD-10-CM | POA: Diagnosis not present

## 2021-07-11 DIAGNOSIS — M25562 Pain in left knee: Secondary | ICD-10-CM | POA: Diagnosis not present

## 2021-07-28 DIAGNOSIS — M25562 Pain in left knee: Secondary | ICD-10-CM | POA: Diagnosis not present

## 2021-09-04 DIAGNOSIS — E119 Type 2 diabetes mellitus without complications: Secondary | ICD-10-CM | POA: Diagnosis not present

## 2021-09-07 DIAGNOSIS — J302 Other seasonal allergic rhinitis: Secondary | ICD-10-CM | POA: Diagnosis not present

## 2021-09-07 DIAGNOSIS — I10 Essential (primary) hypertension: Secondary | ICD-10-CM | POA: Diagnosis not present

## 2021-09-07 DIAGNOSIS — Z0000 Encounter for general adult medical examination without abnormal findings: Secondary | ICD-10-CM | POA: Diagnosis not present

## 2021-09-07 DIAGNOSIS — E1169 Type 2 diabetes mellitus with other specified complication: Secondary | ICD-10-CM | POA: Diagnosis not present

## 2021-09-07 DIAGNOSIS — D692 Other nonthrombocytopenic purpura: Secondary | ICD-10-CM | POA: Diagnosis not present

## 2021-09-07 DIAGNOSIS — E7800 Pure hypercholesterolemia, unspecified: Secondary | ICD-10-CM | POA: Diagnosis not present

## 2021-09-07 DIAGNOSIS — Z1331 Encounter for screening for depression: Secondary | ICD-10-CM | POA: Diagnosis not present

## 2021-09-07 DIAGNOSIS — G43809 Other migraine, not intractable, without status migrainosus: Secondary | ICD-10-CM | POA: Diagnosis not present

## 2021-09-07 DIAGNOSIS — M5416 Radiculopathy, lumbar region: Secondary | ICD-10-CM | POA: Diagnosis not present

## 2021-09-07 DIAGNOSIS — G479 Sleep disorder, unspecified: Secondary | ICD-10-CM | POA: Diagnosis not present

## 2021-09-08 ENCOUNTER — Other Ambulatory Visit: Payer: Self-pay | Admitting: Family Medicine

## 2021-09-08 ENCOUNTER — Other Ambulatory Visit: Payer: Self-pay | Admitting: Obstetrics & Gynecology

## 2021-09-08 DIAGNOSIS — E2839 Other primary ovarian failure: Secondary | ICD-10-CM

## 2021-09-21 DIAGNOSIS — E8889 Other specified metabolic disorders: Secondary | ICD-10-CM | POA: Diagnosis not present

## 2021-09-21 DIAGNOSIS — I10 Essential (primary) hypertension: Secondary | ICD-10-CM | POA: Diagnosis not present

## 2021-09-21 DIAGNOSIS — E1169 Type 2 diabetes mellitus with other specified complication: Secondary | ICD-10-CM | POA: Diagnosis not present

## 2021-09-21 DIAGNOSIS — E669 Obesity, unspecified: Secondary | ICD-10-CM | POA: Diagnosis not present

## 2021-09-21 DIAGNOSIS — Z1331 Encounter for screening for depression: Secondary | ICD-10-CM | POA: Diagnosis not present

## 2021-09-25 ENCOUNTER — Encounter (HOSPITAL_BASED_OUTPATIENT_CLINIC_OR_DEPARTMENT_OTHER): Payer: HMO | Admitting: General Surgery

## 2021-10-05 ENCOUNTER — Encounter (HOSPITAL_BASED_OUTPATIENT_CLINIC_OR_DEPARTMENT_OTHER): Payer: HMO | Attending: General Surgery | Admitting: General Surgery

## 2021-10-05 DIAGNOSIS — E11622 Type 2 diabetes mellitus with other skin ulcer: Secondary | ICD-10-CM | POA: Diagnosis not present

## 2021-10-05 DIAGNOSIS — L97822 Non-pressure chronic ulcer of other part of left lower leg with fat layer exposed: Secondary | ICD-10-CM | POA: Insufficient documentation

## 2021-10-05 DIAGNOSIS — I119 Hypertensive heart disease without heart failure: Secondary | ICD-10-CM | POA: Insufficient documentation

## 2021-10-05 DIAGNOSIS — S80812A Abrasion, left lower leg, initial encounter: Secondary | ICD-10-CM | POA: Diagnosis not present

## 2021-10-05 DIAGNOSIS — I10 Essential (primary) hypertension: Secondary | ICD-10-CM | POA: Diagnosis not present

## 2021-10-05 DIAGNOSIS — L97812 Non-pressure chronic ulcer of other part of right lower leg with fat layer exposed: Secondary | ICD-10-CM | POA: Diagnosis not present

## 2021-10-05 DIAGNOSIS — M48062 Spinal stenosis, lumbar region with neurogenic claudication: Secondary | ICD-10-CM | POA: Insufficient documentation

## 2021-10-05 DIAGNOSIS — S80811A Abrasion, right lower leg, initial encounter: Secondary | ICD-10-CM | POA: Diagnosis not present

## 2021-10-05 NOTE — Progress Notes (Signed)
[name], [name] ([id number]) Visit Report for 10/05/2021 Abuse Risk Screen Details Patient Name: Date of Service: Christina Wong 10/05/2021 8:00 A M Medical Record Number: [id number] Patient Account Number: [id number] Date of Birth/Sex: Treating RN: 23-Apr-1950 (71 [place]. Christina Wong Primary Care Tedric Leeth: [name] Other Clinician: Referring Aryiah Monterosso: Treating Harbor Vanover/Extender: [name] in Treatment: 0 Abuse Risk Screen Items Answer ABUSE RISK SCREEN: Has anyone close to you tried to hurt or harm you recentlyo No Do you feel uncomfortable with anyone in your familyo No Has anyone forced you do things that you didnt want to doo No Electronic Signature(s) Signed: 10/05/2021 5:12:10 PM By: [name] RN Entered By: [name] on 10/05/2021 08:21:19 -------------------------------------------------------------------------------- Activities of Daily Living Details Patient Name: Date of Service: Christina Wong 10/05/2021 8:00 A M Medical Record Number: [id number] Patient Account Number: [id number] Date of Birth/Sex: Treating RN: 1950-11-15 (21 [place]. Christina Wong Primary Care Laverne Hursey: [name] Other Clinician: Referring Mckaela Howley: Treating Balin Vandegrift/Extender: [name] in Treatment: 0 Activities of Daily Living Items Answer Activities of Daily Living (Please select one for each item) Drive Automobile Completely Able T Medications ake Completely Able Use T elephone Completely Able Care for Appearance Completely Able Use T oilet Completely Able Bath / Shower Completely Able Dress Self Completely Able Feed Self Completely Able Walk Completely Able Get In / Out Bed Completely Able Housework Completely Able Prepare Meals Completely Able Handle Money Completely Able Shop for Self Completely Able Electronic Signature(s) Signed: 10/05/2021 5:12:10 PM By: [name] RN Entered By: [name] on 10/05/2021 08:21:54 -------------------------------------------------------------------------------- Education Screening Details Patient Name: Date of Service: Christina Opitz Y. 10/05/2021 8:00 A M Medical Record Number: [id number] Patient Account Number: [id number] Date of Birth/Sex: Treating RN: 1950/12/19 (29 [place]. Christina Wong Primary Care Janthony Holleman: [name] Other Clinician: Referring Christina Wong: Treating Christina Wong/Extender: [name] in Treatment: 0 Primary Learner Assessed: Patient Learning Preferences/Education Level/Primary Language Learning Preference: Explanation, Demonstration, Printed Material Highest Education Level: College or Above Preferred Language: English Cognitive Barrier Language Barrier: No Translator Needed: No Memory Deficit: No Emotional Barrier: No Cultural/Religious Beliefs Affecting Medical Care: No Physical Barrier Impaired Vision: No Impaired Hearing: No Decreased Hand dexterity: No Knowledge/Comprehension Knowledge Level: High Comprehension Level: High Ability to understand written instructions: High Ability to understand verbal instructions: High Motivation Anxiety Level: Calm Cooperation: Cooperative Education Importance: Acknowledges Need Interest in Health Problems: Asks Questions Perception: Coherent Willingness to Engage in Self-Management High Activities: Readiness to Engage in Self-Management High Activities: Electronic Signature(s) Signed: 10/05/2021 5:12:10 PM By: [name] RN Entered By: [name] on 10/05/2021 08:22:43 -------------------------------------------------------------------------------- Fall Risk Assessment Details Patient Name: Date of Service: Christina Opitz Y. 10/05/2021 8:00 A M Medical Record Number: [id number] Patient Account Number: [id number] Date of Birth/Sex: Treating RN: 1950-09-17 (70 [place]. Christina Wong Primary Care Christina Wong: [name] Other Clinician: Referring Christina Wong: Treating Dayson Aboud/Extender: [name] in Treatment: 0 Fall Risk Assessment Items Have you had 2 or more falls in the last 12 monthso 0 No Have you had any fall that resulted in injury in the last 12 monthso 0 No FALLS RISK SCREEN History of falling - immediate or within 3 months 0 No Secondary diagnosis (Do you have 2 or more medical diagnoseso) 0 No Ambulatory aid None/bed rest/wheelchair/nurse 0 No Crutches/cane/walker 0 No Furniture 0 No Intravenous therapy Access/Saline/Heparin Lock 0 No Gait/Transferring Normal/ bed rest/ wheelchair  0 No Weak (short steps with or without shuffle, stooped but able to lift head while walking, may seek 0 No support from furniture) Impaired (short steps with shuffle, may have difficulty arising from chair, head down, impaired 0 No balance) Mental Status Oriented to own ability 0 No Electronic Signature(s) Signed: 10/05/2021 5:12:10 PM By: [name] RN Entered By: [name] on 10/05/2021 08:23:41 -------------------------------------------------------------------------------- Foot Assessment Details Patient Name: Date of Service: Christina Opitz Y. 10/05/2021 8:00 A M Medical Record Number: [id number] Patient Account Number: [id number] Date of Birth/Sex: Treating RN: 1950-07-22 (52 [place]. Christina Wong Primary Care Christina Wong: [name] Other Clinician: Referring Christina Wong: Treating Shakinah Navis/Extender: [name] in Treatment: 0 Foot Assessment Items Site Locations + = Sensation present, - = Sensation absent, C = Callus, U = Ulcer R = Redness, W = Warmth, M = Maceration, PU = Pre-ulcerative lesion F = Fissure, S = Swelling, D = Dryness Assessment Right: Left: Other Deformity: No No Prior Foot Ulcer: No No Prior Amputation: No No Charcot Joint: No No Ambulatory Status: Ambulatory Without Help Gait: Steady Electronic  Signature(s) Signed: 10/05/2021 5:12:10 PM By: [name] RN Entered By: [name] on 10/05/2021 08:26:51 -------------------------------------------------------------------------------- Nutrition Risk Screening Details Patient Name: Date of Service: Christina Wong 10/05/2021 8:00 A M Medical Record Number: [id number] Patient Account Number: [id number] Date of Birth/Sex: Treating RN: 02/14/1950 (81 [place]. Christina Wong Primary Care Megham Dwyer: [name] Other Clinician: Referring Eisley Barber: Treating Kaytlynn Kochan/Extender: [name] in Treatment: 0 Height (in): 63 Weight (lbs): 215 Body Mass Index (BMI): 38.1 Nutrition Risk Screening Items Score Screening NUTRITION RISK SCREEN: I have an illness or condition that made me change the kind and/or amount of food I eat 0 No I eat fewer than two meals per day 0 No I eat few fruits and vegetables, or milk products 0 No I have three or more drinks of beer, liquor or wine almost every day 0 No I have tooth or mouth problems that make it hard for me to eat 0 No I don't always have enough money to buy the food I need 0 No I eat alone most of the time 0 No I take three or more different prescribed or over-the-counter drugs a day 0 No Without wanting to, I have lost or gained 10 pounds in the last six months 0 No I am not always physically able to shop, cook and/or feed myself 0 No Nutrition Protocols Good Risk Protocol 0 No interventions needed Moderate Risk Protocol High Risk Proctocol Risk Level: Good Risk Score: 0 Electronic Signature(s) Signed: 10/05/2021 5:12:10 PM By: [name] RN Entered By: [name] on 10/05/2021 08:23:54

## 2021-10-05 NOTE — Progress Notes (Signed)
Christina Wong, Christina Wong ([id number]) Visit Report for 10/05/2021 Allergy List Details Patient Name: Date of Service: Christina Wong 10/05/2021 8:00 A M Medical Record Number: [id number] Patient Account Number: [id number] Date of Birth/Sex: Treating RN: 12-Jun-1950 (71 [place]. [name] Primary Care Charmon Thorson: [name] Other Clinician: Referring Rasheena Talmadge: Treating Chaselynn Kepple/Extender: [name] in Treatment: 0 Allergies Active Allergies atorvastatin Severity: Severe onion Severity: Severe Bactrim Severity: Severe Allergy Notes Electronic Signature(s) Signed: 10/05/2021 5:12:10 PM By: [name] RN Entered By: [name] on 10/05/2021 08:14:27 -------------------------------------------------------------------------------- Arrival Information Details Patient Name: Date of Service: Christina Opitz Y. 10/05/2021 8:00 A M Medical Record Number: [id number] Patient Account Number: [id number] Date of Birth/Sex: Treating RN: February 28, 1950 (62 [place]. [name] Primary Care Fatou Dunnigan: [name] Other Clinician: Referring Secily Walthour: Treating Liisa Picone/Extender: [name] in Treatment: 0 Visit Information Patient Arrived: Ambulatory Arrival Time: 08:01 Accompanied By: self Transfer Assistance: None Patient Identification Verified: Yes Electronic Signature(s) Signed: 10/05/2021 5:12:10 PM By: [name] RN Entered By: [name] on 10/05/2021 08:12:45 -------------------------------------------------------------------------------- Clinic Level of Care Assessment Details Patient Name: Date of Service: Christina Wong 10/05/2021 8:00 A M Medical Record Number: [id number] Patient Account Number: [id number] Date of Birth/Sex: Treating RN: 08-31-1950 (61 [place]. [name] Primary Care Dayten Juba: [name] Other Clinician: Referring Fabion Gatson: Treating Kemiya Batdorf/Extender: [name] in Treatment: 0 Clinic Level of Care Assessment Items TOOL 1 Quantity Score X- 1 0 Use when EandM and Procedure is performed on INITIAL visit ASSESSMENTS - Nursing Assessment / Reassessment X- 1 20 General Physical Exam (combine w/ comprehensive assessment (listed just below) when performed on new pt. evals) X- 1 25 Comprehensive Assessment (HX, ROS, Risk Assessments, Wounds Hx, etc.) ASSESSMENTS - Wound and Skin Assessment / Reassessment []  - 0 Dermatologic / Skin Assessment (not related to wound area) ASSESSMENTS - Ostomy and/or Continence Assessment and Care X- 1 10 Incontinence Assessment and Management X- 1 20 Ostomy Care Assessment and Management (repouching, etc.) PROCESS - Coordination of Care X - Simple Patient / Family Education for ongoing care 1 15 []  - 0 Complex (extensive) Patient / Family Education for ongoing care X- 1 10 Staff obtains , Records, T Results / Process Orders est X- 1 10 Staff telephones HHA, Nursing Homes / Clarify orders / etc []  - 0 Routine Transfer to another Facility (non-emergent condition) []  - 0 Routine Hospital Admission (non-emergent condition) X- 1 15 New Admissions / / Ordering NPWT Apligraf, etc. , []  - 0 Emergency Hospital Admission (emergent condition) PROCESS - Special Needs []  - 0 Pediatric / Minor Patient Management []  - 0 Isolation Patient Management []  - 0 Hearing / Language / Visual special needs []  - 0 Assessment of Community assistance (transportation, D/C planning, etc.) []  - 0 Additional assistance / Altered mentation []  - 0 Support Surface(s) Assessment (bed, cushion, seat, etc.) INTERVENTIONS - Miscellaneous []  - 0 External ear exam []  - 0 Patient Transfer (multiple staff / Chiropractor / Similar devices) []  - 0 Simple Staple / Suture removal (25 or less) []  - 0 Complex Staple / Suture removal (26 or more) []  - 0 Hypo/Hyperglycemic Management (do not check  if billed separately) X- 1 15 Ankle / Brachial Index (ABI) - do not check if billed separately Has the patient been seen at the hospital within the last three years: Yes Total Score: 140 Level Of Care: New/Established - Level 4 Electronic Signature(s) Signed:  10/05/2021 5:12:10 PM By: [name] RN Signed: 10/05/2021 5:12:10 PM By: [name] RN Entered By: [name] on 10/05/2021 17:08:11 -------------------------------------------------------------------------------- Compression Therapy Details Patient Name: Date of Service: Christina Wong. 10/05/2021 8:00 A M Medical Record Number: [id number] Patient Account Number: [id number] Date of Birth/Sex: Treating RN: Nov 08, 1950 (71 [place]. [name] Primary Care Thales Knipple: [name] Other Clinician: Referring Sreekar Broyhill: Treating Deena Shaub/Extender: [name] in Treatment: 0 Compression Therapy Performed for Wound Assessment: Wound #2 Right,Distal,Anterior Lower Leg Performed By: Clinician [name], RN Compression Type: Three Layer Post Procedure Diagnosis Same as Pre-procedure Electronic Signature(s) Signed: 10/05/2021 5:12:10 PM By: [name] RN Entered By: [name] on 10/05/2021 09:11:39 -------------------------------------------------------------------------------- Compression Therapy Details Patient Name: Date of Service: Christina Wong. 10/05/2021 8:00 A M Medical Record Number: [id number] Patient Account Number: [id number] Date of Birth/Sex: Treating RN: 1950-05-18 (44 [place]. [name] Primary Care Keyasha Miah: [name] Other Clinician: Referring Cozetta Seif: Treating Hadiya Spoerl/Extender: [name] in Treatment: 0 Compression Therapy Performed for Wound Assessment: Wound #3 Left,Anterior Lower Leg Performed By: Clinician [name], RN Compression Type: Three Layer Post Procedure Diagnosis Same as  Pre-procedure Electronic Signature(s) Signed: 10/05/2021 5:12:10 PM By: [name] RN Entered By: [name] on 10/05/2021 09:11:40 -------------------------------------------------------------------------------- Encounter Discharge Information Details Patient Name: Date of Service: Christina Wong. 10/05/2021 8:00 A M Medical Record Number: [id number] Patient Account Number: [id number] Date of Birth/Sex: Treating RN: 31-Aug-1950 (63 [place]. [name] Primary Care Zyir Gassert: [name] Other Clinician: Referring Lyssa Hackley: Treating Peyten Punches/Extender: [name] in Treatment: 0 Encounter Discharge Information Items Post Procedure Vitals Discharge Condition: Stable Temperature (F): 98.3 Ambulatory Status: Ambulatory Pulse (bpm): 74 Discharge Destination: Home Respiratory Rate (breaths/min): 16 Transportation: Private Auto Blood Pressure (mmHg): 130/62 Accompanied By: self Schedule Follow-up Appointment: Yes Clinical Summary of Care: Patient Declined Electronic Signature(s) Signed: 10/05/2021 5:12:10 PM By: [name] RN Entered By: [name] on 10/05/2021 17:11:37 -------------------------------------------------------------------------------- Lower Extremity Assessment Details Patient Name: Date of Service: Christina Wong 10/05/2021 8:00 A M Medical Record Number: [id number] Patient Account Number: [id number] Date of Birth/Sex: Treating RN: 1950/04/12 (35 [place]. [name] Primary Care Presly Steinruck: [name] Other Clinician: Referring Shervin Cypert: Treating Shaquitta Burbridge/Extender: [name] in Treatment: 0 Edema Assessment Assessed: Kyra Searles: No] [Right: No] E[Left: dema] [Right: :] Calf Left: Right: Point of Measurement: 30 cm From Medial Instep 38.5 cm 38 cm Ankle Left: Right: Point of Measurement: 8 cm From Medial Instep 22.8 cm 21 cm Knee To Floor Left: Right: From Medial Instep 39  cm 39 cm Vascular Assessment Pulses: Dorsalis Pedis Palpable: [Left:Yes] [Right:Yes] Blood Pressure: Brachial: [Left:130] [Right:130] Ankle: [Left:Dorsalis Pedis: 148 1.14] [Right:Dorsalis Pedis: 150 1.15] Electronic Signature(s) Signed: 10/05/2021 5:12:10 PM By: [name] RN Entered By: [name] on 10/05/2021 08:41:57 -------------------------------------------------------------------------------- Multi Wound Chart Details Patient Name: Date of Service: Christina Opitz Y. 10/05/2021 8:00 A M Medical Record Number: [id number] Patient Account Number: [id number] Date of Birth/Sex: Treating RN: 03-05-1950 (73 [place]. [name] Primary Care Marquette Blodgett: [name] Other Clinician: Referring Hasna Stefanik: Treating Gleb Mcguire/Extender: [name] in Treatment: 0 Vital Signs Height(in): 63 Pulse(bpm): 74 Weight(lbs): 215 Blood Pressure(mmHg): 130/62 Body Mass Index(BMI): 38.1 Temperature(F): 98.3 Respiratory Rate(breaths/min): 16 Photos: Right, Proximal, Anterior Lower LegRight, Distal, Anterior Lower Leg Left, Anterior Lower Leg Wound Location: Trauma Trauma Trauma Wounding Event: Abrasion Abrasion Abrasion Primary Etiology: Hypertension Hypertension Hypertension Comorbid  History: 07/15/2021 07/15/2021 07/15/2021 Date Acquired: 0 0 0 Weeks of Treatment: Healed - Epithelialized Open Open Wound Status: No No No Wound Recurrence: 0x0x0 0.7x0.8x0.1 1.2x1.5x0.1 Measurements L x W x D (cm) 0 0.44 1.414 A (cm) : rea 0 0.044 0.141 Volume (cm) : 100.00% 0.00% 0.00% % Reduction in A rea: 100.00% 0.00% 0.00% % Reduction in Volume: Partial Thickness Full Thickness Without Exposed Full Thickness Without Exposed Classification: Support Structures Support Structures None Present Medium Medium Exudate A mount: N/A Serosanguineous Serous Exudate Type: N/A red, brown amber Exudate Color: None Present (0%) Small (1-33%) Small  (1-33%) Granulation A mount: N/A Pink Pink Granulation Quality: None Present (0%) Large (67-100%) Large (67-100%) Necrotic A mount: N/A Eschar, Adherent Slough Eschar, Adherent Slough Necrotic Tissue: Fascia: No Fat Layer (Subcutaneous Tissue): Yes Fat Layer (Subcutaneous Tissue): Yes Exposed Structures: Fat Layer (Subcutaneous Tissue): No Fascia: No Fascia: No Tendon: No Tendon: No Tendon: No Muscle: No Muscle: No Muscle: No Joint: No Joint: No Joint: No Bone: No Bone: No Bone: No Large (67-100%) Small (1-33%) Small (1-33%) Epithelialization: N/A Debridement - Selective/Open Wound Debridement - Excisional Debridement: Pre-procedure Verification/Time Out N/A 09:04 09:04 Taken: N/A Lidocaine 4% Topical Solution Lidocaine 4% Topical Solution Pain Control: N/A Ambulance person, Subcutaneous, Tissue Debrided: Slough N/A Non-Viable Tissue Skin/Subcutaneous Tissue Level: N/A 0.56 1.8 Debridement A (sq cm): rea N/A Curette Curette Instrument: N/A Minimum Minimum Bleeding: N/A Pressure Pressure Hemostasis Achieved: N/A 0 0 Procedural Pain: N/A 0 0 Post Procedural Pain: N/A Procedure was tolerated well Procedure was tolerated well Debridement Treatment Response: N/A 0.7x0.8x0.1 1.2x1.5x0.1 Post Debridement Measurements L x W x D (cm) N/A 0.044 0.141 Post Debridement Volume: (cm) N/A Compression Therapy Compression Therapy Procedures Performed: Debridement Debridement Treatment Notes Electronic Signature(s) Signed: 10/05/2021 9:28:38 AM By: [name] MD FACS Signed: 10/05/2021 5:12:10 PM By: [name] RN Entered By: [name] on 10/05/2021 09:28:38 -------------------------------------------------------------------------------- Multi-Disciplinary Care Plan Details Patient Name: Date of Service: Christina Opitz Y. 10/05/2021 8:00 A M Medical Record Number: [id number] Patient Account Number: [id number] Date of Birth/Sex: Treating  RN: 01-Sep-1950 (39 [place]. [name] Primary Care Tonya Wantz: [name] Other Clinician: Referring Devaney Segers: Treating Delanda Bulluck/Extender: [name] in Treatment: 0 Active Inactive Wound/Skin Impairment Nursing Diagnoses: Knowledge deficit related to ulceration/compromised skin integrity Goals: Patient/caregiver will verbalize understanding of skin care regimen Date Initiated: 10/05/2021 Target Resolution Date: 12/05/2021 Goal Status: Active Interventions: Provide education on ulcer and skin care Treatment Activities: Skin care regimen initiated : 10/05/2021 Notes: Electronic Signature(s) Signed: 10/05/2021 5:12:10 PM By: [name] RN Entered By: [name] on 10/05/2021 17:10:02 -------------------------------------------------------------------------------- Pain Assessment Details Patient Name: Date of Service: Christina Opitz Y. 10/05/2021 8:00 A M Medical Record Number: [id number] Patient Account Number: [id number] Date of Birth/Sex: Treating RN: 1951-01-08 (21 [place]. [name] Primary Care Kalan Yeley: [name] Other Clinician: Referring Zymarion Favorite: Treating Emanuella Nickle/Extender: [name] in Treatment: 0 Active Problems Location of Pain Severity and Description of Pain Patient Has Paino Yes Site Locations Pain Location: Pain Location: Pain in Ulcers With Dressing Change: Yes Duration of the Pain. Constant / Intermittento Constant Rate the pain. Current Pain Level: 3 Worst Pain Level: 10 Least Pain Level: 3 Tolerable Pain Level: 5 Character of Pain Describe the Pain: Difficult to Pinpoint Pain Management and Medication Current Pain Management: Medication: No Cold Application: No Rest: Yes Massage: No Activity: No T.E.N.S.: No Heat Application: No Leg drop or elevation: No Is the Current Pain Management  Adequate: Adequate How does your wound impact your activities of daily  livingo Sleep: No Bathing: No Appetite: No Relationship With Others: No Bladder Continence: No Emotions: No Bowel Continence: No Work: No Toileting: No Drive: No Dressing: No Hobbies: No Electronic Signature(s) Signed: 10/05/2021 5:12:10 PM By: Karie SchwalbeScotton, Joanne RN Entered By: Karie SchwalbeScotton, Joanne on 10/05/2021 09:01:17 -------------------------------------------------------------------------------- Patient/Caregiver Education Details Patient Name: Date of Service: Christina Wong, Christina N Y. 8/31/2023andnbsp8:00 A M Medical Record Number: [id number] Patient Account Number: [id number] Date of Birth/Gender: Treating RN: 06/08/1950 (71 [place]. Katrinka BlazingF) Scotton, Joanne Primary Care Physician: Elias Elseeade, Robert Other Clinician: Referring Physician: Treating Physician/Extender: Berkley Harveyannon, Jennifer Reade, Robert Weeks in Treatment: 0 Education Assessment Education Provided To: Patient Education Topics Provided Wound/Skin Impairment: Methods: Explain/Verbal Responses: Return demonstration correctly Electronic Signature(s) Signed: 10/05/2021 5:12:10 PM By: Karie SchwalbeScotton, Joanne RN Entered By: Karie SchwalbeScotton, Joanne on 10/05/2021 17:10:12 -------------------------------------------------------------------------------- Wound Assessment Details Patient Name: Date of Service: Christina Wong, Christina N Y. 10/05/2021 8:00 A M Medical Record Number: [id number] Patient Account Number: [id number] Date of Birth/Sex: Treating RN: 04/20/1950 (71 [place]. Katrinka BlazingF) Scotton, Joanne Primary Care Monet North: Elias Elseeade, Robert Other Clinician: Referring Jacobie Stamey: Treating Normon Pettijohn/Extender: Berkley Harveyannon, Jennifer Reade, Robert Weeks in Treatment: 0 Wound Status Wound Number: 1 Primary Etiology: Abrasion Wound Location: Right, Proximal, Anterior Lower Leg Wound Status: Healed - Epithelialized Wounding Event: Trauma Comorbid History: Hypertension Date Acquired: 07/15/2021 Weeks Of Treatment: 0 Clustered Wound: No Photos Wound Measurements Length: (cm) Width:  (cm) Depth: (cm) Area: (cm) Volume: (cm) 0 % Reduction in Area: 100% 0 % Reduction in Volume: 100% 0 Epithelialization: Large (67-100%) 0 Tunneling: No 0 Undermining: No Wound Description Classification: Partial Thickness Exudate Amount: None Present Foul Odor After Cleansing: No Slough/Fibrino No Wound Bed Granulation Amount: None Present (0%) Exposed Structure Necrotic Amount: None Present (0%) Fascia Exposed: No Fat Layer (Subcutaneous Tissue) Exposed: No Tendon Exposed: No Muscle Exposed: No Joint Exposed: No Bone Exposed: No Electronic Signature(s) Signed: 10/05/2021 5:12:10 PM By: Karie SchwalbeScotton, Joanne RN Entered By: Karie SchwalbeScotton, Joanne on 10/05/2021 09:08:34 -------------------------------------------------------------------------------- Wound Assessment Details Patient Name: Date of Service: Christina Wong, Christina N Y. 10/05/2021 8:00 A M Medical Record Number: [id number] Patient Account Number: [id number] Date of Birth/Sex: Treating RN: 01/09/1951 (71 [place]. Katrinka BlazingF) Scotton, Joanne Primary Care Jenasis Straley: Elias Elseeade, Robert Other Clinician: Referring Gerome Kokesh: Treating Blayze Haen/Extender: Berkley Harveyannon, Jennifer Reade, Robert Weeks in Treatment: 0 Wound Status Wound Number: 2 Primary Etiology: Abrasion Wound Location: Right, Distal, Anterior Lower Leg Wound Status: Open Wounding Event: Trauma Comorbid History: Hypertension Date Acquired: 07/15/2021 Weeks Of Treatment: 0 Clustered Wound: No Photos Wound Measurements Length: (cm) 0.7 Width: (cm) 0.8 Depth: (cm) 0.1 Area: (cm) 0.44 Volume: (cm) 0.044 % Reduction in Area: 0% % Reduction in Volume: 0% Epithelialization: Small (1-33%) Tunneling: No Undermining: No Wound Description Classification: Full Thickness Without Exposed Support Structures Exudate Amount: Medium Exudate Type: Serosanguineous Exudate Color: red, brown Foul Odor After Cleansing: No Slough/Fibrino Yes Wound Bed Granulation Amount: Small (1-33%) Exposed  Structure Granulation Quality: Pink Fascia Exposed: No Necrotic Amount: Large (67-100%) Fat Layer (Subcutaneous Tissue) Exposed: Yes Necrotic Quality: Eschar, Adherent Slough Tendon Exposed: No Muscle Exposed: No Joint Exposed: No Bone Exposed: No Treatment Notes Wound #2 (Lower Leg) Wound Laterality: Right, Anterior, Distal Cleanser Soap and Water Discharge Instruction: May shower and wash wound with dial antibacterial soap and water prior to dressing change. Wound Cleanser Discharge Instruction: Cleanse the wound with wound cleanser prior to applying a clean dressing using gauze sponges, not tissue or cotton balls. Peri-Wound Care Sween Lotion (Moisturizing lotion) Discharge Instruction:  Apply moisturizing lotion as directed Topical Primary Dressing KerraCel Ag Gelling Fiber Dressing, 2x2 in (silver alginate) Discharge Instruction: Apply silver alginate to wound bed as instructed Secondary Dressing Secured With Transpore Surgical Tape, 2x10 (in/yd) Discharge Instruction: Secure dressing with tape as directed. Compression Wrap ThreePress (3 layer compression wrap) Discharge Instruction: Apply three layer compression as directed. Tubular Netting #5 Compression Stockings Add-Ons Electronic Signature(s) Signed: 10/05/2021 5:12:10 PM By: [name] RN Entered By: [name] on 10/05/2021 08:54:43 -------------------------------------------------------------------------------- Wound Assessment Details Patient Name: Date of Service: Christina Wong. 10/05/2021 8:00 A M Medical Record Number: [id number] Patient Account Number: [id number] Date of Birth/Sex: Treating RN: [birth year] (4 [place]. [name] Primary Care Amir Glaus: [name] Other Clinician: Referring Aysia Lowder: Treating Ashantia Amaral/Extender: [name] in Treatment: 0 Wound Status Wound Number: 3 Primary Etiology: Abrasion Wound Location: Left, Anterior Lower  Leg Wound Status: Open Wounding Event: Trauma Comorbid History: Hypertension Date Acquired: 07/15/2021 Weeks Of Treatment: 0 Clustered Wound: No Photos Wound Measurements Length: (cm) 1.2 Width: (cm) 1.5 Depth: (cm) 0.1 Area: (cm) 1.414 Volume: (cm) 0.141 % Reduction in Area: 0% % Reduction in Volume: 0% Epithelialization: Small (1-33%) Tunneling: No Undermining: No Wound Description Classification: Full Thickness Without Exposed Support Structures Exudate Amount: Medium Exudate Type: Serous Exudate Color: amber Foul Odor After Cleansing: No Slough/Fibrino Yes Wound Bed Granulation Amount: Small (1-33%) Exposed Structure Granulation Quality: Pink Fascia Exposed: No Necrotic Amount: Large (67-100%) Fat Layer (Subcutaneous Tissue) Exposed: Yes Necrotic Quality: Eschar, Adherent Slough Tendon Exposed: No Muscle Exposed: No Joint Exposed: No Bone Exposed: No Treatment Notes Wound #3 (Lower Leg) Wound Laterality: Left, Anterior Cleanser Soap and Water Discharge Instruction: May shower and wash wound with dial antibacterial soap and water prior to dressing change. Wound Cleanser Discharge Instruction: Cleanse the wound with wound cleanser prior to applying a clean dressing using gauze sponges, not tissue or cotton balls. Peri-Wound Care Sween Lotion (Moisturizing lotion) Discharge Instruction: Apply moisturizing lotion as directed Topical Primary Dressing KerraCel Ag Gelling Fiber Dressing, 2x2 in (silver alginate) Discharge Instruction: Apply silver alginate to wound bed as instructed Secondary Dressing Secured With Transpore Surgical Tape, 2x10 (in/yd) Discharge Instruction: Secure dressing with tape as directed. Compression Wrap ThreePress (3 layer compression wrap) Discharge Instruction: Apply three layer compression as directed. Tubular Netting #5 Compression Stockings Add-Ons Electronic Signature(s) Signed: 10/05/2021 5:12:10 PM By: [name]  RN Entered By: [name] on 10/05/2021 08:55:12 -------------------------------------------------------------------------------- Vitals Details Patient Name: Date of Service: Christina Opitz Y. 10/05/2021 8:00 A M Medical Record Number: [id number] Patient Account Number: [id number] Date of Birth/Sex: Treating RN: 1950-02-15 (46 [place]. [name] Primary Care Mehtaab Mayeda: [name] Other Clinician: Referring Chaseton Yepiz: Treating Holdan Stucke/Extender: [name] in Treatment: 0 Vital Signs Time Taken: 08:05 Temperature (F): 98.3 Height (in): 63 Pulse (bpm): 74 Source: Stated Respiratory Rate (breaths/min): 16 Weight (lbs): 215 Blood Pressure (mmHg): 130/62 Source: Stated Reference Range: 80 - 120 mg / dl Body Mass Index (BMI): 38.1 Electronic Signature(s) Signed: 10/05/2021 5:12:10 PM By: [name] RN Entered By: [name] on 10/05/2021 08:13:35

## 2021-10-06 NOTE — Progress Notes (Signed)
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 Married; Alcohol Use: Moderate; Drug Use: No History; Caffeine Use: Daily; Financial Concerns: No; Food, Clothing or Shelter Needs: No; Support System Lacking: No; Transportation Concerns: No Psychologist, prison and probation services) Signed: 10/05/2021 9:52:53 AM By: [name] MD  FACS Signed: 10/05/2021 5:12:10 PM By: [name] RN Entered By: [name] on 10/05/2021 08:21:12 -------------------------------------------------------------------------------- SuperBill Details Patient Name: Date of Service: Christina Wong 10/05/2021 Medical Record Number: [id number] Patient Account Number: [id number] Date of Birth/Sex: Treating RN: 1950/03/20 (54 [place]. 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 with fat layer exposed Facility Procedures CPT4 Code: [id number] Description: 99214 - WOUND CARE VISIT-LEV 4 EST PT Modifier: 25 Quantity: 1 CPT4 Code: [id number] Description: 11042 - DEB SUBQ TISSUE 20 SQ CM/< ICD-10 Diagnosis Description L97.822 Non-pressure chronic ulcer of other part of left lower leg with fat layer expose Modifier: d Quantity: 1 CPT4 Code: [id number] Description: 97597 - DEBRIDE WOUND 1ST 20 SQ CM OR < ICD-10 Diagnosis Description L97.812 Non-pressure chronic ulcer of other part of right lower leg with fat layer expos Modifier: ed Quantity: 1 Physician Procedures : CPT4 Code Description Modifier [id number] 99204 - WC PHYS LEVEL 4 - NEW PT 25 ICD-10 Diagnosis Description L97.822 Non-pressure chronic ulcer of other part of left lower leg with fat layer exposed L97.812 Non-pressure chronic ulcer of other part of right  lower leg with fat layer exposed Quantity: 1 : [id number] 11042 - WC PHYS SUBQ TISS 20 SQ CM 1 ICD-10 Diagnosis Description L97.822 Non-pressure chronic ulcer of other part of left lower leg with fat layer exposed Quantity: : [id number] 97597 - WC PHYS DEBR WO ANESTH 20 SQ CM 1 ICD-10 Diagnosis Description L97.812 Non-pressure chronic ulcer of other part of right lower leg with fat layer exposed Quantity: Electronic Signature(s) Signed: 10/05/2021 5:12:10 PM By: [name] RN Signed: 10/06/2021 7:32:40 AM By: [name] MD FACS Previous Signature:  10/05/2021 9:35:10 AM Version By: [name] MD FACS Entered By: [name] on 10/05/2021 17:08:23

## 2021-10-10 DIAGNOSIS — E1169 Type 2 diabetes mellitus with other specified complication: Secondary | ICD-10-CM | POA: Diagnosis not present

## 2021-10-10 DIAGNOSIS — E669 Obesity, unspecified: Secondary | ICD-10-CM | POA: Diagnosis not present

## 2021-10-11 DIAGNOSIS — H401131 Primary open-angle glaucoma, bilateral, mild stage: Secondary | ICD-10-CM | POA: Diagnosis not present

## 2021-10-13 ENCOUNTER — Encounter (HOSPITAL_BASED_OUTPATIENT_CLINIC_OR_DEPARTMENT_OTHER): Payer: HMO | Attending: General Surgery | Admitting: General Surgery

## 2021-10-13 DIAGNOSIS — L97812 Non-pressure chronic ulcer of other part of right lower leg with fat layer exposed: Secondary | ICD-10-CM | POA: Diagnosis not present

## 2021-10-13 DIAGNOSIS — S81811A Laceration without foreign body, right lower leg, initial encounter: Secondary | ICD-10-CM | POA: Diagnosis not present

## 2021-10-13 DIAGNOSIS — E11622 Type 2 diabetes mellitus with other skin ulcer: Secondary | ICD-10-CM | POA: Diagnosis not present

## 2021-10-13 DIAGNOSIS — L97822 Non-pressure chronic ulcer of other part of left lower leg with fat layer exposed: Secondary | ICD-10-CM | POA: Insufficient documentation

## 2021-10-13 DIAGNOSIS — W228XXA Striking against or struck by other objects, initial encounter: Secondary | ICD-10-CM | POA: Diagnosis not present

## 2021-10-13 DIAGNOSIS — S81802A Unspecified open wound, left lower leg, initial encounter: Secondary | ICD-10-CM | POA: Diagnosis not present

## 2021-10-13 DIAGNOSIS — M48062 Spinal stenosis, lumbar region with neurogenic claudication: Secondary | ICD-10-CM | POA: Diagnosis not present

## 2021-10-13 DIAGNOSIS — S81812A Laceration without foreign body, left lower leg, initial encounter: Secondary | ICD-10-CM | POA: Diagnosis not present

## 2021-10-13 DIAGNOSIS — I119 Hypertensive heart disease without heart failure: Secondary | ICD-10-CM | POA: Insufficient documentation
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
## 2021-10-13 NOTE — Progress Notes (Signed)
Christina Wong, Christina Wong ([id number]) Visit Report for 10/13/2021 Arrival Information Details Patient Name: Date of Service: Christina Wong 10/13/2021 2:30 PM Medical Record Number: [id number] Patient Account Number: [id number] Date of Birth/Sex: Treating RN: 09-30-1950 (71 [place]. [name] Primary Care Ashelynn Marks: [name] Other Clinician: Referring Donatello Kleve: Treating Yasuko Lapage/Extender: [name] in Treatment: 1 Visit Information History Since Last Visit Added or deleted any medications: No Patient Arrived: Ambulatory Any new allergies or adverse reactions: No Arrival Time: 14:58 Had a fall or experienced change in No Accompanied By: self activities of daily living that may affect Transfer Assistance: None risk of falls: Patient Identification Verified: Yes Signs or symptoms of abuse/neglect since last visito No Hospitalized since last visit: No Implantable device outside of the clinic excluding No cellular tissue based products placed in the center since last visit: Pain Present Now: No Electronic Signature(s) Signed: 10/13/2021 5:34:33 PM By: [name] RN Entered By: [name] on 10/13/2021 14:59:48 -------------------------------------------------------------------------------- Encounter Discharge Information Details Patient Name: Date of Service: Christina Wong. 10/13/2021 2:30 PM Medical Record Number: [id number] Patient Account Number: [id number] Date of Birth/Sex: Treating RN: 10-May-1950 (63 [place]. [name] Primary Care Stella Encarnacion: [name] Other Clinician: Referring Crimson Beer: Treating Shalayah Beagley/Extender: [name] in Treatment: 1 Encounter Discharge Information Items Post Procedure Vitals Discharge Condition: Stable Temperature (F): 97.6 Ambulatory Status: Ambulatory Pulse (bpm): 69 Discharge Destination: Home Respiratory Rate (breaths/min): 16 Transportation: Private Auto Blood  Pressure (mmHg): 120/62 Accompanied By: self Schedule Follow-up Appointment: Yes Clinical Summary of Care: Patient Declined Electronic Signature(s) Signed: 10/13/2021 5:34:33 PM By: [name] RN Entered By: [name] on 10/13/2021 17:33:59 -------------------------------------------------------------------------------- Lower Extremity Assessment Details Patient Name: Date of Service: Christina Wong 10/13/2021 2:30 PM Medical Record Number: [id number] Patient Account Number: [id number] Date of Birth/Sex: Treating RN: [birth year] (75 [place]. [name] Primary Care Natiya Seelinger: [name] Other Clinician: Referring Jermiah Howton: Treating Demisha Nokes/Extender: [name] in Treatment: 1 Edema Assessment Assessed: [Left: No] [Right: No] E[Left: dema] [Right: :] Calf Left: Right: Point of Measurement: 30 cm From Medial Instep 38.5 cm 38 cm Ankle Left: Right: Point of Measurement: 8 cm From Medial Instep 22.8 cm 21 cm Electronic Signature(s) Signed: 10/13/2021 5:34:33 PM By: [name] RN Entered By: [name] on 10/13/2021 15:02:30 -------------------------------------------------------------------------------- Multi Wound Chart Details Patient Name: Date of Service: Christina Wong. 10/13/2021 2:30 PM Medical Record Number: [id number] Patient Account Number: [id number] Date of Birth/Sex: Treating RN: 03/08/1950 (89 [place]. [name] Primary Care Seth Higginbotham: [name] Other Clinician: Referring Mischa Pollard: Treating Neeraj Housand/Extender: [name] in Treatment: 1 Vital Signs Height(in): 63 Pulse(bpm): 69 Weight(lbs): 215 Blood Pressure(mmHg): 120/62 Body Mass Index(BMI): 38.1 Temperature(F): 97.6 Respiratory Rate(breaths/min): 16 Photos: [2:No Photos Right, Distal, Anterior Lower Leg] [3:No Photos Left, Anterior Lower Leg] [N/A:N/A N/A] Wound Location: [2:Trauma] [3:Trauma] [N/A:N/A] Wounding  Event: [2:Abrasion] [3:Abrasion] [N/A:N/A] Primary Etiology: [2:Hypertension] [3:Hypertension] [N/A:N/A] Comorbid History: [2:07/15/2021] [3:07/15/2021] [N/A:N/A] Date Acquired: [2:1] [3:1] [N/A:N/A] Weeks of Treatment: [2:Healed - Epithelialized] [3:Open] [N/A:N/A] Wound Status: [2:No] [3:No] [N/A:N/A] Wound Recurrence: [2:0x0x0] [3:1.2x1x0.1] [N/A:N/A] Measurements L x W x D (cm) [2:0] [3:0.942] [N/A:N/A] A (cm) : rea [2:0] [3:0.094] [N/A:N/A] Volume (cm) : [2:100.00%] [3:33.40%] [N/A:N/A] % Reduction in A rea: [2:100.00%] [3:33.30%] [N/A:N/A] % Reduction in Volume: [2:Full Thickness Without Exposed] [3:Full Thickness Without Exposed] [N/A:N/A] Classification: [2:Support Structures None Present] [3:Support Structures Medium] [N/A:N/A] Exudate A mount: [2:N/A] [3:Serous] [N/A:N/A] Exudate Type: [  2:N/A] [3:amber] [N/A:N/A] Exudate Color: [2:None Present (0%)] [3:Small (1-33%)] [N/A:N/A] Granulation Amount: [2:N/A] [3:Pink] [N/A:N/A] Granulation Quality: [2:None Present (0%)] [3:Large (67-100%)] [N/A:N/A] Necrotic Amount: [2:N/A] [3:Eschar, Adherent Slough] [N/A:N/A] Necrotic Tissue: [2:Fascia: No] [3:Fat Layer (Subcutaneous Tissue): Yes N/A] Exposed Structures: [2:Fat Layer (Subcutaneous Tissue): No Tendon: No Muscle: No Joint: No Bone: No Large (67-100%)] [3:Fascia: No Tendon: No Muscle: No Joint: No Bone: No Small (1-33%)] [N/A:N/A] Epithelialization: [2:Debridement - Selective/Open Wound] [3:Debridement - Selective/Open Wound N/A] Debridement: Pre-procedure Verification/Time Out 15:10 [3:15:10] [N/A:N/A] Taken: [2:Lidocaine 5% topical ointment] [3:Lidocaine 5% topical ointment] [N/A:N/A] Pain Control: [2:Necrotic/Eschar] [3:Necrotic/Eschar, Slough] [N/A:N/A] Tissue Debrided: [2:Non-Viable Tissue] [3:Non-Viable Tissue] [N/A:N/A] Level: [2:0.04] [3:1.2] [N/A:N/A] Debridement A (sq cm): [2:rea Curette] [3:Curette] [N/A:N/A] Instrument: [2:Minimum] [3:Minimum] [N/A:N/A] Bleeding:  [2:Pressure] [3:Pressure] [N/A:N/A] Hemostasis A chieved: [2:0] [3:0] [N/A:N/A] Procedural Pain: [2:0] [3:0] [N/A:N/A] Post Procedural Pain: [2:Procedure was tolerated well] [3:Procedure was tolerated well] [N/A:N/A] Debridement Treatment Response: [2:0.2x0.2x0.1] [3:1.2x1x0.1] [N/A:N/A] Post Debridement Measurements L x W x D (cm) [2:0.003] [3:0.094] [N/A:N/A] Post Debridement Volume: (cm) [2:Debridement] [3:Debridement] [N/A:N/A] Treatment Notes Electronic Signature(s) Signed: 10/13/2021 3:23:04 PM By: [name] MD FACS Signed: 10/13/2021 5:34:33 PM By: [name] RN Entered By: [name] on 10/13/2021 15:23:04 -------------------------------------------------------------------------------- Multi-Disciplinary Care Plan Details Patient Name: Date of Service: Christina Wong. 10/13/2021 2:30 PM Medical Record Number: [id number] Patient Account Number: [id number] Date of Birth/Sex: Treating RN: 02/12/1950 (11 [place]. [name] Primary Care Kashtyn Jankowski: [name] Other Clinician: Referring Oaklen Thiam: Treating Dianely Krehbiel/Extender: [name] in Treatment: 1 Active Inactive Wound/Skin Impairment Nursing Diagnoses: Knowledge deficit related to ulceration/compromised skin integrity Goals: Patient/caregiver will verbalize understanding of skin care regimen Date Initiated: 10/05/2021 Target Resolution Date: 12/05/2021 Goal Status: Active Interventions: Provide education on ulcer and skin care Treatment Activities: Skin care regimen initiated : 10/05/2021 Notes: Electronic Signature(s) Signed: 10/13/2021 5:34:33 PM By: [name] RN Entered By: [name] on 10/13/2021 15:14:34 -------------------------------------------------------------------------------- Pain Assessment Details Patient Name: Date of Service: Christina Wong. 10/13/2021 2:30 PM Medical Record Number: [id number] Patient Account Number: [id number] Date of  Birth/Sex: Treating RN: 03/12/1950 (15 [place]. [name] Primary Care Quida Glasser: [name] Other Clinician: Referring Araya Roel: Treating Genifer Lazenby/Extender: [name] in Treatment: 1 Active Problems Location of Pain Severity and Description of Pain Patient Has Paino No Site Locations Pain Management and Medication Current Pain Management: Electronic Signature(s) Signed: 10/13/2021 5:34:33 PM By: [name] RN Entered By: [name] on 10/13/2021 15:02:19 -------------------------------------------------------------------------------- Patient/Caregiver Education Details Patient Name: Date of Service: Christina Wong 9/8/2023andnbsp2:30 PM Medical Record Number: [id number] Patient Account Number: [id number] Date of Birth/Gender: Treating RN: [birth year] (46 [place]. [name] Primary Care Physician: [name] Other Clinician: Referring Physician: Treating Physician/Extender: [name] in Treatment: 1 Education Assessment Education Provided To: Patient Education Topics Provided Wound/Skin Impairment: Methods: Explain/Verbal Responses: Return demonstration correctly Electronic Signature(s) Signed: 10/13/2021 5:34:33 PM By: [name] RN Entered By: [name] on 10/13/2021 15:14:58 -------------------------------------------------------------------------------- Wound Assessment Details Patient Name: Date of Service: Christina Wong 10/13/2021 2:30 PM Medical Record Number: [id number] Patient Account Number: [id number] Date of Birth/Sex: Treating RN: 21-Oct-1950 (33 [place]. [name] Primary Care Treyten Monestime: [name] Other Clinician: Referring Damont Balles: Treating Jony Ladnier/Extender: [name] in Treatment: 1 Wound Status Wound Number: 2 Primary Etiology: Abrasion Wound Location: Right, Distal, Anterior Lower Leg Wound Status: Healed -  Epithelialized Wounding Event: Trauma Comorbid History: Hypertension Date Acquired: 07/15/2021 Tania Ade  Of Treatment: 1 Clustered Wound: No Wound Measurements Length: (cm) Width: (cm) Depth: (cm) Area: (cm) Volume: (cm) 0 % Reduction in Area: 100% 0 % Reduction in Volume: 100% 0 Epithelialization: Large (67-100%) 0 Tunneling: No 0 Undermining: No Wound Description Classification: Full Thickness Without Exposed Support Structures Exudate Amount: None Present Foul Odor After Cleansing: No Slough/Fibrino No Wound Bed Granulation Amount: None Present (0%) Exposed Structure Necrotic Amount: None Present (0%) Fascia Exposed: No Fat Layer (Subcutaneous Tissue) Exposed: No Tendon Exposed: No Muscle Exposed: No Joint Exposed: No Bone Exposed: No Electronic Signature(s) Signed: 10/13/2021 5:34:33 PM By: [name] RN Entered By: [name] on 10/13/2021 15:22:09 -------------------------------------------------------------------------------- Wound Assessment Details Patient Name: Date of Service: Christina Opitz Y. 10/13/2021 2:30 PM Medical Record Number: [id number] Patient Account Number: [id number] Date of Birth/Sex: Treating RN: [birth year] (49 [place]. [name] Primary Care Kamirah Shugrue: Other Clinician: [name] Referring Shandrell Boda: Treating Musab Wingard/Extender: [name] in Treatment: 1 Wound Status Wound Number: 3 Primary Etiology: Abrasion Wound Location: Left, Anterior Lower Leg Wound Status: Open Wounding Event: Trauma Comorbid History: Hypertension Date Acquired: 07/15/2021 Weeks Of Treatment: 1 Clustered Wound: No Wound Measurements Length: (cm) 1.2 Width: (cm) 1 Depth: (cm) 0.1 Area: (cm) 0.942 Volume: (cm) 0.094 % Reduction in Area: 33.4% % Reduction in Volume: 33.3% Epithelialization: Small (1-33%) Tunneling: No Undermining: No Wound Description Classification: Full Thickness Without Exposed Support  Structures Exudate Amount: Medium Exudate Type: Serous Exudate Color: amber Foul Odor After Cleansing: No Slough/Fibrino Yes Wound Bed Granulation Amount: Small (1-33%) Exposed Structure Granulation Quality: Pink Fascia Exposed: No Necrotic Amount: Large (67-100%) Fat Layer (Subcutaneous Tissue) Exposed: Yes Necrotic Quality: Eschar, Adherent Slough Tendon Exposed: No Muscle Exposed: No Joint Exposed: No Bone Exposed: No Treatment Notes Wound #3 (Lower Leg) Wound Laterality: Left, Anterior Cleanser Soap and Water Discharge Instruction: May shower and wash wound with dial antibacterial soap and water prior to dressing change. Wound Cleanser Discharge Instruction: Cleanse the wound with wound cleanser prior to applying a clean dressing using gauze sponges, not tissue or cotton balls. Peri-Wound Care Sween Lotion (Moisturizing lotion) Discharge Instruction: Apply moisturizing lotion as directed Topical Primary Dressing KerraCel Ag Gelling Fiber Dressing, 2x2 in (silver alginate) Discharge Instruction: Apply silver alginate to wound bed as instructed Secondary Dressing Secured With Transpore Surgical Tape, 2x10 (in/yd) Discharge Instruction: Secure dressing with tape as directed. Compression Wrap Kerlix Roll 4.5x3.1 (in/yd) Discharge Instruction: Apply Kerlix and Coban compression as directed. Coban Self-Adherent Wrap 4x5 (in/yd) Discharge Instruction: Apply over Kerlix as directed. Tubular Netting #5 Discharge Instruction: Left leg Compression Stockings Add-Ons Electronic Signature(s) Signed: 10/13/2021 5:34:33 PM By: [name] RN Entered By: [name] on 10/13/2021 15:05:02 -------------------------------------------------------------------------------- Vitals Details Patient Name: Date of Service: Christina Wong. 10/13/2021 2:30 PM Medical Record Number: [id number] Patient Account Number: [id number] Date of Birth/Sex: Treating RN: 04/02/1950 (25 [place]. [name] Primary Care Kalina Morabito: [name] Other Clinician: Referring Hazley Dezeeuw: Treating Keerstin Bjelland/Extender: [name] in Treatment: 1 Vital Signs Time Taken: 15:00 Temperature (F): 97.6 Height (in): 63 Pulse (bpm): 69 Weight (lbs): 215 Respiratory Rate (breaths/min): 16 Body Mass Index (BMI): 38.1 Blood Pressure (mmHg): 120/62 Reference Range: 80 - 120 mg / dl Electronic Signature(s) Signed: 10/13/2021 5:34:33 PM By: [name] RN Entered By: [name] on 10/13/2021 15:02:10

## 2021-10-18 ENCOUNTER — Encounter (HOSPITAL_BASED_OUTPATIENT_CLINIC_OR_DEPARTMENT_OTHER): Payer: HMO | Admitting: General Surgery

## 2021-10-18 DIAGNOSIS — S81802A Unspecified open wound, left lower leg, initial encounter: Secondary | ICD-10-CM | POA: Diagnosis not present

## 2021-10-18 DIAGNOSIS — E11622 Type 2 diabetes mellitus with other skin ulcer: Secondary | ICD-10-CM | POA: Diagnosis not present

## 2021-10-18 DIAGNOSIS — L97822 Non-pressure chronic ulcer of other part of left lower leg with fat layer exposed: Secondary | ICD-10-CM | POA: Diagnosis not present

## 2021-10-18 NOTE — Progress Notes (Signed)
[name], [name] ([id number]) Visit Report for 10/18/2021 Arrival Information Details Patient Name: Date of Service: Christina Wong 10/18/2021 7:45 A M Medical Record Number: [id number] Patient Account Number: [id number] Date of Birth/Sex: Treating RN: [birth year] (71 [place]. Fredderick Phenix Primary Care Lamekia Nolden: [name] Other Clinician: Referring Marleena Shubert: Treating Ahkeem Goede/Extender: [name] in Treatment: 1 Visit Information History Since Last Visit Added or deleted any medications: No Patient Arrived: Ambulatory Any new allergies or adverse reactions: No Arrival Time: 08:01 Had a fall or experienced change in No Accompanied By: self activities of daily living that may affect Transfer Assistance: None risk of falls: Patient Identification Verified: Yes Signs or symptoms of abuse/neglect since last visito No Secondary Verification Process Completed: Yes Hospitalized since last visit: No Implantable device outside of the clinic excluding No cellular tissue based products placed in the center since last visit: Has Dressing in Place as Prescribed: Yes Has Compression in Place as Prescribed: Yes Pain Present Now: No Electronic Signature(s) Signed: 10/18/2021 4:54:27 PM By: Samuella Bruin Entered By: Samuella Bruin on 10/18/2021 08:02:10 -------------------------------------------------------------------------------- Encounter Discharge Information Details Patient Name: Date of Service: Christina Opitz Y. 10/18/2021 7:45 A M Medical Record Number: [id number] Patient Account Number: [id number] Date of Birth/Sex: Treating RN: November 17, 1950 (45 [place]. Fredderick Phenix Primary Care Krish Bailly: [name] Other Clinician: Referring Tabithia Stroder: Treating Govind Furey/Extender: [name] in Treatment: 1 Encounter Discharge Information Items Post Procedure Vitals Discharge Condition: Stable Temperature (F):  98 Ambulatory Status: Ambulatory Pulse (bpm): 89 Discharge Destination: Home Respiratory Rate (breaths/min): 16 Transportation: Private Auto Blood Pressure (mmHg): 121/77 Accompanied By: self Schedule Follow-up Appointment: Yes Clinical Summary of Care: Patient Declined Electronic Signature(s) Signed: 10/18/2021 4:54:27 PM By: Samuella Bruin Entered By: Samuella Bruin on 10/18/2021 09:37:28 -------------------------------------------------------------------------------- Lower Extremity Assessment Details Patient Name: Date of Service: Christina Opitz Y. 10/18/2021 7:45 A M Medical Record Number: [id number] Patient Account Number: [id number] Date of Birth/Sex: Treating RN: 1951/01/04 (35 [place]. Fredderick Phenix Primary Care Baylie Drakes: [name] Other Clinician: Referring Mekhi Lascola: Treating Younique Casad/Extender: [name] in Treatment: 1 Edema Assessment Assessed: [Left: No] [Right: No] E[Left: dema] [Right: :] Calf Left: Right: Point of Measurement: 30 cm From Medial Instep 27.7 cm 38 cm Ankle Left: Right: Point of Measurement: 8 cm From Medial Instep 22.8 cm 21 cm Vascular Assessment Pulses: Dorsalis Pedis Palpable: [Left:Yes] Electronic Signature(s) Signed: 10/18/2021 4:54:27 PM By: Samuella Bruin Entered By: Samuella Bruin on 10/18/2021 08:03:50 -------------------------------------------------------------------------------- Multi Wound Chart Details Patient Name: Date of Service: Christina Wong. 10/18/2021 7:45 A M Medical Record Number: [id number] Patient Account Number: [id number] Date of Birth/Sex: Treating RN: Jan 08, 1951 (24 [place]. F) Primary Care Mikal Blasdell: [name] Other Clinician: Referring Graeson Nouri: Treating Kinzie Wickes/Extender: [name] in Treatment: 1 Vital Signs Height(in): 63 Pulse(bpm): 89 Weight(lbs): 215 Blood Pressure(mmHg): 121/77 Body Mass Index(BMI):  38.1 Temperature(F): 98 Respiratory Rate(breaths/min): 16 Photos: [3:No Photos Left, Anterior Lower Leg] [N/A:N/A N/A] Wound Location: [3:Trauma] [N/A:N/A] Wounding Event: [3:Abrasion] [N/A:N/A] Primary Etiology: [3:Hypertension] [N/A:N/A] Comorbid History: [3:07/15/2021] [N/A:N/A] Date Acquired: [3:1] [N/A:N/A] Weeks of Treatment: [3:Open] [N/A:N/A] Wound Status: [3:No] [N/A:N/A] Wound Recurrence: [3:1.6x1.2x0.1] [N/A:N/A] Measurements L x W x D (cm) [3:1.508] [N/A:N/A] A (cm) : rea [3:0.151] [N/A:N/A] Volume (cm) : [3:-6.60%] [N/A:N/A] % Reduction in Area: [3:-7.10%] [N/A:N/A] % Reduction in Volume: [3:Full Thickness Without Exposed] [N/A:N/A] Classification: [3:Support Structures Medium] [N/A:N/A] Exudate A mount: [3:Serosanguineous] [N/A:N/A] Exudate Type: [3:red, brown] [N/A:N/A] Exudate Color: [3:Distinct,  outline attached] [N/A:N/A] Wound Margin: [3:Small (1-33%)] [N/A:N/A] Granulation A mount: [3:Pink] [N/A:N/A] Granulation Quality: [3:Large (67-100%)] [N/A:N/A] Necrotic A mount: [3:Fat Layer (Subcutaneous Tissue): Yes N/A] Exposed Structures: [3:Fascia: No Tendon: No Muscle: No Joint: No Bone: No Small (1-33%)] [N/A:N/A] Epithelialization: [3:Debridement - Selective/Open Wound N/A] Debridement: Pre-procedure Verification/Time Out 08:18 [N/A:N/A] Taken: [3:Lidocaine 5% topical ointment] [N/A:N/A] Pain Control: [3:Slough] [N/A:N/A] Tissue Debrided: [3:Non-Viable Tissue] [N/A:N/A] Level: [3:1.92] [N/A:N/A] Debridement A (sq cm): [3:rea Curette] [N/A:N/A] Instrument: [3:Minimum] [N/A:N/A] Bleeding: [3:Pressure] [N/A:N/A] Hemostasis A chieved: [3:0] [N/A:N/A] Procedural Pain: [3:0] [N/A:N/A] Post Procedural Pain: [3:Procedure was tolerated well] [N/A:N/A] Debridement Treatment Response: [3:1.6x1.2x0.1] [N/A:N/A] Post Debridement Measurements L x W x D (cm) [3:0.151] [N/A:N/A] Post Debridement Volume: (cm) [3:Debridement] [N/A:N/A] Treatment Notes Electronic  Signature(s) Signed: 10/18/2021 8:51:22 AM By: [name] MD FACS Entered By: [name] on 10/18/2021 08:51:22 -------------------------------------------------------------------------------- Multi-Disciplinary Care Plan Details Patient Name: Date of Service: Christina Wong. 10/18/2021 7:45 A M Medical Record Number: [id number] Patient Account Number: [id number] Date of Birth/Sex: Treating RN: [birth year] (73 [place]. Fredderick Phenix Primary Care Cinnamon Morency: [name] Other Clinician: Referring Felicita Nuncio: Treating Ayaka Andes/Extender: [name] in Treatment: 1 Active Inactive Wound/Skin Impairment Nursing Diagnoses: Knowledge deficit related to ulceration/compromised skin integrity Goals: Patient/caregiver will verbalize understanding of skin care regimen Date Initiated: 10/05/2021 Target Resolution Date: 12/05/2021 Goal Status: Active Interventions: Provide education on ulcer and skin care Treatment Activities: Skin care regimen initiated : 10/05/2021 Notes: Electronic Signature(s) Signed: 10/18/2021 4:54:27 PM By: Samuella Bruin Entered By: Samuella Bruin on 10/18/2021 08:11:45 -------------------------------------------------------------------------------- Pain Assessment Details Patient Name: Date of Service: Christina Opitz Y. 10/18/2021 7:45 A M Medical Record Number: [id number] Patient Account Number: [id number] Date of Birth/Sex: Treating RN: [birth year] (74 [place]. Fredderick Phenix Primary Care Luwanda Starr: [name] Other Clinician: Referring Laney Louderback: Treating Kadian Barcellos/Extender: [name] in Treatment: 1 Active Problems Location of Pain Severity and Description of Pain Patient Has Paino No Site Locations Rate the pain. Current Pain Level: 0 Pain Management and Medication Current Pain Management: Electronic Signature(s) Signed: 10/18/2021 4:54:27 PM By: Samuella Bruin Entered  By: Samuella Bruin on 10/18/2021 08:02:38 -------------------------------------------------------------------------------- Patient/Caregiver Education Details Patient Name: Date of Service: Christina Wong 9/13/2023andnbsp7:45 A M Medical Record Number: [id number] Patient Account Number: [id number] Date of Birth/Gender: Treating RN: 10/13/1950 (71 [place]. Fredderick Phenix Primary Care Physician: [name] Other Clinician: Referring Physician: Treating Physician/Extender: [name] in Treatment: 1 Education Assessment Education Provided To: Patient Education Topics Provided Wound/Skin Impairment: Methods: Explain/Verbal Responses: Reinforcements needed, State content correctly Electronic Signature(s) Signed: 10/18/2021 4:54:27 PM By: Samuella Bruin Entered By: Samuella Bruin on 10/18/2021 08:11:55 -------------------------------------------------------------------------------- Wound Assessment Details Patient Name: Date of Service: Christina Wong 10/18/2021 7:45 A M Medical Record Number: [id number] Patient Account Number: [id number] Date of Birth/Sex: Treating RN: Dec 06, 1950 (10 [place]. Fredderick Phenix Primary Care Nathanel Tallman: [name] Other Clinician: Referring Julian Askin: Treating Tyrece Vanterpool/Extender: [name] in Treatment: 1 Wound Status Wound Number: 3 Primary Etiology: Abrasion Wound Location: Left, Anterior Lower Leg Wound Status: Open Wounding Event: Trauma Comorbid History: Hypertension Date Acquired: 07/15/2021 Weeks Of Treatment: 1 Clustered Wound: No Wound Measurements Length: (cm) 1.6 Width: (cm) 1.2 Depth: (cm) 0.1 Area: (cm) 1.508 Volume: (cm) 0.151 % Reduction in Area: -6.6% % Reduction in Volume: -7.1% Epithelialization: Small (1-33%) Tunneling: No Undermining: No Wound Description Classification: Full Thickness Without Exposed Support Structures Wound Margin:  Distinct, outline attached Exudate Amount:  Medium Exudate Type: Serosanguineous Exudate Color: red, brown Foul Odor After Cleansing: No Slough/Fibrino Yes Wound Bed Granulation Amount: Small (1-33%) Exposed Structure Granulation Quality: Pink Fascia Exposed: No Necrotic Amount: Large (67-100%) Fat Layer (Subcutaneous Tissue) Exposed: Yes Necrotic Quality: Adherent Slough Tendon Exposed: No Muscle Exposed: No Joint Exposed: No Bone Exposed: No Treatment Notes Wound #3 (Lower Leg) Wound Laterality: Left, Anterior Cleanser Soap and Water Discharge Instruction: May shower and wash wound with dial antibacterial soap and water prior to dressing change. Wound Cleanser Discharge Instruction: Cleanse the wound with wound cleanser prior to applying a clean dressing using gauze sponges, not tissue or cotton balls. Peri-Wound Care Topical Primary Dressing KerraCel Ag Gelling Fiber Dressing, 2x2 in (silver alginate) Discharge Instruction: Apply silver alginate to wound bed as instructed Secondary Dressing Zetuvit Plus Silicone Border Dressing 4x4 (in/in) Discharge Instruction: Apply silicone border over primary dressing as directed. Secured With Compression Wrap Compression Stockings Facilities manager) Signed: 10/18/2021 4:54:27 PM By: Samuella Bruin Entered By: Samuella Bruin on 10/18/2021 08:04:35 -------------------------------------------------------------------------------- Vitals Details Patient Name: Date of Service: Christina Wong. 10/18/2021 7:45 A M Medical Record Number: [id number] Patient Account Number: [id number] Date of Birth/Sex: Treating RN: [birth year] (54 [place]. Fredderick Phenix Primary Care Mallarie Voorhies: [name] Other Clinician: Referring Laina Guerrieri: Treating Ocie Stanzione/Extender: [name] in Treatment: 1 Vital Signs Time Taken: 08:02 Temperature (F): 98 Height (in): 63 Pulse (bpm): 89 Weight (lbs):  215 Respiratory Rate (breaths/min): 16 Body Mass Index (BMI): 38.1 Blood Pressure (mmHg): 121/77 Reference Range: 80 - 120 mg / dl Electronic Signature(s) Signed: 10/18/2021 4:54:27 PM By: Samuella Bruin Entered By: Samuella Bruin on 10/18/2021 08:02:33

## 2021-10-19 DIAGNOSIS — M5416 Radiculopathy, lumbar region: Secondary | ICD-10-CM | POA: Diagnosis not present
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
## 2021-11-07 DIAGNOSIS — S81802A Unspecified open wound, left lower leg, initial encounter: Secondary | ICD-10-CM | POA: Diagnosis not present

## 2021-11-08 ENCOUNTER — Encounter (HOSPITAL_BASED_OUTPATIENT_CLINIC_OR_DEPARTMENT_OTHER): Payer: HMO | Admitting: General Surgery

## 2021-11-08 DIAGNOSIS — E669 Obesity, unspecified: Secondary | ICD-10-CM | POA: Diagnosis not present

## 2021-11-08 DIAGNOSIS — Z6837 Body mass index (BMI) 37.0-37.9, adult: Secondary | ICD-10-CM | POA: Diagnosis not present

## 2021-11-08 DIAGNOSIS — E1169 Type 2 diabetes mellitus with other specified complication: Secondary | ICD-10-CM | POA: Diagnosis not present

## 2021-11-08 DIAGNOSIS — R110 Nausea: Secondary | ICD-10-CM | POA: Diagnosis not present

## 2021-11-27 ENCOUNTER — Ambulatory Visit (INDEPENDENT_AMBULATORY_CARE_PROVIDER_SITE_OTHER): Payer: HMO

## 2021-11-27 ENCOUNTER — Ambulatory Visit (HOSPITAL_BASED_OUTPATIENT_CLINIC_OR_DEPARTMENT_OTHER): Payer: HMO | Admitting: General Surgery

## 2021-11-27 ENCOUNTER — Other Ambulatory Visit (HOSPITAL_BASED_OUTPATIENT_CLINIC_OR_DEPARTMENT_OTHER): Payer: Self-pay | Admitting: Obstetrics & Gynecology

## 2021-11-27 DIAGNOSIS — N3090 Cystitis, unspecified without hematuria: Secondary | ICD-10-CM

## 2021-11-27 DIAGNOSIS — R300 Dysuria: Secondary | ICD-10-CM | POA: Diagnosis not present

## 2021-11-27 DIAGNOSIS — N390 Urinary tract infection, site not specified: Secondary | ICD-10-CM

## 2021-11-27 DIAGNOSIS — R3129 Other microscopic hematuria: Secondary | ICD-10-CM

## 2021-11-27 LAB — POCT URINALYSIS DIPSTICK
Bilirubin, UA: NEGATIVE
Glucose, UA: NEGATIVE
Ketones, UA: NEGATIVE
Nitrite, UA: NEGATIVE
Protein, UA: NEGATIVE
Spec Grav, UA: 1.015 (ref 1.010–1.025)
Urobilinogen, UA: 0.2 E.U./dL
pH, UA: 6.5 (ref 5.0–8.0)

## 2021-11-27 MED ORDER — NITROFURANTOIN MONOHYD MACRO 100 MG PO CAPS: 100.0000 mg | ORAL_CAPSULE | Freq: Two times a day (BID) | ORAL | 1 refills | Status: DC

## 2021-11-27 NOTE — Progress Notes (Unsigned)
Patient came in today to have her urine  rechecked. Urine was positive for blood and leukocytes. Urine sent for culture. tbw

## 2021-11-30 LAB — URINE CULTURE

## 2021-12-01 NOTE — Addendum Note (Signed)
Addended by: Megan Salon on: 12/01/2021 03:26 PM   Modules accepted: Orders

## 2021-12-05 ENCOUNTER — Ambulatory Visit (INDEPENDENT_AMBULATORY_CARE_PROVIDER_SITE_OTHER): Payer: HMO

## 2021-12-05 DIAGNOSIS — R82998 Other abnormal findings in urine: Secondary | ICD-10-CM | POA: Diagnosis not present

## 2021-12-05 LAB — POCT URINALYSIS DIPSTICK
Bilirubin, UA: NEGATIVE
Blood, UA: NEGATIVE
Glucose, UA: NEGATIVE
Ketones, UA: NEGATIVE
Leukocytes, UA: NEGATIVE
Nitrite, UA: NEGATIVE
Protein, UA: NEGATIVE
Spec Grav, UA: 1.02 (ref 1.010–1.025)
Urobilinogen, UA: 0.2 E.U./dL
pH, UA: 6.5 (ref 5.0–8.0)

## 2021-12-05 NOTE — Progress Notes (Signed)
Patient came in today to recheck urine. Patient has a history of Leukocytes in the urine. Urine was checked and sent off for culture. tbw

## 2021-12-07 LAB — URINE CULTURE

## 2021-12-08 ENCOUNTER — Other Ambulatory Visit (HOSPITAL_BASED_OUTPATIENT_CLINIC_OR_DEPARTMENT_OTHER): Payer: Self-pay | Admitting: Obstetrics & Gynecology

## 2021-12-08 DIAGNOSIS — N390 Urinary tract infection, site not specified: Secondary | ICD-10-CM

## 2021-12-20 ENCOUNTER — Telehealth (HOSPITAL_BASED_OUTPATIENT_CLINIC_OR_DEPARTMENT_OTHER): Payer: Self-pay | Admitting: Obstetrics & Gynecology

## 2021-12-20 ENCOUNTER — Other Ambulatory Visit (HOSPITAL_BASED_OUTPATIENT_CLINIC_OR_DEPARTMENT_OTHER): Payer: Self-pay | Admitting: Obstetrics & Gynecology

## 2021-12-20 DIAGNOSIS — N390 Urinary tract infection, site not specified: Secondary | ICD-10-CM

## 2021-12-20 MED ORDER — NITROFURANTOIN MONOHYD MACRO 100 MG PO CAPS: 100.0000 mg | ORAL_CAPSULE | Freq: Two times a day (BID) | ORAL | 1 refills | Status: DC

## 2021-12-20 NOTE — Telephone Encounter (Signed)
Patient called and would like to have medication call in for a UTI .

## 2022-02-27 DIAGNOSIS — Z6836 Body mass index (BMI) 36.0-36.9, adult: Secondary | ICD-10-CM | POA: Diagnosis not present

## 2022-02-27 DIAGNOSIS — E1169 Type 2 diabetes mellitus with other specified complication: Secondary | ICD-10-CM | POA: Diagnosis not present

## 2022-02-27 DIAGNOSIS — I10 Essential (primary) hypertension: Secondary | ICD-10-CM | POA: Diagnosis not present

## 2022-03-02 ENCOUNTER — Ambulatory Visit
Admission: RE | Admit: 2022-03-02 | Discharge: 2022-03-02 | Disposition: A | Discharge status: Home / Self Care | Payer: PPO | Source: Ambulatory Visit | Attending: Family Medicine | Admitting: Family Medicine

## 2022-03-02 DIAGNOSIS — Z780 Asymptomatic menopausal state: Secondary | ICD-10-CM | POA: Diagnosis not present

## 2022-03-02 DIAGNOSIS — E2839 Other primary ovarian failure: Secondary | ICD-10-CM

## 2022-03-02 DIAGNOSIS — M85852 Other specified disorders of bone density and structure, left thigh: Secondary | ICD-10-CM | POA: Diagnosis not present

## 2022-03-14 ENCOUNTER — Other Ambulatory Visit (HOSPITAL_BASED_OUTPATIENT_CLINIC_OR_DEPARTMENT_OTHER): Payer: Self-pay

## 2022-03-14 DIAGNOSIS — D692 Other nonthrombocytopenic purpura: Secondary | ICD-10-CM | POA: Diagnosis not present

## 2022-03-14 DIAGNOSIS — E1136 Type 2 diabetes mellitus with diabetic cataract: Secondary | ICD-10-CM | POA: Diagnosis not present

## 2022-03-14 DIAGNOSIS — M5416 Radiculopathy, lumbar region: Secondary | ICD-10-CM | POA: Diagnosis not present

## 2022-03-14 DIAGNOSIS — I10 Essential (primary) hypertension: Secondary | ICD-10-CM | POA: Diagnosis not present

## 2022-03-14 DIAGNOSIS — E7800 Pure hypercholesterolemia, unspecified: Secondary | ICD-10-CM | POA: Diagnosis not present

## 2022-03-14 DIAGNOSIS — G479 Sleep disorder, unspecified: Secondary | ICD-10-CM | POA: Diagnosis not present

## 2022-03-14 DIAGNOSIS — G43809 Other migraine, not intractable, without status migrainosus: Secondary | ICD-10-CM | POA: Diagnosis not present

## 2022-03-14 DIAGNOSIS — J302 Other seasonal allergic rhinitis: Secondary | ICD-10-CM | POA: Diagnosis not present

## 2022-03-14 DIAGNOSIS — Z23 Encounter for immunization: Secondary | ICD-10-CM | POA: Diagnosis not present

## 2022-03-14 DIAGNOSIS — E8889 Other specified metabolic disorders: Secondary | ICD-10-CM | POA: Diagnosis not present

## 2022-03-14 DIAGNOSIS — E1169 Type 2 diabetes mellitus with other specified complication: Secondary | ICD-10-CM | POA: Diagnosis not present

## 2022-03-14 MED ORDER — MOUNJARO 12.5 MG/0.5ML ~~LOC~~ SOAJ
12.5000 mg | SUBCUTANEOUS | 0 refills | Status: DC
  Filled 2022-03-14: qty 2, 28d supply, fill #0

## 2022-03-27 ENCOUNTER — Other Ambulatory Visit (HOSPITAL_BASED_OUTPATIENT_CLINIC_OR_DEPARTMENT_OTHER): Payer: Self-pay

## 2022-03-27 DIAGNOSIS — Z6836 Body mass index (BMI) 36.0-36.9, adult: Secondary | ICD-10-CM | POA: Diagnosis not present

## 2022-03-27 DIAGNOSIS — I10 Essential (primary) hypertension: Secondary | ICD-10-CM | POA: Diagnosis not present

## 2022-03-27 DIAGNOSIS — E1169 Type 2 diabetes mellitus with other specified complication: Secondary | ICD-10-CM | POA: Diagnosis not present

## 2022-03-27 MED ORDER — MOUNJARO 12.5 MG/0.5ML ~~LOC~~ SOAJ
12.5000 mg | SUBCUTANEOUS | 3 refills | Status: DC
  Filled 2022-03-27 – 2022-04-11 (×2): qty 2, 28d supply, fill #0
  Filled 2022-05-17: qty 2, 28d supply, fill #1
  Filled 2022-06-18: qty 2, 28d supply, fill #2
  Filled 2022-07-17: qty 2, 28d supply, fill #3

## 2022-04-11 ENCOUNTER — Other Ambulatory Visit (HOSPITAL_BASED_OUTPATIENT_CLINIC_OR_DEPARTMENT_OTHER): Payer: Self-pay

## 2022-04-23 DIAGNOSIS — J029 Acute pharyngitis, unspecified: Secondary | ICD-10-CM | POA: Diagnosis not present

## 2022-04-23 DIAGNOSIS — J189 Pneumonia, unspecified organism: Secondary | ICD-10-CM | POA: Diagnosis not present

## 2022-04-23 DIAGNOSIS — R051 Acute cough: Secondary | ICD-10-CM | POA: Diagnosis not present

## 2022-04-23 DIAGNOSIS — R509 Fever, unspecified: Secondary | ICD-10-CM | POA: Diagnosis not present

## 2022-04-23 DIAGNOSIS — Z03818 Encounter for observation for suspected exposure to other biological agents ruled out: Secondary | ICD-10-CM | POA: Diagnosis not present

## 2022-04-26 DIAGNOSIS — J189 Pneumonia, unspecified organism: Secondary | ICD-10-CM | POA: Diagnosis not present

## 2022-04-26 DIAGNOSIS — R059 Cough, unspecified: Secondary | ICD-10-CM | POA: Diagnosis not present

## 2022-04-26 DIAGNOSIS — R110 Nausea: Secondary | ICD-10-CM | POA: Diagnosis not present

## 2022-05-10 DIAGNOSIS — H401131 Primary open-angle glaucoma, bilateral, mild stage: Secondary | ICD-10-CM | POA: Diagnosis not present

## 2022-05-23 DIAGNOSIS — E1169 Type 2 diabetes mellitus with other specified complication: Secondary | ICD-10-CM | POA: Diagnosis not present

## 2022-05-23 DIAGNOSIS — I10 Essential (primary) hypertension: Secondary | ICD-10-CM | POA: Diagnosis not present

## 2022-05-23 DIAGNOSIS — Z6835 Body mass index (BMI) 35.0-35.9, adult: Secondary | ICD-10-CM | POA: Diagnosis not present

## 2022-06-04 ENCOUNTER — Other Ambulatory Visit (HOSPITAL_BASED_OUTPATIENT_CLINIC_OR_DEPARTMENT_OTHER): Payer: Self-pay | Admitting: Obstetrics & Gynecology

## 2022-06-05 ENCOUNTER — Ambulatory Visit (INDEPENDENT_AMBULATORY_CARE_PROVIDER_SITE_OTHER): Payer: PPO

## 2022-06-05 DIAGNOSIS — R300 Dysuria: Secondary | ICD-10-CM | POA: Diagnosis not present

## 2022-06-05 LAB — POCT URINALYSIS DIPSTICK
Bilirubin, UA: NEGATIVE
Blood, UA: NEGATIVE
Glucose, UA: NEGATIVE
Ketones, UA: NEGATIVE
Leukocytes, UA: NEGATIVE
Nitrite, UA: NEGATIVE
Protein, UA: NEGATIVE
Spec Grav, UA: 1.025 (ref 1.010–1.025)
Urobilinogen, UA: 0.2 E.U./dL
pH, UA: 5 (ref 5.0–8.0)

## 2022-06-06 LAB — URINE CULTURE: Organism ID, Bacteria: NO GROWTH

## 2022-06-08 ENCOUNTER — Encounter (HOSPITAL_BASED_OUTPATIENT_CLINIC_OR_DEPARTMENT_OTHER): Payer: Self-pay | Admitting: Obstetrics & Gynecology

## 2022-06-08 ENCOUNTER — Other Ambulatory Visit (HOSPITAL_BASED_OUTPATIENT_CLINIC_OR_DEPARTMENT_OTHER): Payer: Self-pay | Admitting: *Deleted

## 2022-06-08 DIAGNOSIS — N390 Urinary tract infection, site not specified: Secondary | ICD-10-CM

## 2022-06-08 MED ORDER — NITROFURANTOIN MONOHYD MACRO 100 MG PO CAPS: 100.0000 mg | ORAL_CAPSULE | Freq: Two times a day (BID) | ORAL | 0 refills | Status: DC

## 2022-06-11 ENCOUNTER — Other Ambulatory Visit (HOSPITAL_BASED_OUTPATIENT_CLINIC_OR_DEPARTMENT_OTHER): Payer: Self-pay | Admitting: Obstetrics & Gynecology

## 2022-06-11 MED ORDER — CEPHALEXIN 500 MG PO CAPS: 500.0000 mg | ORAL_CAPSULE | Freq: Three times a day (TID) | ORAL | 0 refills | Status: DC

## 2022-06-19 ENCOUNTER — Other Ambulatory Visit (HOSPITAL_BASED_OUTPATIENT_CLINIC_OR_DEPARTMENT_OTHER): Payer: Self-pay

## 2022-06-19 DIAGNOSIS — Z6835 Body mass index (BMI) 35.0-35.9, adult: Secondary | ICD-10-CM | POA: Diagnosis not present

## 2022-06-19 DIAGNOSIS — I10 Essential (primary) hypertension: Secondary | ICD-10-CM | POA: Diagnosis not present

## 2022-06-19 DIAGNOSIS — E1169 Type 2 diabetes mellitus with other specified complication: Secondary | ICD-10-CM | POA: Diagnosis not present

## 2022-06-26 ENCOUNTER — Telehealth (HOSPITAL_BASED_OUTPATIENT_CLINIC_OR_DEPARTMENT_OTHER): Payer: Self-pay | Admitting: *Deleted

## 2022-06-26 NOTE — Telephone Encounter (Signed)
Pt called stating that she feels like she has a UTI again, same symptoms. She started taking the keflex today and will see urology on June 6. Advised that I would let provider know and get back to her with any recommendations.

## 2022-06-27 NOTE — Telephone Encounter (Signed)
LMOVM for pt to continue taking antibiotics and let us know if symptoms do not improve in the next day or two.

## 2022-07-03 ENCOUNTER — Other Ambulatory Visit (HOSPITAL_BASED_OUTPATIENT_CLINIC_OR_DEPARTMENT_OTHER): Payer: Self-pay | Admitting: *Deleted

## 2022-07-03 DIAGNOSIS — N390 Urinary tract infection, site not specified: Secondary | ICD-10-CM

## 2022-07-03 MED ORDER — NITROFURANTOIN MONOHYD MACRO 100 MG PO CAPS
100.0000 mg | ORAL_CAPSULE | Freq: Two times a day (BID) | ORAL | 0 refills | Status: DC
Start: 2022-07-03 — End: 2022-07-03

## 2022-07-03 MED ORDER — CEPHALEXIN 500 MG PO CAPS: 500.0000 mg | ORAL_CAPSULE | Freq: Three times a day (TID) | ORAL | 0 refills | Status: DC

## 2022-07-12 DIAGNOSIS — N3020 Other chronic cystitis without hematuria: Secondary | ICD-10-CM | POA: Diagnosis not present

## 2022-07-12 DIAGNOSIS — N952 Postmenopausal atrophic vaginitis: Secondary | ICD-10-CM | POA: Diagnosis not present

## 2022-07-17 ENCOUNTER — Other Ambulatory Visit: Payer: Self-pay

## 2022-07-17 ENCOUNTER — Other Ambulatory Visit (HOSPITAL_BASED_OUTPATIENT_CLINIC_OR_DEPARTMENT_OTHER): Payer: Self-pay

## 2022-07-18 ENCOUNTER — Other Ambulatory Visit (HOSPITAL_BASED_OUTPATIENT_CLINIC_OR_DEPARTMENT_OTHER): Payer: Self-pay

## 2022-07-18 DIAGNOSIS — Z6834 Body mass index (BMI) 34.0-34.9, adult: Secondary | ICD-10-CM | POA: Diagnosis not present

## 2022-07-18 DIAGNOSIS — E669 Obesity, unspecified: Secondary | ICD-10-CM | POA: Diagnosis not present

## 2022-07-18 DIAGNOSIS — E1169 Type 2 diabetes mellitus with other specified complication: Secondary | ICD-10-CM | POA: Diagnosis not present

## 2022-07-18 DIAGNOSIS — I10 Essential (primary) hypertension: Secondary | ICD-10-CM | POA: Diagnosis not present

## 2022-07-18 MED ORDER — MOUNJARO 12.5 MG/0.5ML ~~LOC~~ SOAJ
12.5000 mg | SUBCUTANEOUS | 3 refills | Status: DC
  Filled 2022-07-18 – 2022-08-22 (×2): qty 2, 28d supply, fill #0
  Filled 2022-09-17: qty 2, 28d supply, fill #1
  Filled 2022-10-15: qty 2, 28d supply, fill #2
  Filled 2022-11-12: qty 2, 28d supply, fill #3

## 2022-08-13 DIAGNOSIS — H401131 Primary open-angle glaucoma, bilateral, mild stage: Secondary | ICD-10-CM | POA: Diagnosis not present

## 2022-08-13 DIAGNOSIS — H524 Presbyopia: Secondary | ICD-10-CM | POA: Diagnosis not present

## 2022-08-21 ENCOUNTER — Telehealth: Payer: Self-pay | Admitting: Neurology

## 2022-08-21 NOTE — Telephone Encounter (Signed)
Pt said, weekend experienced slurred speech lasted 5 mins,headache. Headache went away after taking Tylenol yesterday. Currently having a headache, with no slurred speech.  Would lijke a call from the nurse.  Advised pt if you are experiencing stroke like symptoms go to the ED. Pt said, did not want to go to the ED because they do not do anything.

## 2022-08-21 NOTE — Progress Notes (Unsigned)
[facility] [address] [place]. [place] [address] [phone]       OFFICE FOLLOW UP NOTE  Christina Wong Date of Birth:  1950-12-12 Medical Record Number:  [id number]    Primary neurologist: Dr. Pearlean Brownie Reason for visit: Transient aphasia with headache    HPI:   Update 08/22/2022 JM: Returns for follow-up visit due to recent transient episode of word finding difficulty and headache. Occurred this past Friday 7/12. Speech symptoms lasted approximately 5 to 10 minutes before subsiding.  No recurrence since then.  Reports symptoms similar to prior episodes but aphasia not as severe and did not last this long. She did have fatigue after and laid down to take a nap.  Headache has persisted but today improved some. Located frontal and occipital.  No other associated symptoms such as weakness, confusion, vision changes, numbness/tingling or gait changes.  Remains on aspirin and Crestor.    History provided for reference purposes only Consult visit 04/26/2021 Dr. Pearlean Brownie: Christina Wong is a 72 year old pleasant Caucasian lady with past medical history of diabetes, hypertension, hyperlipidemia and TIA versus complicated migraine.  History is obtained from the patient and review of electronic medical records and opossum reviewed pertinent available imaging films in PACS.  She presented to [facility] ED on 02/14/2021 with difficulty speaking.  She states her last known normal was the night before when she went to bed.  She woke up in the morning and had a mild headache and was not feeling well so she went back to sleep.  A few hours later when she woke up she tried to speak to the husband and noticed difficulty finding her words and continues to have a mild headache.  She denies any trouble with vision or extremity weakness numbness or gait problems.  She has had similar problems in 2015 for which she was evaluated by me in brain imaging and neurovascular studies were unremarkable.   She was thought to have complicated migraine.  She has lost to follow-up after last office visit in 2017.  She had been advised to take Plavix which she ran out of her prescription and plan her primary care physician did not fill it.  Patient has CT scan of the head at [facility] ED which showed a small hemorrhagic lesion in the medial temporal lobe and she was transferred to [facility] for MRI scan which however did not show any acute hemorrhage or acute abnormality and only changes of small vessel disease.  Patient states she is done well since then and has had no recurrent episodes of either headaches or speech or language difficulties.  This whole episode lasted a couple of hours and was quite similar to the episode she did have previously in 2015 when she first saw me.  In some of those episodes she lost her peripheral vision and had some blurred vision in addition to speech difficulties.  MRI brain and MRA of the brain on 09/02/2013 were normal.  She was initially thought to have left brain TIA in fact participate in the Socrates study for stroke prevention.  She has no prior history of migraine headaches and there is no strong family history of migraines either.   ROS:   14 system review of systems is positive for those listed in HPI and all other systems negative  PMH:  Past Medical History:  Diagnosis Date   Aphasia 07/11/2013   x 10-15 min   Elevated liver function tests    One  of her studies are slightly elevated which may represent fatty liver from her Diabetes and her obesity or may represent an adverse effect from the Crestor.   Exogenous obesity    She has had a problem with   Headache(784.0)    "daily to weekly lately" (09/02/2013)   Hypercholesterolemia    Hypertension    hx of essential- on meds   Infertility    Leg cramps    in last 6 months   Neck stiffness 07/13/2014   and neck pain   PONV (postoperative nausea and vomiting)    TIA (transient ischemic attack) 09/01/2013    "that's what they think"   Type II diabetes mellitus (HCC)    mild   Walking pneumonia ~ 1972    Social History:  Social History   Socioeconomic History   Marital status: Married    Spouse name: Not on file   Number of children: 1   Years of education: BA   Highest education level: Not on file  Occupational History   Not on file  Tobacco Use   Smoking status: Never    Passive exposure: Never   Smokeless tobacco: Never  Vaping Use   Vaping status: Never Used  Substance and Sexual Activity   Alcohol use: Yes    Alcohol/week: 7.0 standard drinks of alcohol    Types: 7 Glasses of wine per week   Drug use: No   Sexual activity: Yes    Partners: Male  Other Topics Concern   Not on file  Social History Narrative   Patient is married with 1 biological child, and 1 adopted child.   Patient is right handed.   Patient has her Bachelor's degree.   Patient drinks 1 cup daily.   Social Determinants of Health   Financial Resource Strain: Not on file  Food Insecurity: Not on file  Transportation Needs: Not on file  Physical Activity: Not on file  Stress: Not on file  Social Connections: Not on file  Intimate Partner Violence: Not on file    Medications:   Current Outpatient Medications on File Prior to Visit  Medication Sig Dispense Refill   B Complex Vitamins (VITAMIN B COMPLEX PO) Take 1 tablet by mouth daily.     Calcium Carbonate (CALCIUM 600 PO) Take 600 mg by mouth 2 (two) times daily.     cephALEXin (KEFLEX) 500 MG capsule Take 1 capsule (500 mg total) by mouth 3 (three) times daily. 21 capsule 0   Dulaglutide 1.5 MG/0.5ML SOPN Inject 1.5 mg into the skin once a week.     gabapentin (NEURONTIN) 100 MG capsule Take 100 mg by mouth 3 (three) times daily.     losartan (COZAAR) 50 MG tablet Take 50 mg by mouth daily.     meloxicam (MOBIC) 7.5 MG tablet Take 7.5 mg by mouth daily.     metFORMIN (GLUCOPHAGE) 500 MG tablet Take 500 mg by mouth 2 (two) times daily with a  meal.     Multiple Vitamins-Minerals (HAIR SKIN AND NAILS FORMULA PO) Take 1 tablet by mouth daily.     phenazopyridine (PYRIDIUM) 200 MG tablet Take 1 tablet (200 mg total) by mouth 3 (three) times daily as needed for pain. 10 tablet 0   potassium chloride SA (K-DUR,KLOR-CON) 20 MEQ tablet Take 20 mEq by mouth 2 (two) times daily.     rosuvastatin (CRESTOR) 5 MG tablet Take 5 mg by mouth every morning.     tirzepatide (MOUNJARO) 12.5 MG/0.5ML Pen  Inject 12.5 mg into the skin once a week. 2 mL 3   VITAMIN D PO Take 1,000 Units by mouth daily.     zolpidem (AMBIEN) 10 MG tablet Take 5 mg by mouth at bedtime.     [DISCONTINUED] Saxagliptin HCl (ONGLYZA PO) Take 1,000 mg by mouth.       No current facility-administered medications on file prior to visit.    Allergies:   Allergies  Allergen Reactions   Lipitor [Atorvastatin] Other (See Comments)    Leg cramps   Onion Nausea And Vomiting   Bactrim [Sulfamethoxazole-Trimethoprim] Other (See Comments)    Muscle issues    Physical Exam Today's Vitals   08/22/22 0806  BP: 100/67  Pulse: 83  Weight: 196 lb 3.2 oz (89 kg)  Height: 5\' 2"  (1.575 m)   Body mass index is 35.89 kg/m.  General: Obese very pleasant elderly Caucasian lady seated, in no evident distress Head: head normocephalic and atraumatic.   Neck: supple with no carotid or supraclavicular bruits Cardiovascular: regular rate and rhythm, no murmurs Musculoskeletal: no deformity Skin:  no rash/petichiae Vascular:  Normal pulses all extremities  Neurologic Exam Mental Status: Awake and fully alert.  Fluent speech and language.  Oriented to place and time. Recent and remote memory intact. Attention span, concentration and fund of knowledge appropriate. Mood and affect appropriate.  Cranial Nerves: Pupils equal, briskly reactive to light. Extraocular movements full without nystagmus. Visual fields full to confrontation. Hearing intact. Facial sensation intact. Face, tongue,  palate moves normally and symmetrically.  Motor: Normal bulk and tone. Normal strength in all tested extremity muscles. Sensory.: intact to touch , pinprick , position and vibratory sensation.  Coordination: Rapid alternating movements normal in all extremities. Finger-to-nose and heel-to-shin performed accurately bilaterally. Gait and Station: Arises from chair without difficulty. Stance is normal. Gait demonstrates normal stride length and balance without use of AD. Reflexes: 1+ and symmetric. Toes downgoing.       ASSESSMENT/PLAN: 72 year old Caucasian lady with transient episode of expressive aphasia and speech difficulties with a mild headache in 2015, 02/2021 and recently on 7/12. Most recent episode similar to prior except aphasia not as severe and only lasted 5 to 10 minutes.  Likely complicated migraine although per Dr. Pearlean Brownie, left hemispheric TIA is also possibility given advanced age and risk factors.     1.  Transient aphasia with headache (complicated migraine vs TIA)  -Mild headache persists - recommend taking Nurtec for 3-4 days to help break current headache cycle. If persists after that time, can try steroid taper pack. Provided sample of Nurtec to take when episode occurs to see if this helps abort symptoms/headache - if beneficial, patient will call for official order  -She was advised to call if headache persists or worsens  -She was advised to call with any additional episodes or proceed to ED with any additional episodes with new or prolonged symptoms  -No indication for prophylactic therapy as episodes infrequent  -Continue aspirin and Crestor for stroke prevention measures and close PCP follow-up for stroke risk factor management  -Extensive workup previously completed for similar symptoms, no new symptoms associated and neuro exam intact. Will hold off on repeat MRI at this time   -MRI brain 02/2021 unremarkable   -CTA head/neck 05/2021 right proximal ICA 50% stenosis and  left proximal ICA 30 to 40% stenosis, mild calcified plaque in internal ICAs without hemodynamically significant stenosis   -EEG 05/2021 normal   2.  Carotid stenosis  -recommend repeat  carotid ultrasound for surveillance monitoring     Can follow-up on an as-needed basis at this time    I spent 40 minutes of face-to-face and non-face-to-face time with patient.  This included previsit chart review, lab review, study review, order entry, electronic health record documentation, patient education and discussion regarding above diagnoses and treatment plan and answered all other questions to patient's satisfaction  Ihor Austin, [facility]  [facility] [address] [place], [place] [address]  Phone [phone] Fax [phone] Note: This document was prepared with digital dictation and possible smart phrase technology. Any transcriptional errors that result from this process are unintentional.  CC:  [facility] provider: Dr. Pearlean Brownie

## 2022-08-22 ENCOUNTER — Encounter: Payer: Self-pay | Admitting: Adult Health

## 2022-08-22 ENCOUNTER — Other Ambulatory Visit (HOSPITAL_BASED_OUTPATIENT_CLINIC_OR_DEPARTMENT_OTHER): Payer: Self-pay

## 2022-08-22 ENCOUNTER — Ambulatory Visit: Payer: Self-pay | Admitting: Adult Health

## 2022-08-22 VITALS — BP 100/67 | HR 83 | Ht 62.0 in | Wt 196.2 lb

## 2022-08-22 DIAGNOSIS — G43109 Migraine with aura, not intractable, without status migrainosus: Secondary | ICD-10-CM | POA: Diagnosis not present

## 2022-08-22 DIAGNOSIS — R4701 Aphasia: Secondary | ICD-10-CM

## 2022-08-22 DIAGNOSIS — I6523 Occlusion and stenosis of bilateral carotid arteries: Secondary | ICD-10-CM

## 2022-08-22 MED ORDER — NURTEC 75 MG PO TBDP: 75.0000 mg | ORAL_TABLET | Freq: Every day | ORAL | Status: DC | PRN

## 2022-08-22 NOTE — Patient Instructions (Addendum)
Your Plan:  You will be called to schedule a carotid ultrasound for monitoring of carotid stenosis (narrowing)  Please call with any persistent or worsening headache or any reoccurrence of these symptoms  Recommend taking Nurtec over the next 3-4 days to see if this helps break current migraine cycle. If you still have the headache by next week, please let me know and we can try a steroid taper pack.   Recommend trying Nurtec to take at onset of symptoms - if beneficial, please let me know       Thank you for coming to see [place] at [facility]. I hope we have been able to provide you high quality care today.  You may receive a patient satisfaction survey over the next few weeks. We would appreciate your feedback and comments so that we may continue to improve ourselves and the health of our patients.

## 2022-08-27 ENCOUNTER — Other Ambulatory Visit: Payer: Self-pay | Admitting: Physical Medicine and Rehabilitation

## 2022-08-27 DIAGNOSIS — Z1231 Encounter for screening mammogram for malignant neoplasm of breast: Secondary | ICD-10-CM

## 2022-09-01 NOTE — Progress Notes (Signed)
I agree with the above plan 

## 2022-09-14 ENCOUNTER — Ambulatory Visit: Payer: PPO

## 2022-09-17 ENCOUNTER — Ambulatory Visit (HOSPITAL_COMMUNITY)
Admission: RE | Admit: 2022-09-17 | Discharge: 2022-09-17 | Disposition: A | Discharge status: Home / Self Care | Payer: PPO | Source: Ambulatory Visit | Attending: Adult Health | Admitting: Adult Health

## 2022-09-17 ENCOUNTER — Telehealth: Payer: Self-pay | Admitting: Anesthesiology

## 2022-09-17 DIAGNOSIS — I6523 Occlusion and stenosis of bilateral carotid arteries: Secondary | ICD-10-CM | POA: Diagnosis not present

## 2022-09-17 NOTE — Telephone Encounter (Signed)
Called pt with carotid U/S results. Per Ihor Austin: carotid ultrasound did not show any concerning findings and showed mild carotid stenosis at 1 to 39% bilaterally.  Recommend she continue aspirin and Crestor and continued close follow-up with PCP for stroke risk factor management. Pt verbalized understanding. Pt had no additional questions at this time but was encouraged to call back if questions arise.

## 2022-09-17 NOTE — Telephone Encounter (Signed)
-----   Message from Skelp sent at 09/17/2022 11:33 AM EDT ----- Please advise patient that recent carotid ultrasound did not show any concerning findings and showed mild carotid stenosis at 1 to 39% bilaterally.  Recommend she continue aspirin and Crestor and continued close follow-up with PCP for stroke risk factor management.

## 2022-09-17 NOTE — Progress Notes (Signed)
VASCULAR LAB    Carotid duplex has been performed.  See CV proc for preliminary results.   Yocheved Depner, RVT 09/17/2022, 9:30 AM

## 2022-09-18 ENCOUNTER — Other Ambulatory Visit: Payer: Self-pay

## 2022-09-18 ENCOUNTER — Telehealth: Payer: Self-pay | Admitting: Neurology

## 2022-09-18 ENCOUNTER — Encounter: Payer: Self-pay | Admitting: Neurology

## 2022-09-18 ENCOUNTER — Ambulatory Visit
Admission: RE | Admit: 2022-09-18 | Discharge: 2022-09-18 | Disposition: A | Discharge status: Home / Self Care | Payer: PPO | Source: Ambulatory Visit | Attending: Physical Medicine and Rehabilitation | Admitting: Physical Medicine and Rehabilitation

## 2022-09-18 DIAGNOSIS — Z1231 Encounter for screening mammogram for malignant neoplasm of breast: Secondary | ICD-10-CM | POA: Diagnosis not present

## 2022-09-18 MED ORDER — NURTEC 75 MG PO TBDP: 75.0000 mg | ORAL_TABLET | ORAL | 11 refills | Status: AC | PRN

## 2022-09-18 NOTE — Telephone Encounter (Signed)
PA is completed. Awaiting determination

## 2022-09-18 NOTE — Telephone Encounter (Signed)
Prescription for Nurtec placed as requested. Take 1 tab at migraine onset, no more than 75mg /24 hrs. Triptans contraindicated due to TIA/stroke history. Will likely need PA - please advise patient of this. If med not approved by the time she needs to leave, we should be able to give her some additional samples during that time.  Thank you.

## 2022-09-18 NOTE — Telephone Encounter (Signed)
PA approved for the patient until nov 2024  13-AUG-24:13-NOV-24 Nurtec 75MG  OR TBDP Quantity:16;

## 2022-09-18 NOTE — Telephone Encounter (Signed)
PA approved for nurtec

## 2022-09-18 NOTE — Telephone Encounter (Signed)
PA submitted for the pt through [employer] [id number] Will await determination

## 2022-09-19 DIAGNOSIS — J302 Other seasonal allergic rhinitis: Secondary | ICD-10-CM | POA: Diagnosis not present

## 2022-09-19 DIAGNOSIS — Z0000 Encounter for general adult medical examination without abnormal findings: Secondary | ICD-10-CM | POA: Diagnosis not present

## 2022-09-19 DIAGNOSIS — E1136 Type 2 diabetes mellitus with diabetic cataract: Secondary | ICD-10-CM | POA: Diagnosis not present

## 2022-09-19 DIAGNOSIS — G43809 Other migraine, not intractable, without status migrainosus: Secondary | ICD-10-CM | POA: Diagnosis not present

## 2022-09-19 DIAGNOSIS — E1169 Type 2 diabetes mellitus with other specified complication: Secondary | ICD-10-CM | POA: Diagnosis not present

## 2022-09-19 DIAGNOSIS — I10 Essential (primary) hypertension: Secondary | ICD-10-CM | POA: Diagnosis not present

## 2022-09-19 DIAGNOSIS — Z1331 Encounter for screening for depression: Secondary | ICD-10-CM | POA: Diagnosis not present

## 2022-09-19 DIAGNOSIS — G479 Sleep disorder, unspecified: Secondary | ICD-10-CM | POA: Diagnosis not present

## 2022-09-19 DIAGNOSIS — M5416 Radiculopathy, lumbar region: Secondary | ICD-10-CM | POA: Diagnosis not present

## 2022-09-19 DIAGNOSIS — E7800 Pure hypercholesterolemia, unspecified: Secondary | ICD-10-CM | POA: Diagnosis not present

## 2022-09-20 ENCOUNTER — Other Ambulatory Visit (HOSPITAL_BASED_OUTPATIENT_CLINIC_OR_DEPARTMENT_OTHER): Payer: Self-pay

## 2022-09-28 DIAGNOSIS — M5416 Radiculopathy, lumbar region: Secondary | ICD-10-CM | POA: Diagnosis not present

## 2022-10-09 DIAGNOSIS — J40 Bronchitis, not specified as acute or chronic: Secondary | ICD-10-CM | POA: Diagnosis not present

## 2022-10-09 DIAGNOSIS — J329 Chronic sinusitis, unspecified: Secondary | ICD-10-CM | POA: Diagnosis not present

## 2022-10-09 DIAGNOSIS — R058 Other specified cough: Secondary | ICD-10-CM | POA: Diagnosis not present

## 2022-10-15 ENCOUNTER — Other Ambulatory Visit (HOSPITAL_BASED_OUTPATIENT_CLINIC_OR_DEPARTMENT_OTHER): Payer: Self-pay

## 2022-11-06 DIAGNOSIS — M5416 Radiculopathy, lumbar region: Secondary | ICD-10-CM | POA: Diagnosis not present

## 2022-11-12 ENCOUNTER — Ambulatory Visit: Payer: PPO | Admitting: Physical Therapy

## 2022-11-12 ENCOUNTER — Other Ambulatory Visit (HOSPITAL_BASED_OUTPATIENT_CLINIC_OR_DEPARTMENT_OTHER): Payer: Self-pay

## 2022-11-15 DIAGNOSIS — N952 Postmenopausal atrophic vaginitis: Secondary | ICD-10-CM | POA: Diagnosis not present

## 2022-11-15 DIAGNOSIS — N3020 Other chronic cystitis without hematuria: Secondary | ICD-10-CM | POA: Diagnosis not present

## 2022-11-16 ENCOUNTER — Ambulatory Visit: Payer: PPO | Admitting: Physical Therapy

## 2022-11-20 DIAGNOSIS — H401131 Primary open-angle glaucoma, bilateral, mild stage: Secondary | ICD-10-CM | POA: Diagnosis not present

## 2022-12-12 ENCOUNTER — Other Ambulatory Visit (HOSPITAL_BASED_OUTPATIENT_CLINIC_OR_DEPARTMENT_OTHER): Payer: Self-pay

## 2022-12-12 DIAGNOSIS — Z6835 Body mass index (BMI) 35.0-35.9, adult: Secondary | ICD-10-CM | POA: Diagnosis not present

## 2022-12-12 DIAGNOSIS — E1169 Type 2 diabetes mellitus with other specified complication: Secondary | ICD-10-CM | POA: Diagnosis not present

## 2022-12-12 DIAGNOSIS — E66812 Obesity, class 2: Secondary | ICD-10-CM | POA: Diagnosis not present

## 2022-12-12 DIAGNOSIS — I10 Essential (primary) hypertension: Secondary | ICD-10-CM | POA: Diagnosis not present

## 2022-12-12 MED ORDER — MOUNJARO 15 MG/0.5ML ~~LOC~~ SOAJ
15.0000 mg | SUBCUTANEOUS | 3 refills | Status: DC
Start: 2022-12-12 — End: 2023-11-18
  Filled 2022-12-12: qty 2, 28d supply, fill #0
  Filled 2023-01-08: qty 2, 28d supply, fill #1
  Filled 2023-02-13: qty 2, 28d supply, fill #2
  Filled 2023-03-18: qty 2, 28d supply, fill #3

## 2023-01-08 ENCOUNTER — Other Ambulatory Visit (HOSPITAL_BASED_OUTPATIENT_CLINIC_OR_DEPARTMENT_OTHER): Payer: Self-pay

## 2023-02-06 DIAGNOSIS — R059 Cough, unspecified: Secondary | ICD-10-CM | POA: Diagnosis not present

## 2023-02-06 DIAGNOSIS — J189 Pneumonia, unspecified organism: Secondary | ICD-10-CM | POA: Diagnosis not present

## 2023-02-08 ENCOUNTER — Telehealth (HOSPITAL_BASED_OUTPATIENT_CLINIC_OR_DEPARTMENT_OTHER): Payer: Self-pay

## 2023-02-08 NOTE — Telephone Encounter (Signed)
 Attempted to call pt in regards to annual exam. No answer so I left pt a vm to return office call to get her scheduled for her annual exam.

## 2023-03-20 ENCOUNTER — Other Ambulatory Visit (HOSPITAL_BASED_OUTPATIENT_CLINIC_OR_DEPARTMENT_OTHER): Payer: Self-pay

## 2023-03-21 ENCOUNTER — Other Ambulatory Visit (HOSPITAL_BASED_OUTPATIENT_CLINIC_OR_DEPARTMENT_OTHER): Payer: Self-pay

## 2023-03-21 MED ORDER — MOUNJARO 15 MG/0.5ML ~~LOC~~ SOAJ
15.0000 mg | SUBCUTANEOUS | 3 refills | Status: DC
  Filled 2023-03-21 – 2023-06-24 (×3): qty 2, 28d supply, fill #0
  Filled 2023-07-29 – 2023-11-12 (×2): qty 2, 28d supply, fill #1

## 2023-03-22 DIAGNOSIS — N3020 Other chronic cystitis without hematuria: Secondary | ICD-10-CM | POA: Diagnosis not present

## 2023-03-27 ENCOUNTER — Other Ambulatory Visit (HOSPITAL_BASED_OUTPATIENT_CLINIC_OR_DEPARTMENT_OTHER): Payer: Self-pay

## 2023-04-09 DIAGNOSIS — Z23 Encounter for immunization: Secondary | ICD-10-CM | POA: Diagnosis not present

## 2023-04-17 DIAGNOSIS — R3129 Other microscopic hematuria: Secondary | ICD-10-CM | POA: Diagnosis not present

## 2023-04-17 DIAGNOSIS — R3121 Asymptomatic microscopic hematuria: Secondary | ICD-10-CM | POA: Diagnosis not present

## 2023-04-17 DIAGNOSIS — Z9049 Acquired absence of other specified parts of digestive tract: Secondary | ICD-10-CM | POA: Diagnosis not present

## 2023-04-17 DIAGNOSIS — R319 Hematuria, unspecified: Secondary | ICD-10-CM | POA: Diagnosis not present

## 2023-04-17 DIAGNOSIS — K449 Diaphragmatic hernia without obstruction or gangrene: Secondary | ICD-10-CM | POA: Diagnosis not present

## 2023-04-30 ENCOUNTER — Other Ambulatory Visit (HOSPITAL_BASED_OUTPATIENT_CLINIC_OR_DEPARTMENT_OTHER): Payer: Self-pay

## 2023-04-30 DIAGNOSIS — E66812 Obesity, class 2: Secondary | ICD-10-CM | POA: Diagnosis not present

## 2023-04-30 DIAGNOSIS — I10 Essential (primary) hypertension: Secondary | ICD-10-CM | POA: Diagnosis not present

## 2023-04-30 DIAGNOSIS — Z6835 Body mass index (BMI) 35.0-35.9, adult: Secondary | ICD-10-CM | POA: Diagnosis not present

## 2023-04-30 DIAGNOSIS — E1169 Type 2 diabetes mellitus with other specified complication: Secondary | ICD-10-CM | POA: Diagnosis not present

## 2023-04-30 MED ORDER — MOUNJARO 15 MG/0.5ML ~~LOC~~ SOAJ
15.0000 mg | SUBCUTANEOUS | 3 refills | Status: DC
  Filled 2023-04-30: qty 2, 28d supply, fill #0
  Filled 2023-05-24: qty 2, 28d supply, fill #1
  Filled 2023-07-29: qty 2, 28d supply, fill #2
  Filled 2023-09-03 – 2023-11-12 (×2): qty 2, 28d supply, fill #3

## 2023-05-10 DIAGNOSIS — N952 Postmenopausal atrophic vaginitis: Secondary | ICD-10-CM | POA: Diagnosis not present

## 2023-05-10 DIAGNOSIS — N3020 Other chronic cystitis without hematuria: Secondary | ICD-10-CM | POA: Diagnosis not present

## 2023-05-10 DIAGNOSIS — R3121 Asymptomatic microscopic hematuria: Secondary | ICD-10-CM | POA: Diagnosis not present

## 2023-05-24 ENCOUNTER — Other Ambulatory Visit: Payer: Self-pay

## 2023-05-24 ENCOUNTER — Other Ambulatory Visit (HOSPITAL_BASED_OUTPATIENT_CLINIC_OR_DEPARTMENT_OTHER): Payer: Self-pay

## 2023-05-30 ENCOUNTER — Ambulatory Visit (HOSPITAL_BASED_OUTPATIENT_CLINIC_OR_DEPARTMENT_OTHER): Payer: PPO | Admitting: Obstetrics & Gynecology

## 2023-06-06 DIAGNOSIS — H401131 Primary open-angle glaucoma, bilateral, mild stage: Secondary | ICD-10-CM | POA: Diagnosis not present

## 2023-06-20 ENCOUNTER — Ambulatory Visit (HOSPITAL_BASED_OUTPATIENT_CLINIC_OR_DEPARTMENT_OTHER): Payer: PPO | Admitting: Obstetrics & Gynecology

## 2023-06-24 ENCOUNTER — Other Ambulatory Visit (HOSPITAL_BASED_OUTPATIENT_CLINIC_OR_DEPARTMENT_OTHER): Payer: Self-pay

## 2023-07-17 DIAGNOSIS — M5416 Radiculopathy, lumbar region: Secondary | ICD-10-CM | POA: Diagnosis not present

## 2023-07-17 DIAGNOSIS — I10 Essential (primary) hypertension: Secondary | ICD-10-CM | POA: Diagnosis not present

## 2023-07-17 DIAGNOSIS — E66812 Obesity, class 2: Secondary | ICD-10-CM | POA: Diagnosis not present

## 2023-07-17 DIAGNOSIS — E1169 Type 2 diabetes mellitus with other specified complication: Secondary | ICD-10-CM | POA: Diagnosis not present

## 2023-07-17 DIAGNOSIS — Z6835 Body mass index (BMI) 35.0-35.9, adult: Secondary | ICD-10-CM | POA: Diagnosis not present

## 2023-07-22 DIAGNOSIS — H1045 Other chronic allergic conjunctivitis: Secondary | ICD-10-CM | POA: Diagnosis not present

## 2023-07-29 ENCOUNTER — Other Ambulatory Visit (HOSPITAL_BASED_OUTPATIENT_CLINIC_OR_DEPARTMENT_OTHER): Payer: Self-pay

## 2023-07-30 DIAGNOSIS — H1045 Other chronic allergic conjunctivitis: Secondary | ICD-10-CM | POA: Diagnosis not present

## 2023-08-19 DIAGNOSIS — M25561 Pain in right knee: Secondary | ICD-10-CM | POA: Diagnosis not present

## 2023-08-19 DIAGNOSIS — M5416 Radiculopathy, lumbar region: Secondary | ICD-10-CM | POA: Diagnosis not present

## 2023-08-26 IMAGING — MR MR HEAD W/O CM
12 of 13 series · 44 of 48 positions shown · non-contrast
Comparison: Prior CT from 02/14/2021.

CLINICAL DATA: Initial evaluation for neuro deficit, stroke
suspected.

EXAM:
MRI HEAD WITHOUT CONTRAST
TECHNIQUE: Multiplanar, multiecho pulse sequences of the brain and surrounding
structures were obtained without intravenous contrast.

[Series 5: DWI · axial · 3.0mm · 0.88mm/px · z∈[-146,+19]mm · 7 of 112 slices shown (1 of 4)]
[im 1/112]
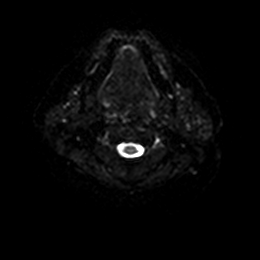
[im 19/112]
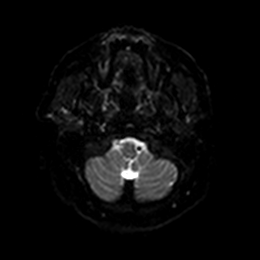
[im 38/112]
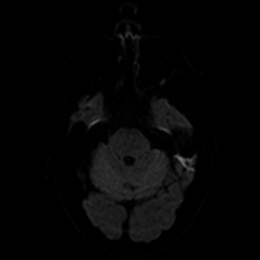
[im 56/112]
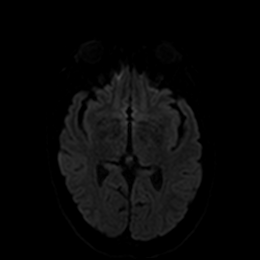
[im 75/112]
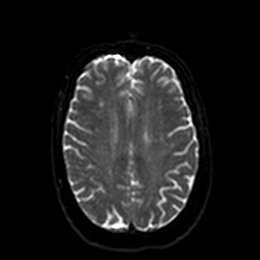
[im 93/112]
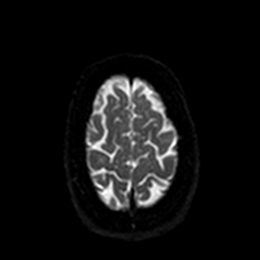
[im 112/112]
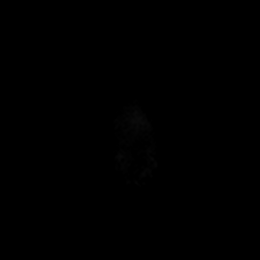

[Series 6: DWI · axial · 3.0mm · 0.88mm/px · z∈[-146,+19]mm · 4 of 56 slices shown (2 of 4)]
[im 1/56]
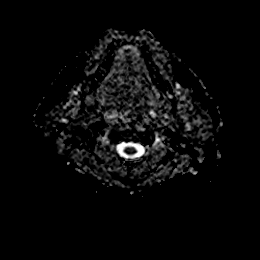
[im 19/56]
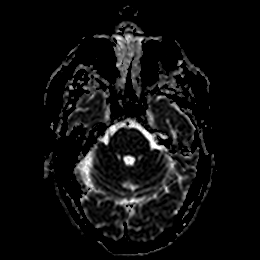
[im 37/56]
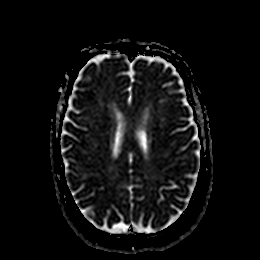
[im 56/56]
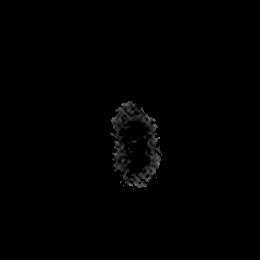

[Series 7: DWI · coronal · 4.0mm · 0.88mm/px · 6 of 80 slices shown (3 of 4)]
[im 1/80]
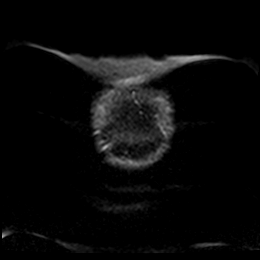
[im 16/80]
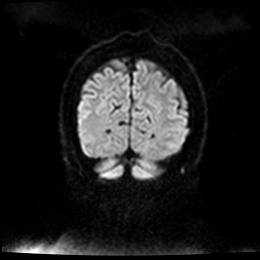
[im 32/80]
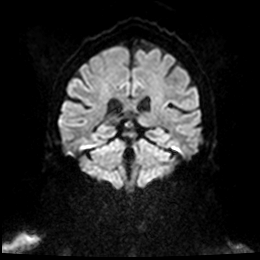
[im 48/80]
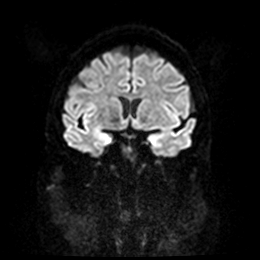
[im 64/80]
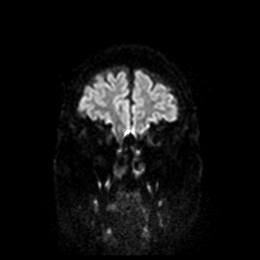
[im 80/80]
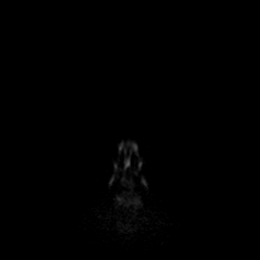

[Series 8: DWI · coronal · 4.0mm · 0.88mm/px · 3 of 40 slices shown (4 of 4)]
[im 1/40]
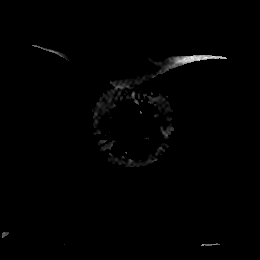
[im 20/40]
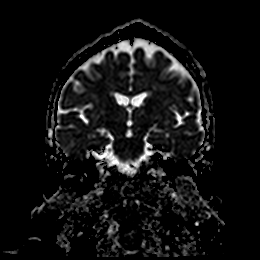
[im 40/40]
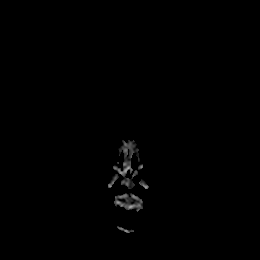

[Series 9: T1 · sagittal · 5.0mm · 0.75mm/px · 2 of 27 slices shown]
[im 1/27]
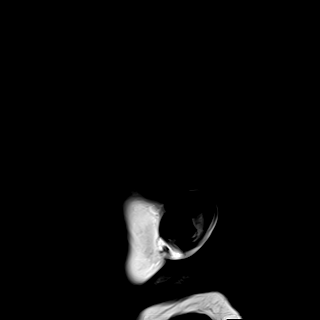
[im 27/27]
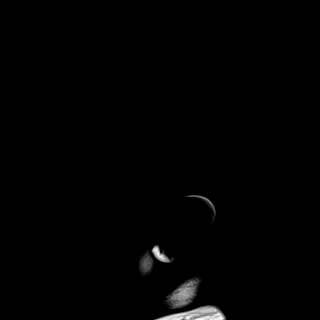

[Series 10: T2 · axial · 5.0mm · 0.75mm/px · z∈[-148,+20]mm · 2 of 29 slices shown (1 of 2)]
[im 1/29]
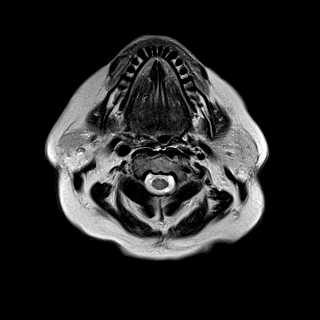
[im 29/29]
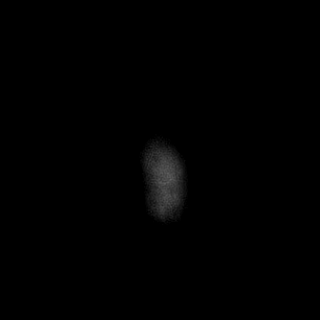

[Series 11: FLAIR · axial · 5.0mm · 0.47mm/px · z∈[-147,+21]mm · 2 of 29 slices shown]
[im 1/29]
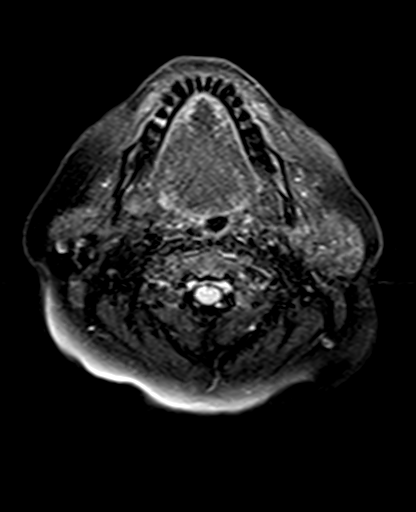
[im 29/29]
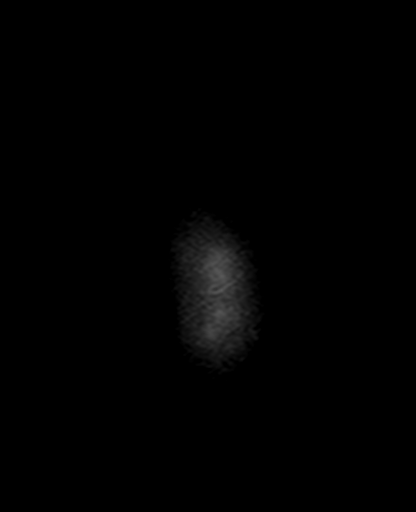

[Series 12: mag_images · axial · 3.0mm · 0.94mm/px · z∈[-145,+19]mm · 4 of 56 slices shown]
[im 1/56]
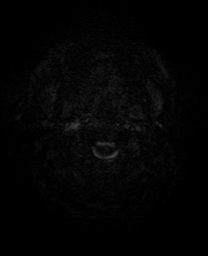
[im 19/56]
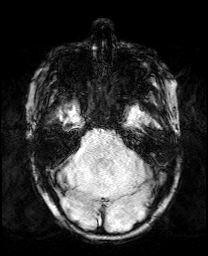
[im 37/56]
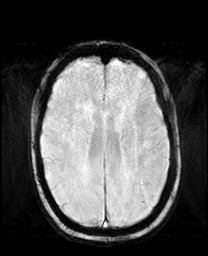
[im 56/56]
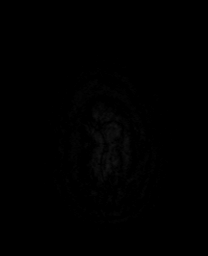

[Series 13: pha_images · axial · 3.0mm · 0.94mm/px · z∈[-145,+19]mm · 4 of 56 slices shown]
[im 1/56]
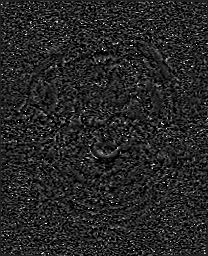
[im 19/56]
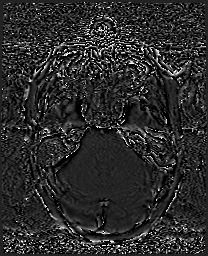
[im 37/56]
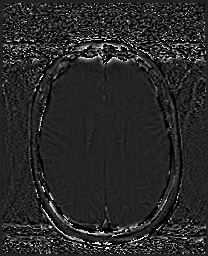
[im 56/56]
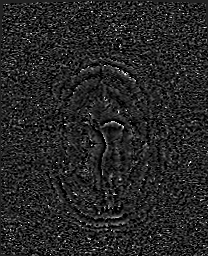

[Series 14: swi_images · axial · 3.0mm · 0.94mm/px · z∈[-145,+19]mm · 4 of 56 slices shown]
[im 1/56]
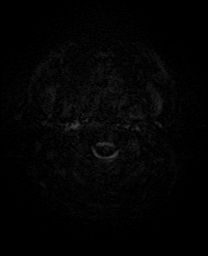
[im 19/56]
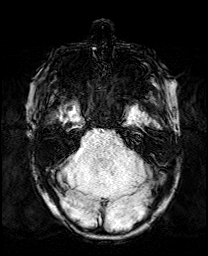
[im 37/56]
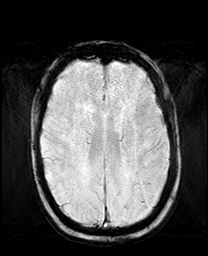
[im 56/56]
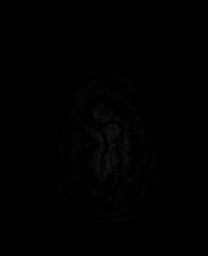

[Series 15: mip_images(sw) · axial · 24.0mm · 0.94mm/px · z∈[-135,+9]mm · 3 of 49 slices shown]
[im 1/49]
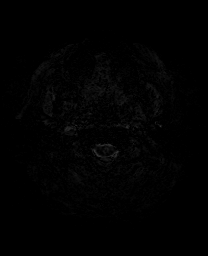
[im 25/49]
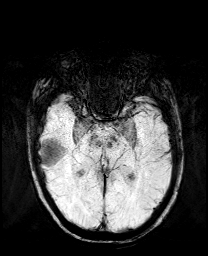
[im 49/49]
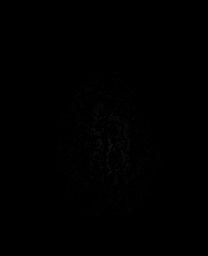

[Series 18: T2 · coronal · 5.0mm · 0.34mm/px · 3 of 36 slices shown (2 of 2)]
[im 1/36]
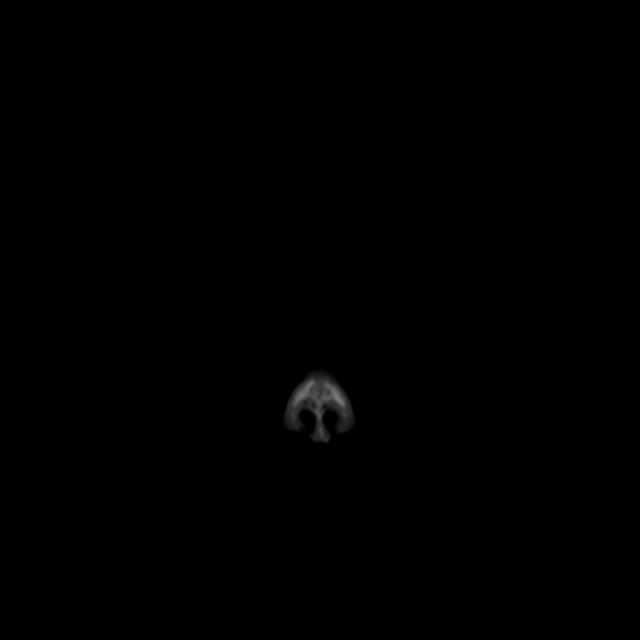
[im 18/36]
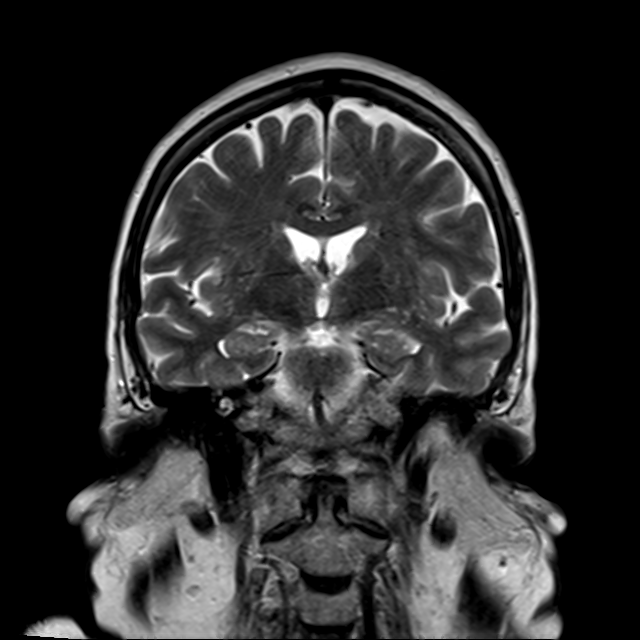
[im 36/36]
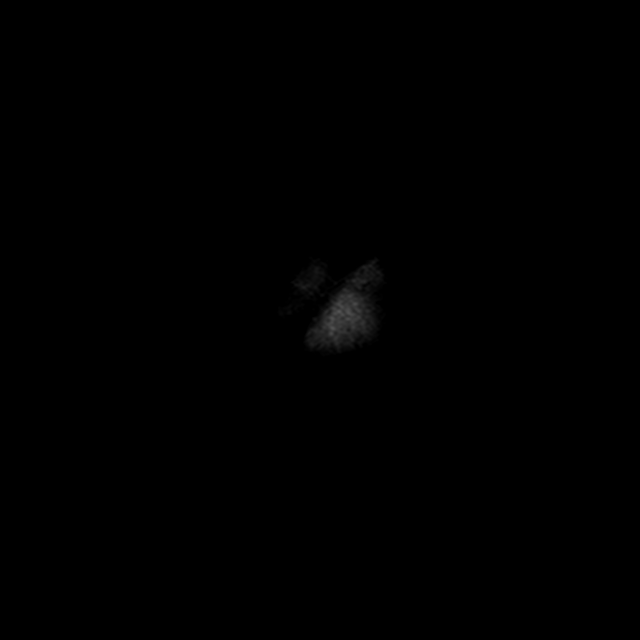

[44 of 48 positions shown; findings below may reference images not displayed]

FINDINGS: Brain: Cerebral volume within normal limits. Patchy T2/FLAIR
hyperintensity involving the periventricular and deep white matter
both cerebral hemispheres most consistent with chronic small vessel
ischemic disease, mild in nature.

No abnormal foci of restricted diffusion to suggest acute or
subacute ischemia. Gray-white matter differentiation maintained. No
encephalomalacia to suggest chronic cortical infarction. No evidence
for acute or chronic intracranial hemorrhage.

No mass lesion, midline shift or mass effect. No hydrocephalus or
extra-axial fluid collection. Pituitary gland suprasellar region
normal. Midline structures intact.

Vascular: Major intracranial vascular flow voids are maintained.
Hypoplastic right vertebral artery noted.

Skull and upper cervical spine: Craniocervical junction normal. Bone
marrow signal intensity within normal limits. No scalp soft tissue
abnormality.

Sinuses/Orbits: Globes and orbital soft tissues within normal
limits. Mild mucosal thickening noted within the ethmoidal air
cells. Paranasal sinuses are otherwise clear. No mastoid effusion.

Other: None.
IMPRESSION: 1. No acute intracranial abnormality.
2. Mild chronic microvascular ischemic disease for age.

## 2023-09-02 ENCOUNTER — Other Ambulatory Visit (HOSPITAL_BASED_OUTPATIENT_CLINIC_OR_DEPARTMENT_OTHER): Payer: Self-pay

## 2023-09-02 DIAGNOSIS — E1169 Type 2 diabetes mellitus with other specified complication: Secondary | ICD-10-CM | POA: Diagnosis not present

## 2023-09-02 DIAGNOSIS — I10 Essential (primary) hypertension: Secondary | ICD-10-CM | POA: Diagnosis not present

## 2023-09-02 DIAGNOSIS — Z6835 Body mass index (BMI) 35.0-35.9, adult: Secondary | ICD-10-CM | POA: Diagnosis not present

## 2023-09-02 DIAGNOSIS — E66812 Obesity, class 2: Secondary | ICD-10-CM | POA: Diagnosis not present

## 2023-09-02 MED ORDER — MOUNJARO 15 MG/0.5ML ~~LOC~~ SOAJ
15.0000 mg | SUBCUTANEOUS | 3 refills | Status: DC
  Filled 2023-09-02: qty 2, 28d supply, fill #0
  Filled 2023-10-14: qty 2, 28d supply, fill #1
  Filled 2023-12-18: qty 2, 28d supply, fill #2
  Filled 2024-01-15: qty 2, 28d supply, fill #3

## 2023-09-04 ENCOUNTER — Other Ambulatory Visit (HOSPITAL_BASED_OUTPATIENT_CLINIC_OR_DEPARTMENT_OTHER): Payer: Self-pay

## 2023-09-10 ENCOUNTER — Other Ambulatory Visit: Payer: Self-pay | Admitting: Obstetrics & Gynecology

## 2023-09-10 DIAGNOSIS — Z1231 Encounter for screening mammogram for malignant neoplasm of breast: Secondary | ICD-10-CM

## 2023-09-16 DIAGNOSIS — M25561 Pain in right knee: Secondary | ICD-10-CM | POA: Diagnosis not present

## 2023-09-16 DIAGNOSIS — M545 Low back pain, unspecified: Secondary | ICD-10-CM | POA: Diagnosis not present

## 2023-09-17 DIAGNOSIS — E119 Type 2 diabetes mellitus without complications: Secondary | ICD-10-CM | POA: Diagnosis not present

## 2023-09-26 ENCOUNTER — Ambulatory Visit

## 2023-10-09 DIAGNOSIS — M25561 Pain in right knee: Secondary | ICD-10-CM | POA: Diagnosis not present

## 2023-10-09 DIAGNOSIS — Z6835 Body mass index (BMI) 35.0-35.9, adult: Secondary | ICD-10-CM | POA: Diagnosis not present

## 2023-10-09 DIAGNOSIS — E66812 Obesity, class 2: Secondary | ICD-10-CM | POA: Diagnosis not present

## 2023-10-09 DIAGNOSIS — E1169 Type 2 diabetes mellitus with other specified complication: Secondary | ICD-10-CM | POA: Diagnosis not present

## 2023-10-09 DIAGNOSIS — I10 Essential (primary) hypertension: Secondary | ICD-10-CM | POA: Diagnosis not present

## 2023-10-15 ENCOUNTER — Other Ambulatory Visit (HOSPITAL_BASED_OUTPATIENT_CLINIC_OR_DEPARTMENT_OTHER): Payer: Self-pay

## 2023-11-13 ENCOUNTER — Other Ambulatory Visit: Payer: Self-pay

## 2023-11-13 ENCOUNTER — Other Ambulatory Visit (HOSPITAL_BASED_OUTPATIENT_CLINIC_OR_DEPARTMENT_OTHER): Payer: Self-pay

## 2023-11-13 DIAGNOSIS — N3020 Other chronic cystitis without hematuria: Secondary | ICD-10-CM | POA: Diagnosis not present

## 2023-11-13 DIAGNOSIS — R399 Unspecified symptoms and signs involving the genitourinary system: Secondary | ICD-10-CM | POA: Diagnosis not present

## 2023-11-15 ENCOUNTER — Ambulatory Visit (HOSPITAL_BASED_OUTPATIENT_CLINIC_OR_DEPARTMENT_OTHER): Admitting: Obstetrics & Gynecology

## 2023-11-18 ENCOUNTER — Encounter (HOSPITAL_BASED_OUTPATIENT_CLINIC_OR_DEPARTMENT_OTHER): Payer: Self-pay | Admitting: Obstetrics & Gynecology

## 2023-11-18 ENCOUNTER — Other Ambulatory Visit (HOSPITAL_COMMUNITY)
Admission: RE | Admit: 2023-11-18 | Discharge: 2023-11-18 | Disposition: A | Discharge status: Home / Self Care | Source: Ambulatory Visit | Attending: Obstetrics & Gynecology | Admitting: Obstetrics & Gynecology

## 2023-11-18 ENCOUNTER — Ambulatory Visit (HOSPITAL_BASED_OUTPATIENT_CLINIC_OR_DEPARTMENT_OTHER): Admitting: Obstetrics & Gynecology

## 2023-11-18 VITALS — BP 121/67 | HR 67 | Wt 198.0 lb

## 2023-11-18 DIAGNOSIS — Z124 Encounter for screening for malignant neoplasm of cervix: Secondary | ICD-10-CM | POA: Insufficient documentation

## 2023-11-18 DIAGNOSIS — Z9189 Other specified personal risk factors, not elsewhere classified: Secondary | ICD-10-CM

## 2023-11-18 DIAGNOSIS — E119 Type 2 diabetes mellitus without complications: Secondary | ICD-10-CM | POA: Diagnosis not present

## 2023-11-18 DIAGNOSIS — Z87440 Personal history of urinary (tract) infections: Secondary | ICD-10-CM

## 2023-11-18 DIAGNOSIS — G459 Transient cerebral ischemic attack, unspecified: Secondary | ICD-10-CM | POA: Diagnosis not present

## 2023-11-18 NOTE — Progress Notes (Signed)
 Breast and Pelvic Exam Patient name: Christina Wong MRN [id number]  Date of birth: 1950/10/06 Chief Complaint:   Gynecologic Exam (Pt reports no issues or concerns today. )  History of Present Illness:   Christina Wong is a 73 y.o. G1P1 Caucasian female being seen today for breast and pelvic exam.  Has no concerns today.  Son and his family are in [place].  Denies vaginal bleeding.    PCP:  Therisa Holm  Also seeing provider with weight management at [facility]  Is on Mounjaro .     H/o UTI, recurrent.  Saw urology and was on antibiotic for about 6 months for prevention.  She's just recently stopped this per urologist guidelines.  She is using vaginal estradiol cream twice weekly.    Went to [place] for 3 weeks.  Was a good trip.    Patient's last menstrual period was 02/05/2006.   Last pap 02/15/2021. Results were: NILM w/ HRHPV not done. H/O abnormal pap: no Last mammogram: 09/18/2022. Results were: normal. Patient is scheduled for next one tomorrow, 11/19/2023. Family h/o breast cancer: yes maternal aunt. Last colonoscopy: Patient reports that she had one done in the last year with Dr. Kristie that was normal. Family h/o colorectal cancer: no     11/18/2023    3:20 PM 02/15/2021    2:05 PM  Depression screen PHQ 2/9  Decreased Interest 0 0  Down, Depressed, Hopeless 0 0  PHQ - 2 Score 0 0    Review of Systems:   Pertinent items are noted in HPI Denies any urinary or bowel changes.  Denies pelvic pain.  Pertinent History Reviewed:  Reviewed past medical,surgical, social and family history.  Reviewed problem list, medications and allergies. Physical Assessment:   Vitals:   11/18/23 1511  BP: 121/67  Pulse: 67  SpO2: 100%  Weight: 198 lb (89.8 kg)  Body mass index is 36.21 kg/m.        Physical Examination:   General appearance - well appearing, and in no distress  Mental status - alert, oriented to person, place, and time  Psych:  She has a normal mood and  affect  Skin - warm and dry, normal color, no suspicious lesions noted  Chest - effort normal, all lung fields clear to auscultation bilaterally  Heart - normal rate and regular rhythm  Neck:  midline trachea, no thyromegaly or nodules  Breasts - breasts appear normal, no suspicious masses, no skin or nipple changes or  axillary nodes  Abdomen - soft, nontender, nondistended, no masses or organomegaly  Pelvic - VULVA: normal appearing vulva with no masses, tenderness or lesions   VAGINA: normal appearing vagina with normal color and discharge, no lesions   CERVIX: normal appearing cervix without discharge or lesions, no CMT  Thin prep pap is   UTERUS: uterus is felt to be normal size, shape, consistency and nontender   ADNEXA: No adnexal masses or tenderness noted.  Rectal - normal rectal, good sphincter tone, no masses felt.  Extremities:  No swelling or varicosities noted  Chaperone present for exam  No results found for this or any previous visit (from the past 24 hours).  Assessment & Plan:  1. GYN exam for high-risk Medicare patient (Primary) - Pap smear updated today - Mammogram scheduled for tomorrow - Colonoscopy release will be singed - Bone mineral density 2024 - lab work done with PCP, Therisa Holm - vaccines reviewed/updated  2. Cervical cancer screening - Cytology - PAP( [facility]) -  PR OBTAINING SCREEN PAP SMEAR  3. TIA (transient ischemic attack) - on baby asa  4. Type 2 diabetes mellitus without complication, without long-term current use of insulin  (HCC) - On metformin  and Mounjaro   5. History of recurrent UTI (urinary tract infection) - has seen urology and is currently using vaginal estradiol cream   Orders Placed This Encounter  Procedures   PR OBTAINING SCREEN PAP SMEAR    Meds: No orders of the defined types were placed in this encounter.   Follow-up: Return in about 2 years (around 11/17/2025).  Ronal GORMAN Pinal, MD 11/18/2023 3:53 PM

## 2023-11-19 ENCOUNTER — Ambulatory Visit
Admission: RE | Admit: 2023-11-19 | Discharge: 2023-11-19 | Disposition: A | Discharge status: Home / Self Care | Source: Ambulatory Visit | Attending: Obstetrics & Gynecology | Admitting: Obstetrics & Gynecology

## 2023-11-19 DIAGNOSIS — Z1231 Encounter for screening mammogram for malignant neoplasm of breast: Secondary | ICD-10-CM

## 2023-11-20 LAB — CYTOLOGY - PAP
Adequacy: ABSENT
Diagnosis: NEGATIVE

## 2023-11-21 ENCOUNTER — Ambulatory Visit (HOSPITAL_BASED_OUTPATIENT_CLINIC_OR_DEPARTMENT_OTHER): Payer: Self-pay | Admitting: Obstetrics & Gynecology

## 2023-12-06 DIAGNOSIS — Z0000 Encounter for general adult medical examination without abnormal findings: Secondary | ICD-10-CM | POA: Diagnosis not present

## 2023-12-06 DIAGNOSIS — D692 Other nonthrombocytopenic purpura: Secondary | ICD-10-CM | POA: Diagnosis not present

## 2023-12-06 DIAGNOSIS — E1136 Type 2 diabetes mellitus with diabetic cataract: Secondary | ICD-10-CM | POA: Diagnosis not present

## 2023-12-06 DIAGNOSIS — G479 Sleep disorder, unspecified: Secondary | ICD-10-CM | POA: Diagnosis not present

## 2023-12-06 DIAGNOSIS — E1169 Type 2 diabetes mellitus with other specified complication: Secondary | ICD-10-CM | POA: Diagnosis not present

## 2023-12-06 DIAGNOSIS — E7800 Pure hypercholesterolemia, unspecified: Secondary | ICD-10-CM | POA: Diagnosis not present

## 2023-12-06 DIAGNOSIS — Z1331 Encounter for screening for depression: Secondary | ICD-10-CM | POA: Diagnosis not present

## 2023-12-06 DIAGNOSIS — Z23 Encounter for immunization: Secondary | ICD-10-CM | POA: Diagnosis not present

## 2023-12-06 DIAGNOSIS — M5416 Radiculopathy, lumbar region: Secondary | ICD-10-CM | POA: Diagnosis not present

## 2023-12-06 DIAGNOSIS — G43809 Other migraine, not intractable, without status migrainosus: Secondary | ICD-10-CM | POA: Diagnosis not present

## 2023-12-06 DIAGNOSIS — I10 Essential (primary) hypertension: Secondary | ICD-10-CM | POA: Diagnosis not present

## 2023-12-13 ENCOUNTER — Other Ambulatory Visit (HOSPITAL_BASED_OUTPATIENT_CLINIC_OR_DEPARTMENT_OTHER): Payer: Self-pay | Admitting: Family Medicine

## 2023-12-13 DIAGNOSIS — E2839 Other primary ovarian failure: Secondary | ICD-10-CM

## 2023-12-18 ENCOUNTER — Other Ambulatory Visit (HOSPITAL_BASED_OUTPATIENT_CLINIC_OR_DEPARTMENT_OTHER): Payer: Self-pay

## 2024-01-15 ENCOUNTER — Other Ambulatory Visit (HOSPITAL_BASED_OUTPATIENT_CLINIC_OR_DEPARTMENT_OTHER): Payer: Self-pay

## 2024-01-15 DIAGNOSIS — H401131 Primary open-angle glaucoma, bilateral, mild stage: Secondary | ICD-10-CM | POA: Diagnosis not present

## 2024-02-23 ENCOUNTER — Other Ambulatory Visit (HOSPITAL_BASED_OUTPATIENT_CLINIC_OR_DEPARTMENT_OTHER): Payer: Self-pay

## 2024-02-25 ENCOUNTER — Other Ambulatory Visit (HOSPITAL_BASED_OUTPATIENT_CLINIC_OR_DEPARTMENT_OTHER): Payer: Self-pay

## 2024-02-25 MED ORDER — MOUNJARO 15 MG/0.5ML ~~LOC~~ SOAJ
15.0000 mg | SUBCUTANEOUS | 3 refills | Status: AC
  Filled 2024-02-25: qty 2, 28d supply, fill #0
